# Patient Record
Sex: Female | Born: 1970 | Race: White | Hispanic: No | Marital: Married | Smoking: Never smoker
Health system: Southern US, Community
[De-identification: ages and names within clinical notes are randomized; demographics above are authoritative.]

## PROBLEM LIST (undated history)

## (undated) DIAGNOSIS — F329 Major depressive disorder, single episode, unspecified: Secondary | ICD-10-CM

## (undated) DIAGNOSIS — R0602 Shortness of breath: Secondary | ICD-10-CM

## (undated) DIAGNOSIS — E119 Type 2 diabetes mellitus without complications: Secondary | ICD-10-CM

## (undated) DIAGNOSIS — R5383 Other fatigue: Secondary | ICD-10-CM

## (undated) DIAGNOSIS — F419 Anxiety disorder, unspecified: Secondary | ICD-10-CM

## (undated) DIAGNOSIS — J45909 Unspecified asthma, uncomplicated: Secondary | ICD-10-CM

## (undated) DIAGNOSIS — C50919 Malignant neoplasm of unspecified site of unspecified female breast: Secondary | ICD-10-CM

## (undated) DIAGNOSIS — N649 Disorder of breast, unspecified: Secondary | ICD-10-CM

## (undated) DIAGNOSIS — N898 Other specified noninflammatory disorders of vagina: Secondary | ICD-10-CM

## (undated) DIAGNOSIS — F32A Depression, unspecified: Secondary | ICD-10-CM

## (undated) DIAGNOSIS — R0689 Other abnormalities of breathing: Secondary | ICD-10-CM

## (undated) DIAGNOSIS — D649 Anemia, unspecified: Secondary | ICD-10-CM

## (undated) DIAGNOSIS — I890 Lymphedema, not elsewhere classified: Secondary | ICD-10-CM

## (undated) DIAGNOSIS — Z9289 Personal history of other medical treatment: Secondary | ICD-10-CM

## (undated) DIAGNOSIS — M8580 Other specified disorders of bone density and structure, unspecified site: Secondary | ICD-10-CM

## (undated) HISTORY — DX: Anxiety disorder, unspecified: F41.9

## (undated) HISTORY — PX: BREAST RECONSTRUCTION: SHX9

## (undated) HISTORY — DX: Other abnormalities of breathing: R06.89

## (undated) HISTORY — DX: Depression, unspecified: F32.A

## (undated) HISTORY — DX: Other specified noninflammatory disorders of vagina: N89.8

## (undated) HISTORY — DX: Lymphedema, not elsewhere classified: I89.0

## (undated) HISTORY — DX: Other fatigue: R53.83

## (undated) HISTORY — DX: Shortness of breath: R06.02

## (undated) HISTORY — DX: Personal history of other medical treatment: Z92.89

## (undated) HISTORY — DX: Disorder of breast, unspecified: N64.9

## (undated) HISTORY — PX: CHOLECYSTECTOMY: SHX55

## (undated) HISTORY — PX: BROW LIFT: SHX178

## (undated) HISTORY — DX: Major depressive disorder, single episode, unspecified: F32.9

## (undated) HISTORY — DX: Anemia, unspecified: D64.9

## (undated) HISTORY — DX: Other specified disorders of bone density and structure, unspecified site: M85.80

## (undated) HISTORY — PX: WRIST SURGERY: SHX841

## (undated) HISTORY — PX: ABDOMINAL HYSTERECTOMY: SHX81

---

## 2010-09-14 ENCOUNTER — Emergency Department (HOSPITAL_COMMUNITY)
Admission: EM | Admit: 2010-09-14 | Discharge: 2010-09-14 | Payer: Self-pay | Source: Home / Self Care | Admitting: Emergency Medicine

## 2011-12-22 ENCOUNTER — Ambulatory Visit (INDEPENDENT_AMBULATORY_CARE_PROVIDER_SITE_OTHER): Payer: No Typology Code available for payment source

## 2011-12-22 DIAGNOSIS — M542 Cervicalgia: Secondary | ICD-10-CM

## 2012-01-09 ENCOUNTER — Other Ambulatory Visit: Payer: Self-pay | Admitting: Obstetrics and Gynecology

## 2012-01-09 DIAGNOSIS — Z1231 Encounter for screening mammogram for malignant neoplasm of breast: Secondary | ICD-10-CM

## 2012-01-29 ENCOUNTER — Ambulatory Visit
Admission: RE | Admit: 2012-01-29 | Discharge: 2012-01-29 | Disposition: A | Discharge status: Home / Self Care | Payer: BC Managed Care – PPO | Source: Ambulatory Visit | Attending: Obstetrics and Gynecology | Admitting: Obstetrics and Gynecology

## 2012-01-29 DIAGNOSIS — Z1231 Encounter for screening mammogram for malignant neoplasm of breast: Secondary | ICD-10-CM

## 2012-12-03 ENCOUNTER — Encounter (HOSPITAL_COMMUNITY): Payer: Self-pay | Admitting: Adult Health

## 2012-12-03 ENCOUNTER — Emergency Department (HOSPITAL_COMMUNITY)
Admission: EM | Admit: 2012-12-03 | Discharge: 2012-12-04 | Disposition: A | Discharge status: Home / Self Care | Payer: BC Managed Care – PPO | Attending: Emergency Medicine | Admitting: Emergency Medicine

## 2012-12-03 ENCOUNTER — Emergency Department (HOSPITAL_COMMUNITY): Payer: BC Managed Care – PPO

## 2012-12-03 DIAGNOSIS — M549 Dorsalgia, unspecified: Secondary | ICD-10-CM | POA: Insufficient documentation

## 2012-12-03 DIAGNOSIS — R630 Anorexia: Secondary | ICD-10-CM | POA: Insufficient documentation

## 2012-12-03 DIAGNOSIS — B9789 Other viral agents as the cause of diseases classified elsewhere: Secondary | ICD-10-CM | POA: Insufficient documentation

## 2012-12-03 DIAGNOSIS — IMO0001 Reserved for inherently not codable concepts without codable children: Secondary | ICD-10-CM | POA: Insufficient documentation

## 2012-12-03 DIAGNOSIS — R110 Nausea: Secondary | ICD-10-CM | POA: Insufficient documentation

## 2012-12-03 DIAGNOSIS — R05 Cough: Secondary | ICD-10-CM | POA: Insufficient documentation

## 2012-12-03 DIAGNOSIS — R0602 Shortness of breath: Secondary | ICD-10-CM | POA: Insufficient documentation

## 2012-12-03 DIAGNOSIS — B349 Viral infection, unspecified: Secondary | ICD-10-CM

## 2012-12-03 DIAGNOSIS — R059 Cough, unspecified: Secondary | ICD-10-CM | POA: Insufficient documentation

## 2012-12-03 DIAGNOSIS — R42 Dizziness and giddiness: Secondary | ICD-10-CM | POA: Insufficient documentation

## 2012-12-03 LAB — CBC
HCT: 41.5 % (ref 36.0–46.0)
Platelets: 300 10*3/uL (ref 150–400)
RBC: 4.4 MIL/uL (ref 3.87–5.11)
RDW: 11.9 % (ref 11.5–15.5)
WBC: 8.6 10*3/uL (ref 4.0–10.5)

## 2012-12-03 LAB — BASIC METABOLIC PANEL
BUN: 16 mg/dL (ref 6–23)
CO2: 23 mEq/L (ref 19–32)
Chloride: 99 mEq/L (ref 96–112)
GFR calc Af Amer: >90 mL/min (ref >90)
Potassium: 3.6 mEq/L (ref 3.5–5.1)

## 2012-12-03 LAB — POCT I-STAT TROPONIN I: Troponin i, poc: 0 ng/mL (ref 0.00–0.08)

## 2012-12-03 MED ORDER — ONDANSETRON HCL 4 MG/2ML IJ SOLN
4.0000 mg | Freq: Once | INTRAMUSCULAR | Status: AC
  Administered 2012-12-03: 4 mg via INTRAVENOUS
  Filled 2012-12-03: qty 2

## 2012-12-03 MED ORDER — KETOROLAC TROMETHAMINE 30 MG/ML IJ SOLN
30.0000 mg | Freq: Once | INTRAMUSCULAR | Status: AC
  Administered 2012-12-03: 30 mg via INTRAVENOUS
  Filled 2012-12-03: qty 1

## 2012-12-03 MED ORDER — OXYCODONE-ACETAMINOPHEN 5-325 MG PO TABS: 1.0000 | ORAL_TABLET | Freq: Four times a day (QID) | ORAL | Status: DC | PRN

## 2012-12-03 MED ORDER — MORPHINE SULFATE 4 MG/ML IJ SOLN
4.0000 mg | Freq: Once | INTRAMUSCULAR | Status: AC
  Administered 2012-12-03: 4 mg via INTRAVENOUS
  Filled 2012-12-03: qty 1

## 2012-12-03 MED ORDER — ONDANSETRON 8 MG PO TBDP: 8.0000 mg | ORAL_TABLET | Freq: Three times a day (TID) | ORAL | Status: DC | PRN

## 2012-12-03 MED ORDER — SODIUM CHLORIDE 0.9 % IV BOLUS (SEPSIS)
1000.0000 mL | Freq: Once | INTRAVENOUS | Status: AC
  Administered 2012-12-03: 1000 mL via INTRAVENOUS

## 2012-12-03 NOTE — ED Provider Notes (Signed)
History     CSN: [id number]  Arrival date & time 12/03/12  1911   First MD Initiated Contact with Patient 12/03/12 2135      Chief Complaint  Patient presents with  . Chest Pain    (Consider location/radiation/quality/duration/timing/severity/associated sxs/prior treatment) The history is provided by the patient.   patient's had pain in her upper back began on Thursday. She also has some upper chest pressure that began last night. She's felt lightheaded dizzy and has had coughing and nausea. She has not vomited. She has had decreased appetite. The chest pain does come and go. She was seen at an urgent care was sent here for further evaluation. She states that she feels bad all over. He feels as if she is just walking in space. No clear sick contacts. She's otherwise healthy. She does not smoke.  History reviewed. No pertinent past medical history.  History reviewed. No pertinent past surgical history.  History reviewed. No pertinent family history.  History  Substance Use Topics  . Smoking status: Never Smoker   . Smokeless tobacco: Not on file  . Alcohol Use: Yes    OB History    Grav Para Term Preterm Abortions TAB SAB Ect Mult Living                  Review of Systems  Constitutional: Positive for activity change and appetite change.  HENT: Negative for neck stiffness.   Eyes: Negative for pain.  Respiratory: Positive for shortness of breath. Negative for chest tightness.   Cardiovascular: Positive for chest pain. Negative for leg swelling.  Gastrointestinal: Positive for nausea. Negative for vomiting, abdominal pain and diarrhea.  Genitourinary: Negative for flank pain.  Musculoskeletal: Positive for myalgias and back pain.  Skin: Negative for rash.  Neurological: Negative for weakness, numbness and headaches.  Psychiatric/Behavioral: Negative for behavioral problems.    Allergies  Review of patient's allergies indicates no known allergies.  Home Medications     Current Outpatient Rx  Name  Route  Sig  Dispense  Refill  . ONDANSETRON 8 MG PO TBDP   Oral   Take 1 tablet (8 mg total) by mouth every 8 (eight) hours as needed for nausea.   20 tablet   0   . OXYCODONE-ACETAMINOPHEN 5-325 MG PO TABS   Oral   Take 1-2 tablets by mouth every 6 (six) hours as needed for pain.   10 tablet   0     BP 107/62  Pulse 97  Temp 98.4 F (36.9 C) (Oral)  Resp 16  Ht 5\' 2"  (1.575 m)  Wt 152 lb (68.947 kg)  BMI 27.80 kg/m2  SpO2 100%  Physical Exam  Nursing note and vitals reviewed. Constitutional: She is oriented to person, place, and time. She appears well-developed and well-nourished.       Patient appears somewhat uncomfortable.  HENT:  Head: Normocephalic and atraumatic.  Eyes: EOM are normal. Pupils are equal, round, and reactive to light.  Neck: Normal range of motion. Neck supple.  Cardiovascular: Regular rhythm and normal heart sounds.   No murmur heard.      Tachycardia  Pulmonary/Chest: Effort normal and breath sounds normal. No respiratory distress. She has no wheezes. She has no rales.  Abdominal: Soft. Bowel sounds are normal. She exhibits no distension. There is tenderness. There is no rebound and no guarding.       Mild epigastric tenderness without rebound or guarding. No right upper quadrant tenderness.  Musculoskeletal: Normal range  of motion.  Neurological: She is alert and oriented to person, place, and time. No cranial nerve deficit.  Skin: Skin is warm and dry.  Psychiatric: She has a normal mood and affect. Her speech is normal.    ED Course  Procedures (including critical care time)  Labs Reviewed  BASIC METABOLIC PANEL - Abnormal; Notable for the following:    Sodium 132 (*)     GFR calc non Af Amer 81 (*)     All other components within normal limits  CBC  POCT I-STAT TROPONIN I   Dg Chest 2 View  12/03/2012  *RADIOLOGY REPORT*  Clinical Data: Chest pain  CHEST - 2 VIEW  Comparison: None.  Findings:  Cardiomediastinal silhouette is unremarkable.  No acute infiltrate or pleural effusion.  No pulmonary edema.  Bony thorax is unremarkable.  IMPRESSION: No active disease.   Original Report Authenticated By: Natasha Mead, M.D.      1. Viral syndrome      Date: 12/03/2012  Rate: 99  Rhythm: normal sinus rhythm  QRS Axis: normal  Intervals: normal  ST/T Wave abnormalities: normal  Conduction Disutrbances: none  Narrative Interpretation: unremarkable     MDM  Patient with multiple complaints. Chest pain pressure myalgias lightheadedness dizziness cough nausea anorexia. Chest pain as squeezing and comes and goes. It is nonpleuritic. She is not on exogenous hormones and doesn't smoke. She has a mild tachycardia that has improved with IV fluids. She states she feels somewhat better after the first liter and the centimeters running at this time. Sodium was minimally low. Pending this is likely a start of a viral syndrome. EKG is reassuring I doubt cardiac cause. I doubt pulmonary embolism. She'll likely be discharged home and cares been turned over to Dr. Rulon Abide.        Juliet Rude. Rubin Payor, MD 12/03/12 [phone]

## 2012-12-03 NOTE — ED Notes (Addendum)
Presents with  Onset of back pain Thursday,  Sternal Chest pain and pressure that began last night associated with right arm tingling, diphoretic, lightheadedness, dizziness,cough, nausea and anorexia. Chest pain described as squeezing and intermittent. Pain began while in bed resting.  Today went to [facility] and sent here for further work up. The chest pain is not as severe today, but pt states, "I just don't feel good. Sometimes I feel like I am just walking in space."

## 2012-12-04 NOTE — ED Notes (Signed)
The patient is AOx4 and comfortable with her discharge instructions. 

## 2013-06-18 ENCOUNTER — Ambulatory Visit (INDEPENDENT_AMBULATORY_CARE_PROVIDER_SITE_OTHER): Payer: BC Managed Care – PPO | Admitting: Emergency Medicine

## 2013-06-18 ENCOUNTER — Ambulatory Visit: Payer: BC Managed Care – PPO

## 2013-06-18 VITALS — BP 120/80 | HR 84 | Temp 98.0°F | Resp 16 | Ht 62.0 in | Wt 185.0 lb

## 2013-06-18 DIAGNOSIS — M25561 Pain in right knee: Secondary | ICD-10-CM

## 2013-06-18 MED ORDER — HYDROCODONE-ACETAMINOPHEN 5-325 MG PO TABS: 1.0000 | ORAL_TABLET | ORAL | Status: DC | PRN

## 2013-06-18 NOTE — Progress Notes (Signed)
[facility] [address], [place] [address] [phone]  Date:  06/18/2013   Name:  Carrie Richards   DOB:  [birth year]   MRN:  [id number]  PCP:  Default, Provider, MD    Chief Complaint: Knee Pain   History of Present Illness:  Carrie Richards is a 42 y.o. very pleasant female patient who presents with the following:  Injured stepping into her closet and developed immediate pain in her medial knee and posterior knee.  Started three days ago and has persisted despite home remedies.  Swollen.  Not able to comfortably bear weight.  No improvement with over the counter medications or other home remedies. Denies other complaint or health concern today.   There are no active problems to display for this patient.   History reviewed. No pertinent past medical history.  Past Surgical History  Procedure Laterality Date  . Cesarean section    . Cholecystectomy    . Abdominal hysterectomy    . Wrist surgery Right     History  Substance Use Topics  . Smoking status: Never Smoker   . Smokeless tobacco: Not on file  . Alcohol Use: Yes    Family History  Problem Relation Age of Onset  . Diabetes Mother   . Cancer Mother   . Melanoma Father   . Hypertension Father   . Hyperlipidemia Father     No Known Allergies  Medication list has been reviewed and updated.  Current Outpatient Prescriptions on File Prior to Visit  Medication Sig Dispense Refill  . ondansetron (ZOFRAN-ODT) 8 MG disintegrating tablet Take 1 tablet (8 mg total) by mouth every 8 (eight) hours as needed for nausea.  20 tablet  0  . oxyCODONE-acetaminophen (PERCOCET/ROXICET) 5-325 MG per tablet Take 1-2 tablets by mouth every 6 (six) hours as needed for pain.  10 tablet  0   No current facility-administered medications on file prior to visit.    Review of Systems:  As per HPI, otherwise negative.    Physical Examination: Filed Vitals:   06/18/13 1935  BP: 120/80  Pulse: 84  Temp:  98 F (36.7 C)  Resp: 16   Filed Vitals:   06/18/13 1935  Height: 5\' 2"  (1.575 m)  Weight: 185 lb (83.915 kg)   Body mass index is 33.83 kg/(m^2). Ideal Body Weight: Weight in (lb) to have BMI = 25: 136.4   GEN: WDWN, NAD, Non-toxic, Alert & Oriented x 3 HEENT: Atraumatic, Normocephalic.  Ears and Nose: No external deformity. EXTR: No clubbing/cyanosis/edema NEURO: Normal gait.  PSYCH: Normally interactive. Conversant. Not depressed or anxious appearing.  Calm demeanor.  RIGHT knee:  Tender medial knee joint line and posteriorly in her distal thigh  Not able to extend fully.  Pain with flexion to 90 degrees.    Assessment and Plan: Knee injury Crutches Immobilizer   Signed,  Phillips Odor, MD   [facility] reading (PRIMARY) by  Dr. Dareen Piano.  Negative knee.

## 2013-11-20 ENCOUNTER — Ambulatory Visit (INDEPENDENT_AMBULATORY_CARE_PROVIDER_SITE_OTHER): Payer: BC Managed Care – PPO | Admitting: Internal Medicine

## 2013-11-20 VITALS — BP 120/80 | HR 83 | Temp 98.1°F | Resp 16 | Ht 62.0 in | Wt 182.0 lb

## 2013-11-20 DIAGNOSIS — S39012A Strain of muscle, fascia and tendon of lower back, initial encounter: Secondary | ICD-10-CM

## 2013-11-20 DIAGNOSIS — IMO0002 Reserved for concepts with insufficient information to code with codable children: Secondary | ICD-10-CM

## 2013-11-20 DIAGNOSIS — Z6833 Body mass index (BMI) 33.0-33.9, adult: Secondary | ICD-10-CM

## 2013-11-20 MED ORDER — CYCLOBENZAPRINE HCL 10 MG PO TABS: 10.0000 mg | ORAL_TABLET | Freq: Every day | ORAL | Status: DC

## 2013-11-20 MED ORDER — TRAMADOL HCL 50 MG PO TABS: ORAL_TABLET | ORAL | Status: DC

## 2013-11-20 MED ORDER — MELOXICAM 15 MG PO TABS: 15.0000 mg | ORAL_TABLET | Freq: Every day | ORAL | Status: DC

## 2013-11-20 NOTE — Progress Notes (Signed)
Subjective:    Patient ID: Carrie Richards, female    DOB: 06-19-1971, 42 y.o.   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 cyclobenzaprine (FLEXERIL) 10 MG tablet    Sig: Take 1 tablet (10 mg total) by mouth at bedtime.    Dispense:  20 tablet    Refill:  0  . meloxicam (MOBIC) 15 MG tablet    Sig: Take 1 tablet (15 mg total) by mouth daily.    Dispense:  30 tablet    Refill:  0  . traMADol (ULTRAM) 50 MG tablet    Sig: 2 tabs at bedtime if needed for pain    Dispense:  30 tablet    Refill:  0   Stretching exercises Needs weight loss

## 2014-05-31 ENCOUNTER — Ambulatory Visit (INDEPENDENT_AMBULATORY_CARE_PROVIDER_SITE_OTHER): Payer: BC Managed Care – PPO | Admitting: Physician Assistant

## 2014-05-31 VITALS — BP 126/72 | HR 91 | Temp 98.0°F | Resp 20 | Ht 63.25 in | Wt 187.2 lb

## 2014-05-31 DIAGNOSIS — S139XXA Sprain of joints and ligaments of unspecified parts of neck, initial encounter: Secondary | ICD-10-CM

## 2014-05-31 DIAGNOSIS — M62838 Other muscle spasm: Secondary | ICD-10-CM

## 2014-05-31 DIAGNOSIS — S161XXA Strain of muscle, fascia and tendon at neck level, initial encounter: Secondary | ICD-10-CM

## 2014-05-31 MED ORDER — CYCLOBENZAPRINE HCL 10 MG PO TABS: 10.0000 mg | ORAL_TABLET | Freq: Three times a day (TID) | ORAL | Status: DC | PRN

## 2014-05-31 MED ORDER — HYDROCODONE-ACETAMINOPHEN 5-325 MG PO TABS: 1.0000 | ORAL_TABLET | Freq: Three times a day (TID) | ORAL | Status: DC

## 2014-05-31 MED ORDER — DICLOFENAC SODIUM 75 MG PO TBEC: 75.0000 mg | DELAYED_RELEASE_TABLET | Freq: Two times a day (BID) | ORAL | Status: DC

## 2014-05-31 NOTE — Progress Notes (Signed)
Subjective:    Patient ID: Carrie Richards, female    DOB: 07-01-1971, 43 y.o.   MRN: [id number]  HPI Pt presents to clinic with left sided thoracic back pain for the last 3 days and left sided neck pain that started this morning. She has not had a injury that she is aware of.  She has not been sleeping great because of stress.  She has tried high dose motrin without much help.  She has h/o bulging lumbar discs that were helped with flexeril and she tolerated that well.  She has tried some heat and massage but it really hurts to turn her neck.  She is having no paresthesias in her L arm/hand.  OTC meds - ibuprofen 800mg  3x/day Right handed  Review of Systems     Objective:   Physical Exam  Vitals reviewed. Constitutional: She is oriented to person, place, and time. She appears well-developed and well-nourished.  HENT:  Head: Normocephalic and atraumatic.  Right Ear: External ear normal.  Left Ear: External ear normal.  Pulmonary/Chest: Effort normal.  Musculoskeletal:       Cervical back: She exhibits decreased range of motion (full flexion and extension but decrease rotation of neck 2nd to pain), tenderness, bony tenderness (mild cervical vertebra TTP C5-C7) and spasm (left sided).       Thoracic back: She exhibits spasm (left trapezius). She exhibits normal range of motion, no tenderness and no bony tenderness.  Neurological: She is alert and oriented to person, place, and time. She has normal strength. No cranial nerve deficit or sensory deficit.  Skin: Skin is warm and dry.  Psychiatric: She has a normal mood and affect. Her behavior is normal. Judgment and thought content normal.      Assessment & Plan:  Cervical strain, acute, initial encounter - Plan: diclofenac (VOLTAREN) 75 MG EC tablet, HYDROcodone-acetaminophen (NORCO/VICODIN) 5-325 MG per tablet  Trapezius muscle spasm - Plan: cyclobenzaprine (FLEXERIL) 10 MG tablet  Heat and stretches - she will f/u if there is no  improvement in 1 week or worsening of symptoms before then  Benny Lennert PA-C  [facility] 05/31/2014 9:18 PM

## 2014-07-26 ENCOUNTER — Ambulatory Visit (INDEPENDENT_AMBULATORY_CARE_PROVIDER_SITE_OTHER): Payer: BC Managed Care – PPO | Admitting: Family Medicine

## 2014-07-26 VITALS — BP 106/76 | HR 86 | Temp 97.7°F | Resp 20 | Ht 62.0 in | Wt 186.4 lb

## 2014-07-26 DIAGNOSIS — R300 Dysuria: Secondary | ICD-10-CM

## 2014-07-26 DIAGNOSIS — N3090 Cystitis, unspecified without hematuria: Secondary | ICD-10-CM

## 2014-07-26 LAB — POCT URINALYSIS DIPSTICK
BILIRUBIN UA: NEGATIVE
Glucose, UA: NEGATIVE
KETONES UA: NEGATIVE
NITRITE UA: NEGATIVE
PH UA: 5
Protein, UA: NEGATIVE
Urobilinogen, UA: 0.2

## 2014-07-26 LAB — POCT UA - MICROSCOPIC ONLY
CASTS, UR, LPF, POC: NEGATIVE
CRYSTALS, UR, HPF, POC: NEGATIVE
YEAST UA: NEGATIVE

## 2014-07-26 MED ORDER — PHENAZOPYRIDINE HCL 200 MG PO TABS: 200.0000 mg | ORAL_TABLET | Freq: Three times a day (TID) | ORAL | Status: DC | PRN

## 2014-07-26 MED ORDER — NITROFURANTOIN MONOHYD MACRO 100 MG PO CAPS: 100.0000 mg | ORAL_CAPSULE | Freq: Two times a day (BID) | ORAL | Status: DC

## 2014-07-26 NOTE — Progress Notes (Signed)
Subjective:    Patient ID: Carrie Richards, female    DOB: [birth year], 43 y.o.   MRN: [id number]  HPI Patient presents today with 1 day history of lower abdominal pain/pressure. Has had UTI in past. Last one 3-4 months ago. Years ago had frequent UTIs and had to see urology and was on maintenance antibiotics for several years. Watches her caffeine intake and urinates after intercourse.   Had thick white discharge last week for a couple of days. Resolved spontaneously. No itching. No odor.   Review of Systems +back pain, - fever/chills, no nausea/no vomiting    Objective:   Physical Exam  Vitals reviewed. Constitutional: She is oriented to person, place, and time. She appears well-developed and well-nourished.  HENT:  Head: Normocephalic and atraumatic.  Eyes: Conjunctivae are normal.  Neck: Normal range of motion. Neck supple.  Cardiovascular: Normal rate, regular rhythm and normal heart sounds.   Pulmonary/Chest: Effort normal and breath sounds normal.  Abdominal: Soft. Bowel sounds are normal. She exhibits no distension and no mass. There is tenderness in the suprapubic area. There is CVA tenderness. There is no rebound and no guarding.  Musculoskeletal: Normal range of motion.  Neurological: She is alert and oriented to person, place, and time.  Skin: Skin is warm and dry.  Psychiatric: She has a normal mood and affect. Her behavior is normal. Judgment and thought content normal.   -Patient reports it is not uncommon for her to have back pain and tenderness with cystitis.  Results for orders placed in visit on 07/26/14  POCT URINALYSIS DIPSTICK      Result Value Ref Range   Color, UA yellow     Clarity, UA cloudy     Glucose, UA neg     Bilirubin, UA neg     Ketones, UA neg     Spec Grav, UA <=1.005     Blood, UA mod     pH, UA 5.0     Protein, UA neg     Urobilinogen, UA 0.2     Nitrite, UA neg     Leukocytes, UA moderate (2+)    POCT UA - MICROSCOPIC ONLY      Result  Value Ref Range   WBC, Ur, HPF, POC tntc     RBC, urine, microscopic tntc     Bacteria, U Microscopic 1+     Mucus, UA trace     Epithelial cells, urine per micros 0-2     Crystals, Ur, HPF, POC neg     Casts, Ur, LPF, POC neg     Yeast, UA neg         Assessment & Plan:  1. Dysuria - POCT urinalysis dipstick - POCT UA - Microscopic Only  2. Cystitis - nitrofurantoin, macrocrystal-monohydrate, (MACROBID) 100 MG capsule; Take 1 capsule (100 mg total) by mouth 2 (two) times daily.  Dispense: 20 capsule; Refill: 0 - phenazopyridine (PYRIDIUM) 200 MG tablet; Take 1 tablet (200 mg total) by mouth 3 (three) times daily as needed for pain.  Dispense: 10 tablet; Refill: 0 - Urine culture  -discussed importance of immediate follow up if she is not improving in 48 hours, sooner if worsening or increased pain/fever/nause/vomiting.  Emi Belfast, FNP-BC  [facility]  07/26/2014 9:52 PM

## 2014-07-26 NOTE — Patient Instructions (Signed)

## 2014-07-28 LAB — URINE CULTURE
COLONY COUNT: NO GROWTH
Organism ID, Bacteria: NO GROWTH

## 2014-10-06 ENCOUNTER — Ambulatory Visit (INDEPENDENT_AMBULATORY_CARE_PROVIDER_SITE_OTHER): Payer: BC Managed Care – PPO | Admitting: Emergency Medicine

## 2014-10-06 VITALS — BP 108/70 | HR 92 | Temp 98.8°F | Resp 18 | Ht 62.0 in | Wt 192.8 lb

## 2014-10-06 DIAGNOSIS — K219 Gastro-esophageal reflux disease without esophagitis: Secondary | ICD-10-CM

## 2014-10-06 DIAGNOSIS — R079 Chest pain, unspecified: Secondary | ICD-10-CM

## 2014-10-06 MED ORDER — SUCRALFATE 1 G PO TABS: ORAL_TABLET | ORAL | Status: DC

## 2014-10-06 MED ORDER — GI COCKTAIL ~~LOC~~
30.0000 mL | Freq: Once | ORAL | Status: AC
  Administered 2014-10-06: 30 mL via ORAL

## 2014-10-06 MED ORDER — ESOMEPRAZOLE MAGNESIUM 40 MG PO CPDR: 40.0000 mg | DELAYED_RELEASE_CAPSULE | Freq: Every day | ORAL | Status: DC

## 2014-10-06 NOTE — Patient Instructions (Signed)
Gastroesophageal Reflux Disease, Adult Gastroesophageal reflux disease (GERD) happens when acid from your stomach flows up into the esophagus. When acid comes in contact with the esophagus, the acid causes soreness (inflammation) in the esophagus. Over time, GERD may create small holes (ulcers) in the lining of the esophagus. CAUSES   Increased body weight. This puts pressure on the stomach, making acid rise from the stomach into the esophagus.  Smoking. This increases acid production in the stomach.  Drinking alcohol. This causes decreased pressure in the lower esophageal sphincter (valve or ring of muscle between the esophagus and stomach), allowing acid from the stomach into the esophagus.  Late evening meals and a full stomach. This increases pressure and acid production in the stomach.  A malformed lower esophageal sphincter. Sometimes, no cause is found. SYMPTOMS   Burning pain in the lower part of the mid-chest behind the breastbone and in the mid-stomach area. This may occur twice a week or more often.  Trouble swallowing.  Sore throat.  Dry cough.  Asthma-like symptoms including chest tightness, shortness of breath, or wheezing. DIAGNOSIS  Your caregiver may be able to diagnose GERD based on your symptoms. In some cases, X-rays and other tests may be done to check for complications or to check the condition of your stomach and esophagus. TREATMENT  Your caregiver may recommend over-the-counter or prescription medicines to help decrease acid production. Ask your caregiver before starting or adding any new medicines.  HOME CARE INSTRUCTIONS   Change the factors that you can control. Ask your caregiver for guidance concerning weight loss, quitting smoking, and alcohol consumption.  Avoid foods and drinks that make your symptoms worse, such as:  Caffeine or alcoholic drinks.  Chocolate.  Peppermint or mint flavorings.  Garlic and onions.  Spicy foods.  Citrus fruits,  such as oranges, lemons, or limes.  Tomato-based foods such as sauce, chili, salsa, and pizza.  Fried and fatty foods.  Avoid lying down for the 3 hours prior to your bedtime or prior to taking a nap.  Eat small, frequent meals instead of large meals.  Wear loose-fitting clothing. Do not wear anything tight around your waist that causes pressure on your stomach.  Raise the head of your bed 6 to 8 inches with wood blocks to help you sleep. Extra pillows will not help.  Only take over-the-counter or prescription medicines for pain, discomfort, or fever as directed by your caregiver.  Do not take aspirin, ibuprofen, or other nonsteroidal anti-inflammatory drugs (NSAIDs). SEEK IMMEDIATE MEDICAL CARE IF:   You have pain in your arms, neck, jaw, teeth, or back.  Your pain increases or changes in intensity or duration.  You develop nausea, vomiting, or sweating (diaphoresis).  You develop shortness of breath, or you faint.  Your vomit is green, yellow, black, or looks like coffee grounds or blood.  Your stool is red, bloody, or black. These symptoms could be signs of other problems, such as heart disease, gastric bleeding, or esophageal bleeding. MAKE SURE YOU:   Understand these instructions.  Will watch your condition.  Will get help right away if you are not doing well or get worse. Document Released: 08/22/2005 Document Revised: 02/04/2012 Document Reviewed: 06/01/2011 Exit[employer] Patient Information 2015 [employer]. This information is not intended to replace advice given to you by your health care provider. Make sure you discuss any questions you have with your health care provider.  

## 2014-10-06 NOTE — Progress Notes (Signed)
[facility] [address] [place] [address] [phone]  Date:  10/06/2014   Name:  Carrie Richards   DOB:  12/26/1970   MRN:  [id number]  PCP:  Default, Provider, MD    Chief Complaint: Gastrophageal Reflux; Palpitations; and Shoulder Pain   History of Present Illness:  Carrie Richards is a 43 y.o. very pleasant female patient who presents with the following:  Developed a burning in her chest Sunday evening.  Worse with recumbency and bending over  Not affected by eating. No appetite.  No nausea or vomiting.  Some waterbrash. Non smoker. No caffeine.  No excess NSAID.  Scant drinker. Grandmother MI at 74. Hysterectomy.  Has ovaries No NIDDM, HBP or HLD. Sedentary lifestyle Attempted relief with tums with no improvement. Denies other complaint or health concern today.   Patient Active Problem List   Diagnosis Date Noted  . BMI 33.0-33.9,adult 11/20/2013    History reviewed. No pertinent past medical history.  Past Surgical History  Procedure Laterality Date  . Cesarean section    . Cholecystectomy    . Abdominal hysterectomy    . Wrist surgery Right     History  Substance Use Topics  . Smoking status: Never Smoker   . Smokeless tobacco: Never Used  . Alcohol Use: Yes    Family History  Problem Relation Age of Onset  . Diabetes Mother   . Cancer Mother   . Melanoma Father   . Hypertension Father   . Hyperlipidemia Father     No Known Allergies  Medication list has been reviewed and updated.  No current outpatient prescriptions on file prior to visit.   No current facility-administered medications on file prior to visit.    Review of Systems:  As per HPI, otherwise negative.    Physical Examination: Filed Vitals:   10/06/14 1436  BP: 108/70  Pulse: 92  Temp: 98.8 F (37.1 C)  Resp: 18   Filed Vitals:   10/06/14 1436  Height: 5\' 2"  (1.575 m)  Weight: 192 lb 12.8 oz (87.454 kg)   Body mass index is 35.25  kg/(m^2). Ideal Body Weight: Weight in (lb) to have BMI = 25: 136.4  GEN: obese, NAD, Non-toxic, A & O x 3 HEENT: Atraumatic, Normocephalic. Neck supple. No masses, No LAD. Ears and Nose: No external deformity. CV: RRR, No M/G/R. No JVD. No thrill. No extra heart sounds. PULM: CTA B, no wheezes, crackles, rhonchi. No retractions. No resp. distress. No accessory muscle use. ABD: S, NT, ND, +BS. No rebound. No HSM. EXTR: No c/c/e NEURO Normal gait.  PSYCH: Normally interactive. Conversant. Not depressed or anxious appearing.  Calm demeanor.    Assessment and Plan: Chest pain GERD nexium carafate  Signed,  Ellison Carwin, MD

## 2014-10-10 ENCOUNTER — Ambulatory Visit (INDEPENDENT_AMBULATORY_CARE_PROVIDER_SITE_OTHER): Payer: BC Managed Care – PPO

## 2014-10-10 ENCOUNTER — Ambulatory Visit (INDEPENDENT_AMBULATORY_CARE_PROVIDER_SITE_OTHER): Payer: BC Managed Care – PPO | Admitting: Internal Medicine

## 2014-10-10 VITALS — BP 110/70 | HR 84 | Temp 98.5°F | Resp 16 | Ht 63.25 in | Wt 192.0 lb

## 2014-10-10 DIAGNOSIS — M542 Cervicalgia: Secondary | ICD-10-CM

## 2014-10-10 DIAGNOSIS — M47812 Spondylosis without myelopathy or radiculopathy, cervical region: Secondary | ICD-10-CM

## 2014-10-10 DIAGNOSIS — C44311 Basal cell carcinoma of skin of nose: Secondary | ICD-10-CM

## 2014-10-10 MED ORDER — MELOXICAM 15 MG PO TABS: 15.0000 mg | ORAL_TABLET | Freq: Every day | ORAL | Status: DC

## 2014-10-10 MED ORDER — CYCLOBENZAPRINE HCL 10 MG PO TABS: 10.0000 mg | ORAL_TABLET | Freq: Every day | ORAL | Status: DC

## 2014-10-10 NOTE — Progress Notes (Addendum)
Subjective:    Patient ID: Carrie Richards, female    DOB: [birth year], 43 y.o.   MRN: [id number] This chart was scribed for Ellamae Sia, MD by Jolene Provost, Medical Scribe. This patient was seen in Room 9 and the patient's care was started a 2:47 PM.   HPI  HPI Comments: Carrie Richards is a 43 y.o. female who presents to [facility] complaining of constant neck pain, worse on right, that started yesterdayupon awakening. Pt states that she woke up after a nap yesterday with neck pain. Pt endorses associated right shoulder pain on-and-off for 6 months. Pt denies numbness or tingling or weakness in arms or hands. Pt reported to [facility] 06/20/2014 for the same symptoms and was treated with muscle relaxers with relief of her symptoms. Pt did not receive a neck x-ray on previous exam.  No prior injury reported regarding neck.  Sleeps with 2 pillows. Copy with lots of computer time. Prior to the onset of shoulder pain she did have a suspicious lifting injury right shoulder   Review of Systems  Musculoskeletal: Positive for neck pain.  she is on Nexium for reflux     Objective:   Physical Exam  Constitutional: She is oriented to person, place, and time. She appears well-developed and well-nourished.  HENT:  Head: Normocephalic and atraumatic.  Eyes: EOM are normal. Pupils are equal, round, and reactive to light.  Neck: No thyromegaly present.  The neck is tender to palpation in the right paracervical and right trapezius Range of motion is reduced in all directions secondary to pain   Cardiovascular: Normal rate and regular rhythm.   Pulmonary/Chest: Effort normal.  Musculoskeletal:  Right shoulder is tender to palpation over the anterior deltoid area. Abduction greater than 90 is painful. Abduction against resistance painful. External rotation is painful. ADD duction within normal limits  Lymphadenopathy:    She has no cervical adenopathy.  Neurological: She is alert and  oriented to person, place, and time. No cranial nerve deficit.  There are no motor or sensory losses in the upper extremities. Deep tendon reflexes are symmetrical  Skin: Skin is warm and dry.  Psychiatric: She has a normal mood and affect. Her behavior is normal.  Nursing note and vitals reviewed.  [facility] reading (PRIMARY) by  Dr.Tyeson Tanimoto-=Loss of normal lordosis/narrowing at C4-5.      Assessment & Plan:   I have completed the patient encounter in its entirety as documented by the scribe, with editing by me where necessary. Clever Geraldo P. Merla Riches, M.D.  Problem #1 neck pain with mild radicular component probably stimulated by work and sleep posture  Problem #2 shoulder pain secondary to shoulder strain from an injury with resulting tendinitis  Plan Meloxicam and Flexeril at bedtime for 1 month Handout for neck regarding posture, work place modifications, and range of motion exercises Range of motion exercises for right shoulder  Follow-up 2 weeks if not improving

## 2014-10-31 ENCOUNTER — Other Ambulatory Visit: Payer: Self-pay | Admitting: Emergency Medicine

## 2015-02-01 ENCOUNTER — Ambulatory Visit (INDEPENDENT_AMBULATORY_CARE_PROVIDER_SITE_OTHER): Payer: BLUE CROSS/BLUE SHIELD | Admitting: Physician Assistant

## 2015-02-01 VITALS — BP 124/72 | HR 93 | Temp 97.8°F | Resp 18 | Ht 63.0 in | Wt 180.0 lb

## 2015-02-01 DIAGNOSIS — R058 Other specified cough: Secondary | ICD-10-CM

## 2015-02-01 DIAGNOSIS — R05 Cough: Secondary | ICD-10-CM

## 2015-02-01 MED ORDER — HYDROCOD POLST-CHLORPHEN POLST 10-8 MG/5ML PO LQCR: 5.0000 mL | Freq: Two times a day (BID) | ORAL | Status: DC | PRN

## 2015-02-01 MED ORDER — BENZONATATE 100 MG PO CAPS: 100.0000 mg | ORAL_CAPSULE | Freq: Three times a day (TID) | ORAL | Status: DC | PRN

## 2015-02-01 NOTE — Progress Notes (Signed)
Subjective:    Patient ID: Carrie Richards, female    DOB: 04-May-1971, 44 y.o.   MRN: [id number]  HPI Patient presents for cough that has been present since she was ill over a week ago. Is no longer experiencing sx of fever, HA, sore throat, rhinorrhea, and sinus pressure. Cough is throughout day and worse at night. Has many coughing fits. Is not taking anything for cough currently. Denies h/o asthma, allergies, or smoking. No known sick contacts.    Review of Systems  Constitutional: Negative for fever, chills and fatigue.  HENT: Negative for congestion, ear discharge, ear pain, postnasal drip, rhinorrhea, sinus pressure, sneezing and sore throat.   Eyes: Negative.   Respiratory: Positive for cough. Negative for shortness of breath and wheezing.   Cardiovascular: Negative for chest pain.  Gastrointestinal: Negative for nausea.  Neurological: Negative for dizziness and headaches.       Objective:   Physical Exam  Constitutional: She is oriented to person, place, and time. She appears well-developed and well-nourished. No distress.  Blood pressure 124/72, pulse 93, temperature 97.8 F (36.6 C), temperature source Oral, resp. rate 18, height 5\' 3"  (1.6 m), weight 180 lb (81.647 kg), SpO2 97 %.  HENT:  Head: Normocephalic and atraumatic.  Right Ear: Tympanic membrane, external ear and ear canal normal.  Left Ear: Tympanic membrane, external ear and ear canal normal.  Nose: Nose normal. Right sinus exhibits no maxillary sinus tenderness and no frontal sinus tenderness. Left sinus exhibits no maxillary sinus tenderness and no frontal sinus tenderness.  Mouth/Throat: Oropharynx is clear and moist. No oropharyngeal exudate.  Eyes: Conjunctivae and EOM are normal. Pupils are equal, round, and reactive to light. Right eye exhibits no discharge. Left eye exhibits no discharge. No scleral icterus.  Neck: Normal range of motion. Neck supple. No thyromegaly present.  Cardiovascular: Normal rate,  regular rhythm and normal heart sounds.  Exam reveals no gallop and no friction rub.   No murmur heard. Pulmonary/Chest: Effort normal and breath sounds normal. No respiratory distress. She has no decreased breath sounds. She has no wheezes. She has no rhonchi. She has no rales. She exhibits no tenderness.  Abdominal: Soft. Bowel sounds are normal. There is no tenderness.  Lymphadenopathy:    She has no cervical adenopathy.  Neurological: She is alert and oriented to person, place, and time.  Skin: Skin is warm and dry. No rash noted. She is not diaphoretic. No erythema. No pallor.  Psychiatric: Her behavior is normal. Thought content normal.       Assessment & Plan:  1. Post-viral cough syndrome - benzonatate (TESSALON) 100 MG capsule; Take 1-2 capsules (100-200 mg total) by mouth 3 (three) times daily as needed for cough.  Dispense: 40 capsule; Refill: 0 - chlorpheniramine-HYDROcodone (TUSSIONEX PENNKINETIC ER) 10-8 MG/5ML LQCR; Take 5 mLs by mouth every 12 (twelve) hours as needed for cough (cough).  Dispense: 60 mL; Refill: 0   Stone Spirito PA-C  [facility] 02/01/2015 4:05 PM

## 2015-04-20 ENCOUNTER — Other Ambulatory Visit: Payer: Self-pay

## 2015-04-20 DIAGNOSIS — Z1231 Encounter for screening mammogram for malignant neoplasm of breast: Secondary | ICD-10-CM

## 2015-05-03 ENCOUNTER — Ambulatory Visit
Admission: RE | Admit: 2015-05-03 | Discharge: 2015-05-03 | Disposition: A | Discharge status: Home / Self Care | Payer: BLUE CROSS/BLUE SHIELD | Source: Ambulatory Visit

## 2015-05-03 DIAGNOSIS — Z1231 Encounter for screening mammogram for malignant neoplasm of breast: Secondary | ICD-10-CM

## 2015-06-18 ENCOUNTER — Ambulatory Visit (INDEPENDENT_AMBULATORY_CARE_PROVIDER_SITE_OTHER): Payer: BLUE CROSS/BLUE SHIELD | Admitting: Family Medicine

## 2015-06-18 VITALS — BP 118/76 | HR 102 | Temp 98.2°F | Resp 18 | Ht 63.0 in | Wt 159.6 lb

## 2015-06-18 DIAGNOSIS — R300 Dysuria: Secondary | ICD-10-CM

## 2015-06-18 LAB — POCT URINALYSIS DIPSTICK
Glucose, UA: NEGATIVE
Nitrite, UA: NEGATIVE
Protein, UA: NEGATIVE
Spec Grav, UA: 1.015
Urobilinogen, UA: 1
pH, UA: 5.5

## 2015-06-18 LAB — POCT UA - MICROSCOPIC ONLY
Casts, Ur, LPF, POC: NEGATIVE
Crystals, Ur, HPF, POC: NEGATIVE
Yeast, UA: NEGATIVE

## 2015-06-18 MED ORDER — CIPROFLOXACIN HCL 250 MG PO TABS: 250.0000 mg | ORAL_TABLET | Freq: Two times a day (BID) | ORAL | Status: DC

## 2015-06-18 NOTE — Progress Notes (Signed)
Patient ID: Carrie Richards MRN: [id number], DOB: 1971-09-15, 44 y.o. Date of Encounter: 06/18/2015, 1:25 PM  Primary Physician: Default, Provider, MD  Chief Complaint:  Chief Complaint  Patient presents with  . Urinary Frequency    pt thinks its a bladder infection no pain   . Vaginal Discharge    HPI: 44 y.o. year old female presents with 1 day history of dysuria, urgency, and frequency. Last UTI was 4-6 months ago No hematuria LMP:  10 years ago before hysterectomy No sick contacts, recent antibiotics, or recent travels.   No vaginal discharge, back pain, fever  Patient has h/o UTI, most recently 4-6 months ago  Patient works in Presenter, broadcasting.  Very stressed.  Not sleeping well  Home Meds: Prior to Admission medications   Medication Sig Start Date End Date Taking? Authorizing Provider  benzonatate (TESSALON) 100 MG capsule Take 1-2 capsules (100-200 mg total) by mouth 3 (three) times daily as needed for cough. 02/01/15   Tishira R Brewington, PA-C  chlorpheniramine-HYDROcodone (TUSSIONEX PENNKINETIC ER) 10-8 MG/5ML LQCR Take 5 mLs by mouth every 12 (twelve) hours as needed for cough (cough). 02/01/15   Tishira R Brewington, PA-C  cyclobenzaprine (FLEXERIL) 10 MG tablet Take 1 tablet (10 mg total) by mouth at bedtime. Patient not taking: Reported on 02/01/2015 10/10/14   Tonye Pearson, MD  meloxicam (MOBIC) 15 MG tablet Take 1 tablet (15 mg total) by mouth daily. Patient not taking: Reported on 02/01/2015 10/10/14   Tonye Pearson, MD  NEXIUM 40 MG capsule TAKE ONE CAPSULE BY MOUTH EVERY DAY AT NOON Patient not taking: Reported on 02/01/2015 10/31/14   Carmelina Dane, MD  sucralfate (CARAFATE) 1 G tablet TAKE 1 TABLET BY MOUTH 1 HOUR PRIOR TO MEALS AND AT BEDTIME Patient not taking: Reported on 02/01/2015 10/31/14   Carmelina Dane, MD    Allergies: No Known Allergies  History   Social History  . Marital Status: Married    Spouse Name: N/A  . Number of Children:  N/A  . Years of Education: N/A   Occupational History  . Not on file.   Social History Main Topics  . Smoking status: Never Smoker   . Smokeless tobacco: Never Used  . Alcohol Use: Yes  . Drug Use: No  . Sexual Activity: Not on file   Other Topics Concern  . Not on file   Social History Narrative     Review of Systems: Constitutional: negative for chills, fever, night sweats or weight changes Cardiovascular: negative for chest pain or palpitations Respiratory: negative for hemoptysis, wheezing, or shortness of breath Abdominal: negative for abdominal pain, nausea, vomiting or diarrhea Dermatological: negative for rash Neurologic: negative for headache   Physical Exam: Blood pressure 118/76, pulse 102, temperature 98.2 F (36.8 C), temperature source Oral, resp. rate 18, height 5\' 3"  (1.6 m), weight 159 lb 9.6 oz (72.394 kg), SpO2 99 %., Body mass index is 28.28 kg/(m^2). General: Well developed, well nourished, in no acute distress. Head: Normocephalic, atraumatic, eyes without discharge, sclera non-icteric, nares are congested. Bilateral auditory canals clear, TM's are without perforation, pearly grey with reflective cone of light bilaterally. Serous effusion bilaterally behind TM's. Maxillary sinus TTP. Oral cavity moist, dentition normal. Posterior pharynx with post nasal drip and mild erythema. No peritonsillar abscess or tonsillar exudate. Neck: Supple. No thyromegaly. Full ROM. No lymphadenopathy. Lungs: Coarse breath sounds bilaterally without Clear bilaterally to auscultation without wheezes, rales, or rhonchi. Breathing is unlabored.  Heart:  RRR with S1 S2. No murmurs, rubs, or gallops appreciated. Abdomen: Soft, non-tender, non-distended with normoactive bowel sounds. No hepatosplenomegaly. No rebound/guarding. No obvious abdominal masses. McBurney's, Rovsing's, Iliopsoas, and table jar all negative. Msk:  Strength and tone normal for age. Extremities: No clubbing or  cyanosis. No edema. Neuro: Alert and oriented X 3. Moves all extremities spontaneously. CNII-XII grossly in tact. Psych:  Responds to questions appropriately with a normal affect.   Labs: Results for orders placed or performed in visit on 07/26/14  Urine culture  Result Value Ref Range   Colony Count NO GROWTH    Organism ID, Bacteria NO GROWTH   POCT urinalysis dipstick  Result Value Ref Range   Color, UA yellow    Clarity, UA cloudy    Glucose, UA neg    Bilirubin, UA neg    Ketones, UA neg    Spec Grav, UA <=1.005    Blood, UA mod    pH, UA 5.0    Protein, UA neg    Urobilinogen, UA 0.2    Nitrite, UA neg    Leukocytes, UA moderate (2+)   POCT UA - Microscopic Only  Result Value Ref Range   WBC, Ur, HPF, POC tntc    RBC, urine, microscopic tntc    Bacteria, U Microscopic 1+    Mucus, UA trace    Epithelial cells, urine per micros 0-2    Crystals, Ur, HPF, POC neg    Casts, Ur, LPF, POC neg    Yeast, UA neg    Results for orders placed or performed in visit on 06/18/15  POCT urinalysis dipstick  Result Value Ref Range   Color, UA yellow    Clarity, UA clear    Glucose, UA negative    Bilirubin, UA small    Ketones, UA trace    Spec Grav, UA 1.015    Blood, UA small    pH, UA 5.5    Protein, UA negative    Urobilinogen, UA 1.0    Nitrite, UA negative    Leukocytes, UA Trace (A) Negative  POCT UA - Microscopic Only  Result Value Ref Range   WBC, Ur, HPF, POC 5-10    RBC, urine, microscopic 0-3    Bacteria, U Microscopic 2+    Mucus, UA moderate    Epithelial cells, urine per micros 1-5    Crystals, Ur, HPF, POC negative    Casts, Ur, LPF, POC negative    Yeast, UA negative       ASSESSMENT AND PLAN:  44 y.o. year old female with  - No diagnosis found. -Tylenol/Motrin prn -Rest/fluids -RTC precautions -RTC 3-5 days if no improvement  Signed, Elvina Sidle, MD 06/18/2015 1:25 PM

## 2015-06-18 NOTE — Patient Instructions (Signed)

## 2015-06-19 LAB — URINE CULTURE
Colony Count: NO GROWTH
Organism ID, Bacteria: NO GROWTH

## 2015-06-21 ENCOUNTER — Telehealth: Payer: Self-pay

## 2015-06-21 NOTE — Telephone Encounter (Signed)
Pt called in and needs a refill for Cipro-she is in [place]. [facility] can [facility] [address] and they will transfer it to [place].she can be reached @ [phone]. She was just here

## 2015-06-21 NOTE — Telephone Encounter (Signed)
Patient has finished her cipro medication for her bladder infection and still having symptoms. Patient is requesting another round to clear it up. She feels she needs "Macrobid" instead of the cipro. She has taken Macrobid before for a bladder infection and it cleared up right away. She is currently in [place] and is unable to RTC. She is doesn't know the [facility] she is but request [facility] to be [facility] [address] and she will have them transfer it. I informed her Dr Joseph Art was off the next couple of days and I wasn't sure if another provider would authorize it. Patients call back number is [phone].

## 2015-06-22 MED ORDER — NITROFURANTOIN MONOHYD MACRO 100 MG PO CAPS: 100.0000 mg | ORAL_CAPSULE | Freq: Two times a day (BID) | ORAL | Status: DC

## 2015-06-22 NOTE — Telephone Encounter (Signed)
Urine culture showed no growth, so no sensitivities available. Will try Macrobid since this worked for her in the past. [facility] [address] as requested. If she does not have improvement in the next 3-5 days, she needs to be seen at a clinic for re-evaluation.

## 2015-06-23 NOTE — Telephone Encounter (Signed)
LMOM of Carrie Richards's message

## 2016-03-03 DIAGNOSIS — N390 Urinary tract infection, site not specified: Secondary | ICD-10-CM | POA: Diagnosis not present

## 2016-04-10 ENCOUNTER — Other Ambulatory Visit: Payer: Self-pay

## 2016-04-10 DIAGNOSIS — Z1231 Encounter for screening mammogram for malignant neoplasm of breast: Secondary | ICD-10-CM

## 2016-05-03 ENCOUNTER — Ambulatory Visit: Payer: BLUE CROSS/BLUE SHIELD

## 2016-06-19 ENCOUNTER — Other Ambulatory Visit: Payer: Self-pay | Admitting: Obstetrics and Gynecology

## 2016-06-19 ENCOUNTER — Ambulatory Visit: Payer: BLUE CROSS/BLUE SHIELD

## 2016-06-19 ENCOUNTER — Ambulatory Visit
Admission: RE | Admit: 2016-06-19 | Discharge: 2016-06-19 | Disposition: A | Discharge status: Home / Self Care | Payer: BLUE CROSS/BLUE SHIELD | Source: Ambulatory Visit

## 2016-06-19 DIAGNOSIS — Z1231 Encounter for screening mammogram for malignant neoplasm of breast: Secondary | ICD-10-CM

## 2016-06-20 DIAGNOSIS — M62830 Muscle spasm of back: Secondary | ICD-10-CM | POA: Diagnosis not present

## 2016-06-20 DIAGNOSIS — M545 Low back pain: Secondary | ICD-10-CM | POA: Diagnosis not present

## 2016-06-20 DIAGNOSIS — Z8739 Personal history of other diseases of the musculoskeletal system and connective tissue: Secondary | ICD-10-CM | POA: Diagnosis not present

## 2016-08-06 DIAGNOSIS — R300 Dysuria: Secondary | ICD-10-CM | POA: Diagnosis not present

## 2017-06-11 ENCOUNTER — Ambulatory Visit (INDEPENDENT_AMBULATORY_CARE_PROVIDER_SITE_OTHER): Payer: BLUE CROSS/BLUE SHIELD | Admitting: Family Medicine

## 2017-06-11 ENCOUNTER — Other Ambulatory Visit: Payer: Self-pay | Admitting: Surgical

## 2017-06-11 ENCOUNTER — Encounter: Payer: Self-pay | Admitting: Surgical

## 2017-06-11 ENCOUNTER — Other Ambulatory Visit (HOSPITAL_COMMUNITY)
Admission: RE | Admit: 2017-06-11 | Discharge: 2017-06-11 | Disposition: A | Discharge status: Home / Self Care | Payer: BLUE CROSS/BLUE SHIELD | Source: Ambulatory Visit | Attending: Family Medicine | Admitting: Family Medicine

## 2017-06-11 ENCOUNTER — Telehealth: Payer: Self-pay

## 2017-06-11 VITALS — BP 144/80 | HR 91 | Temp 98.3°F | Ht 63.0 in | Wt 193.2 lb

## 2017-06-11 DIAGNOSIS — F329 Major depressive disorder, single episode, unspecified: Secondary | ICD-10-CM

## 2017-06-11 DIAGNOSIS — N898 Other specified noninflammatory disorders of vagina: Secondary | ICD-10-CM | POA: Insufficient documentation

## 2017-06-11 DIAGNOSIS — K219 Gastro-esophageal reflux disease without esophagitis: Secondary | ICD-10-CM

## 2017-06-11 DIAGNOSIS — E669 Obesity, unspecified: Secondary | ICD-10-CM

## 2017-06-11 MED ORDER — SERTRALINE HCL 25 MG PO TABS: 25.0000 mg | ORAL_TABLET | Freq: Every day | ORAL | 0 refills | Status: DC

## 2017-06-11 MED ORDER — LORAZEPAM 0.5 MG PO TABS: 0.5000 mg | ORAL_TABLET | Freq: Two times a day (BID) | ORAL | 1 refills | Status: DC | PRN

## 2017-06-11 NOTE — Progress Notes (Signed)
Carrie Richards is a 46 y.o. female is here to [place].   Patient Care Team: Briscoe Deutscher, DO as PCP - General (Family Medicine)   History of Present Illness:   Elmon Kirschner CMA acting as scribe for Dr. Juleen China.   CC: Patient present today to establish care and complains of thick white discharge for 5 years. Also expresses anxiety and depression.  HPI:  1. Reactive depression. Patient endorses a history of anxiety and depression that waxes and wanes. 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 She cannot remember which medication she took at that time. 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 She also has 6 children. She takes little time for herself. Her sister and children have recently moved into her home. There have been some family issues that are weighing heavily on her. She feels that she is struggling. She presents today with her husband - obviously very supportive. She is tearful throughout the encounter. She asks for both medication and therapy today. She is willing to take time off of work and focus on self-care at this point.    2. Vaginal discharge. Ongoing. She states that this is been an issue since she gave birth to her last child 14 years ago. It is generally white and thick. Her husband notices that this is an issue as well. she has had a hysterectomy. She is status post 4 C-sections prior to that. No history of STDs. She denies any douches or vaginal creams. Denies any itching or odor. She sometimes has some pelvic discomfort and bloating. No overt bowel changes. No incontinence. No dysuria. She has had this evaluated in the past. Sounds like she may have been diagnosed with BV once or twice.    Health Maintenance Due  Topic Date Due  . HIV Screening  07/31/1986  . TETANUS/TDAP  07/31/1990   Depression screen PHQ 2/9 06/11/2017  Decreased Interest 3  Down, Depressed, Hopeless 3  PHQ - 2 Score 6  Altered sleeping 1  Tired,  decreased energy 3  Change in appetite 2  Feeling bad or failure about yourself  3  Trouble concentrating 1  Moving slowly or fidgety/restless 0  PHQ-9 Score 16  Difficult doing work/chores Somewhat difficult   PMHx, SurgHx, SocialHx, Medications, and Allergies were reviewed in the Visit Navigator and updated as appropriate.   Past Medical History:  Diagnosis Date  . Anxiety   . 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 Vaginal discharge 06/16/2017   Past Surgical History:  Procedure Laterality Date  . ABDOMINAL HYSTERECTOMY    . CESAREAN SECTION    . CHOLECYSTECTOMY    . WRIST SURGERY Right    Family History  Problem Relation Age of Onset  . Diabetes Mother   . Cancer Mother   . Melanoma Father   . Hypertension Father   . Hyperlipidemia Father   . Hypertension Sister   . Depression Sister   . Hypertension Sister   . Heart disease Sister   . Depression Sister    Social History  Substance Use Topics  . Smoking status: Never Smoker  . Smokeless tobacco: Never Used  . Alcohol use Yes     Comment: 2-3 a week   Current Medications and Allergies:   .  None  No Known Allergies   Review of Systems:   Pertinent items are noted in the HPI. Otherwise, ROS is negative.  Vitals:   Vitals:   06/11/17 1512  BP: (!) 144/80  Pulse: 91  Temp: 98.3 F (36.8 C)  TempSrc: Oral  SpO2: 97%  Weight: 193 lb 3.2 oz (87.6 kg)  Height: 5\' 3"  (1.6 m)     Body mass index is 34.22 kg/m.  Physical Exam:   Physical Exam  Constitutional: She appears well-developed and well-nourished. No distress.  HENT:  Head: Normocephalic and atraumatic.  Eyes: Pupils are equal, round, and reactive to light. Conjunctivae and EOM are normal.  Neck: Normal range of motion. Neck supple.  Cardiovascular: Normal rate, regular rhythm, normal heart sounds and intact distal pulses.   Pulmonary/Chest: Effort normal and breath sounds normal.  Abdominal: Soft. Bowel sounds are normal. She exhibits no  distension and no mass. There is no tenderness. There is no guarding. No hernia.  Genitourinary: Pelvic exam was performed with patient prone. Cervix exhibits no motion tenderness, no discharge and no friability. Right adnexum displays no tenderness and no fullness. Left adnexum displays no tenderness and no fullness. Vaginal discharge found.  Skin: Skin is warm.  Psychiatric: She has a normal mood and affect. Her behavior is normal.  Nursing note and vitals reviewed.  Assessment and Plan:   Carrie Richards was seen today for establish care.  Diagnoses and all orders for this visit:  Reactive depression Comments: Medications started today. Patient denied any active thoughts of suicide. I discussed her case with our LCSW. Dr. Melburn Hake will see her on Friday. Orders: -     LORazepam (ATIVAN) 0.5 MG tablet; Take 1 tablet (0.5 mg total) by mouth 2 (two) times daily as needed for anxiety. -     sertraline (ZOLOFT) 25 MG tablet; Take 1 tablet (25 mg total) by mouth daily.  Obesity (BMI 30-39.9) Comments: The patient is asked to make an attempt to improve diet and exercise patterns to aid in medical management of this problem.   Vaginal discharge Comments: Low risk for sexual transmitted diseases. Exam consistent with physiologic discharge. Orders: -     Cytology - PAP  . Reviewed expectations re: course of current medical issues. . Discussed self-management of symptoms. . Outlined signs and symptoms indicating need for more acute intervention. . Patient verbalized understanding and all questions were answered. Marland Kitchen Health Maintenance issues including appropriate healthy diet, exercise, and smoking avoidance were discussed with patient. . See orders for this visit as documented in the electronic medical record. . Patient received an After Visit Summary.  CMA served as Education administrator during this visit. History, Physical, and Plan performed by medical provider. The above documentation has been reviewed and is  accurate and complete. Briscoe Deutscher, D.O.  Briscoe Deutscher, DO [place] [place] 06/16/2017  No future appointments.

## 2017-06-12 ENCOUNTER — Telehealth: Payer: Self-pay | Admitting: Family Medicine

## 2017-06-12 ENCOUNTER — Ambulatory Visit: Payer: BLUE CROSS/BLUE SHIELD | Admitting: Family Medicine

## 2017-06-12 ENCOUNTER — Other Ambulatory Visit: Payer: Self-pay

## 2017-06-12 MED ORDER — BUPROPION HCL 75 MG PO TABS: 75.0000 mg | ORAL_TABLET | Freq: Every day | ORAL | 0 refills | Status: DC

## 2017-06-12 NOTE — Telephone Encounter (Signed)
Patient has diarrhea and thinks it's because of either zoloft or ativan.  Please advise.  Thank you,  -LL

## 2017-06-12 NOTE — Telephone Encounter (Signed)
Error

## 2017-06-12 NOTE — Telephone Encounter (Signed)
Per Dr. Juleen China, stop Zoloft.  Start Wellbutrin 75 mg (150 mg tablet 1/2 tablet) daily.  Willing to start Topamax 25 mg BID, but recommend trial of Wellbutrin first.

## 2017-06-12 NOTE — Telephone Encounter (Signed)
Wellbutrin has been sent to pharmacy.  Patient was notified.

## 2017-06-13 LAB — CYTOLOGY - PAP
Adequacy: ABSENT
Bacterial vaginitis: NEGATIVE
Candida vaginitis: NEGATIVE
Chlamydia: NEGATIVE
Diagnosis: NEGATIVE
Neisseria Gonorrhea: NEGATIVE
Trichomonas: NEGATIVE

## 2017-06-14 ENCOUNTER — Ambulatory Visit (INDEPENDENT_AMBULATORY_CARE_PROVIDER_SITE_OTHER): Payer: BLUE CROSS/BLUE SHIELD | Admitting: Psychology

## 2017-06-14 DIAGNOSIS — F4323 Adjustment disorder with mixed anxiety and depressed mood: Secondary | ICD-10-CM

## 2017-06-16 ENCOUNTER — Encounter: Payer: Self-pay | Admitting: Family Medicine

## 2017-06-16 DIAGNOSIS — K219 Gastro-esophageal reflux disease without esophagitis: Secondary | ICD-10-CM | POA: Insufficient documentation

## 2017-06-16 DIAGNOSIS — N898 Other specified noninflammatory disorders of vagina: Secondary | ICD-10-CM | POA: Insufficient documentation

## 2017-06-16 DIAGNOSIS — F329 Major depressive disorder, single episode, unspecified: Secondary | ICD-10-CM

## 2017-06-16 HISTORY — DX: Major depressive disorder, single episode, unspecified: F32.9

## 2017-06-17 ENCOUNTER — Other Ambulatory Visit: Payer: Self-pay | Admitting: Family Medicine

## 2017-06-17 ENCOUNTER — Telehealth: Payer: Self-pay | Admitting: *Deleted

## 2017-06-17 DIAGNOSIS — N898 Other specified noninflammatory disorders of vagina: Secondary | ICD-10-CM

## 2017-06-17 NOTE — Telephone Encounter (Signed)
-----   Message from Briscoe Deutscher, DO sent at 06/16/2017  2:06 PM EDT ----- This test was ordered incorrectly. The patient was seen for vaginal discharge. We used a Pap swab to get the sample. The intention was for vaginal discharge tests to be done using the swab. Originally, I was going to also get a Pap - until I learned that the patient had a previous hysterectomy. We ended up getting a Pap but no vaginal discharge labs (gonorrhea, Chlamydia, Trichomonas, BV, yeast). We should not charge the patient for this. We should also let her know that this happened as we do not have a true workup for her vaginal discharge because of this.

## 2017-06-17 NOTE — Telephone Encounter (Signed)
Cytology orders -- Gonorrhea, Chlamydia, Trichomonas, BV, and yeast have all been ordered.

## 2017-06-19 ENCOUNTER — Other Ambulatory Visit: Payer: Self-pay

## 2017-06-19 DIAGNOSIS — N898 Other specified noninflammatory disorders of vagina: Secondary | ICD-10-CM

## 2017-06-24 ENCOUNTER — Other Ambulatory Visit: Payer: Self-pay

## 2017-06-24 DIAGNOSIS — N898 Other specified noninflammatory disorders of vagina: Secondary | ICD-10-CM

## 2017-07-08 ENCOUNTER — Other Ambulatory Visit: Payer: Self-pay | Admitting: Family Medicine

## 2017-07-08 DIAGNOSIS — F329 Major depressive disorder, single episode, unspecified: Secondary | ICD-10-CM

## 2017-07-09 ENCOUNTER — Other Ambulatory Visit: Payer: Self-pay | Admitting: Family Medicine

## 2017-07-17 ENCOUNTER — Ambulatory Visit
Admission: RE | Admit: 2017-07-17 | Discharge: 2017-07-17 | Disposition: A | Discharge status: Home / Self Care | Payer: BLUE CROSS/BLUE SHIELD | Source: Ambulatory Visit | Attending: Family Medicine | Admitting: Family Medicine

## 2017-07-17 ENCOUNTER — Telehealth: Payer: Self-pay | Admitting: Family Medicine

## 2017-07-17 DIAGNOSIS — N898 Other specified noninflammatory disorders of vagina: Secondary | ICD-10-CM | POA: Diagnosis not present

## 2017-07-17 DIAGNOSIS — B029 Zoster without complications: Secondary | ICD-10-CM | POA: Diagnosis not present

## 2017-07-17 NOTE — Telephone Encounter (Signed)
Acute visit scheduled 9am tomorrow 08/22 w/ Juleen China for shingles on face.  -LL

## 2017-07-17 NOTE — Telephone Encounter (Signed)
Noted  

## 2017-07-18 ENCOUNTER — Ambulatory Visit: Payer: BLUE CROSS/BLUE SHIELD | Admitting: Family Medicine

## 2017-08-07 ENCOUNTER — Other Ambulatory Visit: Payer: Self-pay | Admitting: Family Medicine

## 2017-08-08 DIAGNOSIS — R300 Dysuria: Secondary | ICD-10-CM | POA: Diagnosis not present

## 2017-09-24 ENCOUNTER — Encounter: Payer: Self-pay | Admitting: Family Medicine

## 2017-09-24 ENCOUNTER — Ambulatory Visit (INDEPENDENT_AMBULATORY_CARE_PROVIDER_SITE_OTHER): Payer: BLUE CROSS/BLUE SHIELD | Admitting: Family Medicine

## 2017-09-24 VITALS — BP 136/78 | HR 91 | Temp 98.5°F | Ht 63.0 in | Wt 198.8 lb

## 2017-09-24 DIAGNOSIS — F329 Major depressive disorder, single episode, unspecified: Secondary | ICD-10-CM | POA: Diagnosis not present

## 2017-09-24 DIAGNOSIS — R1030 Lower abdominal pain, unspecified: Secondary | ICD-10-CM | POA: Diagnosis not present

## 2017-09-24 DIAGNOSIS — F5102 Adjustment insomnia: Secondary | ICD-10-CM | POA: Insufficient documentation

## 2017-09-24 DIAGNOSIS — K219 Gastro-esophageal reflux disease without esophagitis: Secondary | ICD-10-CM

## 2017-09-24 MED ORDER — OMEPRAZOLE 40 MG PO CPDR: 40.0000 mg | DELAYED_RELEASE_CAPSULE | Freq: Every day | ORAL | 3 refills | Status: DC

## 2017-09-24 MED ORDER — LORAZEPAM 1 MG PO TABS: 1.0000 mg | ORAL_TABLET | Freq: Every day | ORAL | 1 refills | Status: DC

## 2017-09-24 MED ORDER — BUPROPION HCL ER (XL) 150 MG PO TB24: 150.0000 mg | ORAL_TABLET | Freq: Every day | ORAL | 3 refills | Status: DC

## 2017-09-24 NOTE — Progress Notes (Signed)
Carrie Richards is a 46 y.o. female is here for follow up.  History of Present Illness:   Water quality scientist, CMA, acting as scribe for Dr. Juleen China.  HPI:    1. Lower abdominal pain. Worsening.  Patient complains of abdominal pain. The pain is described as aching and cramping, and is 5/10 in intensity. The patient is experiencing lower abdomen pain without radiation. Onset was 2 years ago. Symptoms have been gradually worsening. Aggravating factors: unknown.  Alleviating factors: none. Associated symptoms: frequent UTIs. The patient denies anorexia, arthralagias, belching, chills, constipation, diarrhea, dysuria, fever, flatus, frequency, hematochezia, hematuria, melena, myalgias, nausea, sweats and vomiting.   2. Reactive depression.  Worsening.  Depression symptoms: depressed mood, insomnia, difficulty concentrating. Current psychosocial stressors include: home issues, work.   Treatment to date has included Individual therapy and Medication.  Symptoms have been rapidly worsening since onset of treatment.  Side effects from the treatment include: none.   Alcohol use: no.  Drug use: no.  Exercise: minimal.   Patient denies current suicidal and homicidal ideation.   3. Gastroesophageal reflux disease. Not taking medication. Reflux daily despite diet changes.   4. Adjustment insomnia. Unable to sleep without Ativan. Ativan does help her to sleep through the night and feel rested in the morning.     Health Maintenance Due  Topic Date Due  . HIV Screening  07/31/1986  . TETANUS/TDAP  07/31/1990   Depression screen Carrie Richards 2/9 06/11/2017 06/18/2015  Decreased Interest 3 0  Down, Depressed, Hopeless 3 0  PHQ - 2 Score 6 0  Altered sleeping 1 -  Tired, decreased energy 3 -  Change in appetite 2 -  Feeling bad or failure about yourself  3 -  Trouble concentrating 1 -  Moving slowly or fidgety/restless 0 -  PHQ-9 Score 16 -  Difficult doing work/chores Somewhat difficult -   PMHx, SurgHx,  SocialHx, FamHx, Medications, and Allergies were reviewed in the Visit Navigator and updated as appropriate.   Patient Active Problem List   Diagnosis Date Noted  . Vaginal discharge 06/16/2017  . 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 GERD (gastroesophageal reflux disease) 06/16/2017  . BMI 33.0-33.9,adult 11/20/2013   Social History  Substance Use Topics  . Smoking status: Never Smoker  . Smokeless tobacco: Never Used  . Alcohol use Yes     Comment: 2-3 a week   Current Medications and Allergies:   Current Outpatient Prescriptions:  .  buPROPion (WELLBUTRIN XL) 150 MG 24 hr tablet, Take 1 tablet (150 mg total) by mouth daily., Disp: 90 tablet, Rfl: 3 .  LORazepam (ATIVAN) 1 MG tablet, Take 1 tablet (1 mg total) by mouth at bedtime. 1/2 to 1 po q hs prn sleep, Disp: 30 tablet, Rfl: 1 .  omeprazole (PRILOSEC) 40 MG capsule, Take 1 capsule (40 mg total) by mouth daily., Disp: 30 capsule, Rfl: 3  No Known Allergies Review of Systems   Pertinent items are noted in the HPI. Otherwise, ROS is negative.  Vitals:   Vitals:   09/24/17 0724  BP: 136/78  Pulse: 91  Temp: 98.5 F (36.9 C)  TempSrc: Oral  SpO2: 96%  Weight: 198 lb 12.8 oz (90.2 kg)  Height: 5\' 3"  (1.6 m)     Body mass index is 35.22 kg/m.   Physical Exam:   Physical Exam  Constitutional: She is oriented to person, place, and time. She appears well-developed and well-nourished.  HENT:  Head: Normocephalic and atraumatic.  Eyes: Pupils are equal, round,  and reactive to light. EOM are normal.  Neck: Normal range of motion. Neck supple.  Cardiovascular: Normal rate, regular rhythm, normal heart sounds and intact distal pulses.   Pulmonary/Chest: Effort normal and breath sounds normal.  Abdominal: Soft. Bowel sounds are normal. She exhibits no distension and no mass. There is tenderness. There is no rebound and no guarding. No hernia.  Neurological: She is alert and oriented to person, place, and time.  Skin: Skin  is warm.  Psychiatric: She has a normal mood and affect. Her behavior is normal. Judgment and thought content normal.  Nursing note and vitals reviewed.   Results for orders placed or performed in visit on 06/11/17  Cytology - PAP  Result Value Ref Range   Adequacy      Satisfactory for evaluation  endocervical/transformation zone component ABSENT.   Diagnosis      NEGATIVE FOR INTRAEPITHELIAL LESIONS OR MALIGNANCY.   Bacterial vaginitis Negative for Bacterial Vaginitis Microorganisms    Candida vaginitis Negative for Candida species    Chlamydia Negative    Neisseria gonorrhea Negative    Trichomonas Negative    Material Submitted CervicoVaginal Pap [ThinPrep Imaged]    US IMPRESSION: Previous hysterectomy. The vaginal cuff was not visualized on today's study.  Normal appearance of the right ovary. The left ovary was not visualized. No adnexal masses were observed.  Assessment and Plan:   Carrie Richards was seen today for follow-up.  Diagnoses and all orders for this visit:  Lower abdominal pain Comments: With Hx of multiple cesareans. She has never had a CT or colonoscopy. Will start with CT. Will send to GYN after.  Orders: -     CT Abdomen Pelvis W Contrast; Future  Reactive depression Comments: Increase Wellbutrin. Orders: -     buPROPion (WELLBUTRIN XL) 150 MG 24 hr tablet; Take 1 tablet (150 mg total) by mouth daily.  Gastroesophageal reflux disease without esophagitis Comments: Start PPI. Will possibly need EGD in the future. Orders: -     omeprazole (PRILOSEC) 40 MG capsule; Take 1 capsule (40 mg total) by mouth daily.  Adjustment insomnia Comments: Ativan q night while going through adjustment.  Orders: -     LORazepam (ATIVAN) 1 MG tablet; Take 1 tablet (1 mg total) by mouth at bedtime. 1/2 to 1 po q hs prn sleep    . Reviewed expectations re: course of current medical issues. . Discussed self-management of symptoms. . Outlined signs and symptoms  indicating need for more acute intervention. . Patient verbalized understanding and all questions were answered. Marland Kitchen Health Maintenance issues including appropriate healthy diet, exercise, and smoking avoidance were discussed with patient. . See orders for this visit as documented in the electronic medical record. . Patient received an After Visit Summary.  Briscoe Deutscher, DO [place], [place] 09/24/2017

## 2017-09-27 ENCOUNTER — Ambulatory Visit (INDEPENDENT_AMBULATORY_CARE_PROVIDER_SITE_OTHER)
Admission: RE | Admit: 2017-09-27 | Discharge: 2017-09-27 | Disposition: A | Discharge status: Home / Self Care | Payer: BLUE CROSS/BLUE SHIELD | Source: Ambulatory Visit | Attending: Family Medicine | Admitting: Family Medicine

## 2017-09-27 DIAGNOSIS — R1030 Lower abdominal pain, unspecified: Secondary | ICD-10-CM

## 2017-09-27 DIAGNOSIS — R109 Unspecified abdominal pain: Secondary | ICD-10-CM | POA: Diagnosis not present

## 2017-09-27 MED ORDER — IOPAMIDOL (ISOVUE-300) INJECTION 61%
100.0000 mL | Freq: Once | INTRAVENOUS | Status: AC | PRN
  Administered 2017-09-27: 100 mL via INTRAVENOUS

## 2017-09-28 ENCOUNTER — Other Ambulatory Visit: Payer: Self-pay | Admitting: Surgical

## 2017-09-28 DIAGNOSIS — R109 Unspecified abdominal pain: Secondary | ICD-10-CM

## 2017-10-03 ENCOUNTER — Other Ambulatory Visit: Payer: Self-pay

## 2017-10-03 ENCOUNTER — Ambulatory Visit: Payer: BLUE CROSS/BLUE SHIELD | Admitting: Obstetrics and Gynecology

## 2017-10-03 ENCOUNTER — Encounter: Payer: Self-pay | Admitting: Obstetrics and Gynecology

## 2017-10-03 VITALS — BP 130/80 | HR 84 | Resp 16 | Ht 62.25 in | Wt 197.0 lb

## 2017-10-03 DIAGNOSIS — R102 Pelvic and perineal pain: Secondary | ICD-10-CM | POA: Diagnosis not present

## 2017-10-03 DIAGNOSIS — R109 Unspecified abdominal pain: Secondary | ICD-10-CM

## 2017-10-03 NOTE — Progress Notes (Addendum)
46 y.o. [id number] MarriedCaucasianF referred by Dr Juleen China for a consultation for abdominal pain. The patient c/o a 14 year history of chronic BLQ abdominal pain. Started within a year of her last child being born via c/s (4 c/s's). She describes a constant baseline dull ache, 3/10 in severity and intermittent sharp pain. The sharp pains occur about every 3 days, lasts for a couple of hours and can be anywhere in the lower abdomen. The sharp pain can get to a 10/10. She takes ibuprofen, takes the edge off of the severe pain. The pain seem to be slowly getting worse. No fevers, nausea, emesis, diarrhea, or constipation. No urinary c/o. BM every couple of days, no correlation of the pain with BM. Hasn't noticed any change of the pain with eating. The pain is not cyclic. She does c/o bloating. She has a h/o 4 cesarean sections. Last one was 15 years ago. She had a TAH 13 years ago for AUB, she still has her tubes and ovaries. She wasn't told that she had significant adhesions after her surgery. She then had a Laparoscopic Cholecystectomy in 2007.  In 8/18 she had a pelvic ultrasound which showed a normal right ovary, her left ovary was not seen.  11/18 CT Abd/pelvis: normal, no adnexal abnormalities noted.  She is sexually active without pain.  She c/o an increase in white vaginal d/c, no odor, no irritation, no itching    No LMP recorded. Patient has had a hysterectomy.          Sexually active: Yes.    The current method of family planning is status post hysterectomy.    Exercising: Yes.    gym/treadmill/weights Smoker:  no  Health Maintenance: Pap:  04/2017 WNL per patient  History of abnormal Pap:  no MMG:  06-19-16 WNL  Colonoscopy:  Never BMD:   Never TDaP:  unsure Gardasil: N/A   reports that  has never smoked. she has never used smokeless tobacco. She reports that she drinks about 1.2 oz of alcohol per week. She reports that she does not use drugs.  Past Medical History:  Diagnosis Date   . Anemia   . Anxiety   . 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 Vaginal discharge 06/16/2017    Past Surgical History:  Procedure Laterality Date  . ABDOMINAL HYSTERECTOMY    . CESAREAN SECTION    . CHOLECYSTECTOMY    . WRIST SURGERY Right     Current Outpatient Medications  Medication Sig Dispense Refill  . buPROPion (WELLBUTRIN XL) 150 MG 24 hr tablet Take 1 tablet (150 mg total) by mouth daily. 90 tablet 3  . LORazepam (ATIVAN) 1 MG tablet Take 1 tablet (1 mg total) by mouth at bedtime. 1/2 to 1 po q hs prn sleep 30 tablet 1  . omeprazole (PRILOSEC) 40 MG capsule Take 1 capsule (40 mg total) by mouth daily. 30 capsule 3   No current facility-administered medications for this visit.     Family History  Problem Relation Age of Onset  . Diabetes Mother   . Cancer Mother   . Breast cancer Mother   . Ovarian cancer Mother   . Melanoma Father   . Hypertension Father   . Hyperlipidemia Father   . Hypertension Sister   . Depression Sister   . Hypertension Sister   . Heart disease Sister   . Depression Sister   . Heart attack Maternal Grandmother   . Breast cancer Paternal Grandmother   Mom with breast and ovarian  cancer. Ovarian first and then breast (48 and 54). Still living. Never had chemo, still living.  Dads sister with breast cancer, had both of her breast removed, +BRCA  Review of Systems  Constitutional: Negative.   HENT: Negative.   Eyes: Negative.   Respiratory: Negative.   Cardiovascular: Negative.   Gastrointestinal: Positive for abdominal pain.  Endocrine: Negative.   Genitourinary: Positive for pelvic pain.  Musculoskeletal: Negative.   Skin: Negative.   Allergic/Immunologic: Negative.   Neurological: Negative.   Psychiatric/Behavioral: Negative.     Exam:   BP 130/80 (BP Location: Right Arm, Patient Position: Sitting, Cuff Size: Normal)   Pulse 84   Resp 16   Ht 5' 2.25" (1.581 m)   Wt 197 lb (89.4 kg)   BMI 35.74 kg/m   Weight change:  _0 @ Height:   Height: 5' 2.25" (158.1 cm)  Ht Readings from Last 3 Encounters:  10/03/17 5' 2.25" (1.581 m)  09/24/17 5' 3" (1.6 m)  06/11/17 5' 3" (1.6 m)    General appearance: alert, cooperative and appears stated age Head: Normocephalic, without obvious abnormality, atraumatic Neck: no adenopathy, supple, symmetrical, trachea midline and thyroid normal to inspection and palpation Lungs: clear to auscultation bilaterally Cardiovascular: regular rate and rhythm Abdomen: soft, non-tender; non distended,  no masses,  no organomegaly Extremities: extremities normal, atraumatic, no cyanosis or edema Skin: Skin color, texture, turgor normal. No rashes or lesions Lymph nodes: Cervical, supraclavicular, and axillary nodes normal. No abnormal inguinal nodes palpated Neurologic: Grossly normal   Pelvic: External genitalia:  no lesions              Urethra:  normal appearing urethra with no masses, tenderness or lesions              Bartholins and Skenes: normal                 Vagina: normal appearing vagina with normal color. There is an increase in watery, white vaginal d/c              Cervix: absent               Bimanual Exam:  Uterus:  uterus absent              Adnexa: no mass, fullness, tenderness               Rectovaginal: Confirms               Anus:  normal sphincter tone, no lesions  Pelvic Floor: not tender  Bladder: not tender  Chaperone was present for exam.  A:  Chronic abdominal/pelvic pain, not cyclic, no bowel c/o other than distention. BM every 3 days  H/O hysterectomy, normal pelvic ultrasound and abdominal/pelvic CT  Family history of breast cancer, Mom with breast cancer, Paternal Aunt with breast cancer and BRCA +  ? H/o ovarian cancer in her Mother (given her description, I'm guessing it wasn't cancer)  Increased vaginal discharge  P:   I would recommend she f/u with GI, I don't think the pain is GYN  She could have adhesions, but her pain  started prior to her hysterectomy and didn't improve after it  She is also having an increase frequency of her pain, any adhesions she might have would have been there for years  I will try to get a copy of her op note from her hysterectomy  I did recommend a genetics referral, she declined  Affirm sent  If her  GI evaluation is negative, a diagnostic laparoscopy is an option, but there is risk of injury and reformation of any adhesions taken down. I'm not convinced it will fix her pain.    CC: Dr Juleen China Note sent  Addendum: op note reviewed.  On 11/02/03 The patient had a TAH. The only adhesions were between the bladder and the LUS (c/w prior c/s's). The indications for surgery were DUB, menometrorrhagia and severe dysmenorrhea. Pathology was benign.

## 2017-10-04 ENCOUNTER — Encounter: Payer: Self-pay | Admitting: Gastroenterology

## 2017-10-04 ENCOUNTER — Telehealth: Payer: Self-pay | Admitting: Family Medicine

## 2017-10-04 LAB — VAGINITIS/VAGINOSIS, DNA PROBE
Candida Species: NEGATIVE
GARDNERELLA VAGINALIS: NEGATIVE
Trichomonas vaginosis: NEGATIVE

## 2017-10-04 NOTE — Telephone Encounter (Signed)
Patient called in reference to scheduling GI appointment. Patient also wanted to talk to Dr. Juleen China about weight loss treatment. Please call patient and advise. OK to leave message.

## 2017-10-04 NOTE — Progress Notes (Addendum)
Thank you so much for your help with her.  I actually forgot that I told her about you at our visit because we discussed so many reasonable causes for her pain. I agree with the GI consult and will get that moving.  You're wonderful!  Danae Chen

## 2017-10-04 NOTE — Progress Notes (Deleted)
Sharee Pimple,  Sorry! Just reread your note and see that you DON'T think that this is GYN related.   I still think that you are wonderful. Danae Chen

## 2017-10-04 NOTE — Addendum Note (Signed)
Addended by: Briscoe Deutscher R on: 10/04/2017 11:00 AM   Modules accepted: Orders

## 2017-10-07 NOTE — Telephone Encounter (Signed)
Scheduled patient to come in to discuss medication.

## 2017-10-08 ENCOUNTER — Encounter: Payer: Self-pay | Admitting: Family Medicine

## 2017-10-08 ENCOUNTER — Ambulatory Visit: Payer: BLUE CROSS/BLUE SHIELD | Admitting: Family Medicine

## 2017-10-08 VITALS — BP 128/82 | HR 94 | Temp 98.2°F | Ht 62.25 in | Wt 197.8 lb

## 2017-10-08 DIAGNOSIS — F329 Major depressive disorder, single episode, unspecified: Secondary | ICD-10-CM

## 2017-10-08 DIAGNOSIS — F5102 Adjustment insomnia: Secondary | ICD-10-CM

## 2017-10-08 DIAGNOSIS — E669 Obesity, unspecified: Secondary | ICD-10-CM

## 2017-10-08 MED ORDER — TOPIRAMATE ER 50 MG PO CAP24: ORAL_CAPSULE | ORAL | 0 refills | Status: DC

## 2017-10-08 MED ORDER — PHENTERMINE HCL 37.5 MG PO TABS: 37.5000 mg | ORAL_TABLET | Freq: Every day | ORAL | 0 refills | Status: DC

## 2017-10-09 ENCOUNTER — Encounter: Payer: Self-pay | Admitting: Family Medicine

## 2017-10-09 NOTE — Progress Notes (Signed)
Carrie Richards is a 46 y.o. female is here for follow up.  History of Present Illness:   HPI:   1. Obesity (BMI 30-39.9). Not exercising or eating well. Depression and insomnia improving - see below. Previously lost > 60 pounds using Qsymia. Interested in starting weight loss journey again. Review of Systems  Constitutional: Positive for malaise/fatigue.  Cardiovascular: Negative for chest pain, palpitations and leg swelling.  Gastrointestinal: Negative for constipation and diarrhea.  Neurological: Negative for dizziness.  Psychiatric/Behavioral: Positive for depression. Negative for substance abuse and suicidal ideas.    Wt Readings from Last 3 Encounters:  10/08/17 197 lb 12.8 oz (89.7 kg)  10/03/17 197 lb (89.4 kg)  09/24/17 198 lb 12.8 oz (90.2 kg)    2. Adjustment insomnia. Taking Ativan prn. Sleeping 6-9 hours per night. Feels more well rested during the day.   3. Reactive depression. Improving. Tolerating Wellbutrin. Seeing therapist.     Health Maintenance Due  Topic Date Due  . HIV Screening  07/31/1986  . TETANUS/TDAP  07/31/1990   Depression screen [facility] 2/9 06/11/2017 06/18/2015  Decreased Interest 3 0  Down, Depressed, Hopeless 3 0  PHQ - 2 Score 6 0  Altered sleeping 1 -  Tired, decreased energy 3 -  Change in appetite 2 -  Feeling bad or failure about yourself  3 -  Trouble concentrating 1 -  Moving slowly or fidgety/restless 0 -  PHQ-9 Score 16 -  Difficult doing work/chores Somewhat difficult -   PMHx, SurgHx, SocialHx, FamHx, Medications, and Allergies were reviewed in the Visit Navigator and updated as appropriate.   Patient Active Problem List   Diagnosis Date Noted  . 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 Vaginal discharge 06/16/2017  . 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 GERD (gastroesophageal reflux disease) 06/16/2017  . BMI 33.0-33.9,adult 11/20/2013   Social History   Tobacco Use  . Smoking status: Never Smoker  . Smokeless tobacco: Never Used    Substance Use Topics  . Alcohol use: Yes    Alcohol/week: 1.2 oz    Types: 2 Standard drinks or equivalent per week  . Drug use: No   Current Medications and Allergies:   .  buPROPion (WELLBUTRIN XL) 150 MG 24 hr tablet, Take 1 tablet (150 mg total) by mouth daily., Disp: 90 tablet, Rfl: 3 .  LORazepam (ATIVAN) 1 MG tablet, Take 1 tablet (1 mg total) by mouth at bedtime. 1/2 to 1 po q hs prn sleep, Disp: 30 tablet, Rfl: 1 .  omeprazole (PRILOSEC) 40 MG capsule, Take 1 capsule (40 mg total) by mouth daily., Disp: 30 capsule, Rfl: 3  No Known Allergies   Review of Systems   Pertinent items are noted in the HPI. Otherwise, ROS is negative.  Vitals:   Vitals:   10/08/17 0950  BP: 128/82  Pulse: 94  Temp: 98.2 F (36.8 C)  TempSrc: Oral  SpO2: 96%  Weight: 197 lb 12.8 oz (89.7 kg)  Height: 5' 2.25" (1.581 m)     Body mass index is 35.89 kg/m.   Physical Exam:   Physical Exam  Constitutional: She appears well-nourished.  HENT:  Head: Normocephalic and atraumatic.  Eyes: EOM are normal. Pupils are equal, round, and reactive to light.  Neck: Normal range of motion. Neck supple.  Cardiovascular: Normal rate, regular rhythm, normal heart sounds and intact distal pulses.  Pulmonary/Chest: Effort normal.  Abdominal: Soft.  Skin: Skin is warm.  Psychiatric: She has a normal mood and affect. Her behavior  is normal.  Nursing note and vitals reviewed.   Assessment and Plan:   Leta was seen today for weight gain.  Diagnoses and all orders for this visit:  Obesity (BMI 30-39.9) Comments: Contraindications to weight loss: none. Patient readiness to commit to diet and activity changes: good. Barriers to weight loss: stress (family and work stress).  Plan: 1. Diagnostic studies to rule out secondary causes of obesity: completed. 2. General patient education  Importance of long-term maintenance treatment in weight loss.  Use non-food self-rewards to reinforce behavior  changes.  Elicit support from others; identify saboteurs.  Practical target weight is usually around 2 BMI units below current weight. 3. Diet interventions:   Risks of dieting were reviewed, including fatigue, temporary hair loss, gallstone formation, gout, and with very low calorie diets, electrolyte abnormalities, nutrient inadequacies, and loss of lean body mass.  Handouts provided.  4. Exercise intervention:   Informal measures, e.g. taking stairs instead of elevator.  Formal exercise regimen: no. 5. Other behavioral treatment: stress management. 6. Other treatment: Medication: see below. 7. Patient to keep a weight log that we will review at follow up. 8. Follow up: 1 month and as needed. Orders: -     phentermine (ADIPEX-P) 37.5 MG tablet; Take 1 tablet (37.5 mg total) daily before breakfast by mouth. -     Topiramate ER (TROKENDI XR) 50 MG CP24; One po q hs x 2 weeks, then increase to twp po q hs  Adjustment insomnia Comments: Improving. Using Ativan prn.  Reactive depression Comments: Tolerating Wellbutrin. Improving.    . Reviewed expectations re: course of current medical issues. . Discussed self-management of symptoms. . Outlined signs and symptoms indicating need for more acute intervention. . Patient verbalized understanding and all questions were answered. Marland Kitchen Health Maintenance issues including appropriate healthy diet, exercise, and smoking avoidance were discussed with patient. . See orders for this visit as documented in the electronic medical record. . Patient received an After Visit Summary.  Briscoe Deutscher, DO [place], [place] 10/09/2017  Future Appointments  Date Time Provider [facility]  11/28/2017  8:45 AM Danis, Kirke Corin, MD [facility]

## 2017-10-10 ENCOUNTER — Telehealth: Payer: Self-pay | Admitting: Family Medicine

## 2017-10-10 NOTE — Telephone Encounter (Signed)
PA for Trokendi is in process.  Awaiting insurance decision.  Only received request from [employer] today.

## 2017-10-10 NOTE — Telephone Encounter (Signed)
Patient checking on status of PA for Copamex. Please advise.

## 2017-10-11 NOTE — Telephone Encounter (Signed)
Colletta Maryland, [place] representstive, would like a call back RE PA for Trokendie.

## 2017-10-11 NOTE — Telephone Encounter (Signed)
Patient checking on status of PA.

## 2017-10-14 NOTE — Telephone Encounter (Signed)
LM for Colletta Maryland at [facility] to return my call.

## 2017-10-15 ENCOUNTER — Telehealth: Payer: Self-pay | Admitting: Family Medicine

## 2017-10-15 NOTE — Telephone Encounter (Signed)
[employer] calling to gather more information for prior auth on medication Trokendi, as per prior note.

## 2017-10-16 NOTE — Telephone Encounter (Signed)
LM for patient to return call.

## 2017-10-16 NOTE — Telephone Encounter (Signed)
Patient will be notified of PA decision when we receive it.

## 2017-10-16 NOTE — Telephone Encounter (Signed)
Please see other message.

## 2017-10-16 NOTE — Telephone Encounter (Signed)
Spoke with [employer].  Answered additional questions.  Awaiting PA decision via fax.

## 2017-10-23 ENCOUNTER — Telehealth: Payer: Self-pay

## 2017-10-23 NOTE — Telephone Encounter (Signed)
Received denial from insurance for Trokendi XR. States it is only approved to treat seizure control or Migraine prevention

## 2017-10-24 NOTE — Telephone Encounter (Signed)
An appeal has been started 

## 2017-10-25 ENCOUNTER — Telehealth: Payer: Self-pay | Admitting: Family Medicine

## 2017-10-25 ENCOUNTER — Other Ambulatory Visit: Payer: Self-pay

## 2017-10-25 DIAGNOSIS — F5102 Adjustment insomnia: Secondary | ICD-10-CM

## 2017-10-25 MED ORDER — LORAZEPAM 1 MG PO TABS: 1.0000 mg | ORAL_TABLET | Freq: Every day | ORAL | 1 refills | Status: DC

## 2017-10-25 NOTE — Telephone Encounter (Signed)
Okay Ativan refill.

## 2017-10-25 NOTE — Telephone Encounter (Signed)
CRM for notification. See Telephone encounter for:  10/25/17.  Pt states pharmacy called 2 weeks ago to get a pre authorization for topirmate and they have not heard back. Also pt states she needs a refill on her ativan.

## 2017-10-25 NOTE — Telephone Encounter (Signed)
Refill faxed to patient's pharmacy.

## 2017-10-25 NOTE — Telephone Encounter (Signed)
Trokendi PA is under appeal.  Please advise on Ativan refill.

## 2017-10-25 NOTE — Telephone Encounter (Signed)
Copied from [place] [id number]. Topic: Quick Communication - See Telephone Encounter >> Oct 25, 2017 10:28 AM Conception Chancy, NT wrote: CRM for notification. See Telephone encounter for:  10/25/17.  Pt states pharmacy called 2 weeks ago to get a pre authorization for topirmate and they have not heard back. Also pt states she needs a refill on her ativan.

## 2017-11-01 NOTE — Telephone Encounter (Signed)
Appeal has been denied.  Trokendi will only be approved for migraines or seizures.

## 2017-11-04 MED ORDER — TOPIRAMATE 50 MG PO TABS: 50.0000 mg | ORAL_TABLET | Freq: Two times a day (BID) | ORAL | 3 refills | Status: DC

## 2017-11-04 NOTE — Addendum Note (Signed)
Addended by: Briscoe Deutscher R on: 11/04/2017 02:34 PM   Modules accepted: Orders

## 2017-11-04 NOTE — Telephone Encounter (Signed)
Topamax 50 BID sent to pharmacy. Please tell the patient that the Trokendi is not covered.

## 2017-11-06 NOTE — Telephone Encounter (Signed)
Patient informed will call if any problems.

## 2017-11-08 ENCOUNTER — Ambulatory Visit: Payer: BLUE CROSS/BLUE SHIELD | Admitting: Family Medicine

## 2017-11-08 ENCOUNTER — Other Ambulatory Visit: Payer: Self-pay | Admitting: Family Medicine

## 2017-11-08 ENCOUNTER — Encounter: Payer: Self-pay | Admitting: Family Medicine

## 2017-11-08 VITALS — BP 126/82 | HR 105 | Temp 98.1°F | Wt 182.6 lb

## 2017-11-08 DIAGNOSIS — F5102 Adjustment insomnia: Secondary | ICD-10-CM

## 2017-11-08 DIAGNOSIS — E669 Obesity, unspecified: Secondary | ICD-10-CM | POA: Diagnosis not present

## 2017-11-08 DIAGNOSIS — Z1231 Encounter for screening mammogram for malignant neoplasm of breast: Secondary | ICD-10-CM | POA: Diagnosis not present

## 2017-11-08 DIAGNOSIS — Z1239 Encounter for other screening for malignant neoplasm of breast: Secondary | ICD-10-CM

## 2017-11-08 MED ORDER — LORAZEPAM 1 MG PO TABS: 1.0000 mg | ORAL_TABLET | Freq: Every day | ORAL | 1 refills | Status: DC

## 2017-11-08 MED ORDER — PHENTERMINE HCL 37.5 MG PO TABS: 37.5000 mg | ORAL_TABLET | Freq: Every day | ORAL | 2 refills | Status: DC

## 2017-11-09 NOTE — Progress Notes (Signed)
Carrie Richards is a 46 y.o. female is here for follow up.  History of Present Illness:   HPI: See Assessment and Plan section for Problem Based Charting of issues discussed today.  Health Maintenance Due  Topic Date Due  . TETANUS/TDAP  07/31/1990   Depression screen [facility] 2/9 06/11/2017 06/18/2015  Decreased Interest 3 0  Down, Depressed, Hopeless 3 0  PHQ - 2 Score 6 0  Altered sleeping 1 -  Tired, decreased energy 3 -  Change in appetite 2 -  Feeling bad or failure about yourself  3 -  Trouble concentrating 1 -  Moving slowly or fidgety/restless 0 -  PHQ-9 Score 16 -  Difficult doing work/chores Somewhat difficult -   PMHx, SurgHx, SocialHx, FamHx, Medications, and Allergies were reviewed in the Visit Navigator and updated as appropriate.   Patient Active Problem List   Diagnosis Date Noted  . 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 Vaginal discharge 06/16/2017  . 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 GERD (gastroesophageal reflux disease) 06/16/2017  . BMI 33.0-33.9,adult 11/20/2013   Social History   Tobacco Use  . Smoking status: Never Smoker  . Smokeless tobacco: Never Used  Substance Use Topics  . Alcohol use: Yes    Alcohol/week: 1.2 oz    Types: 2 Standard drinks or equivalent per week  . Drug use: No   Current Medications and Allergies:   .  buPROPion (WELLBUTRIN XL) 150 MG 24 hr tablet, Take 1 tablet (150 mg total) by mouth daily., Disp: 90 tablet, Rfl: 3 .  LORazepam (ATIVAN) 1 MG tablet, Take 1 tablet (1 mg total) by mouth at bedtime. 1/2 to 1 po q hs prn sleep, Disp: 30 tablet, Rfl: 1 .  omeprazole (PRILOSEC) 40 MG capsule, Take 1 capsule (40 mg total) by mouth daily., Disp: 30 capsule, Rfl: 3 .  phentermine (ADIPEX-P) 37.5 MG tablet, Take 1 tablet (37.5 mg total) by mouth daily before breakfast., Disp: 30 tablet, Rfl: 2 .  topiramate (TOPAMAX) 50 MG tablet, Take 1 tablet (50 mg total) by mouth 2 (two) times daily., Disp: 60 tablet, Rfl: 3  No Known Allergies     Review of Systems   Pertinent items are noted in the HPI. Otherwise, ROS is negative.  Vitals:   Vitals:   11/08/17 1430  BP: 126/82  Pulse: (!) 105  Temp: 98.1 F (36.7 C)  TempSrc: Oral  SpO2: 98%  Weight: 182 lb 9.6 oz (82.8 kg)     Body mass index is 33.13 kg/m.   Physical Exam:   Physical Exam  Constitutional: She is oriented to person, place, and time. She appears well-developed and well-nourished. No distress.  HENT:  Head: Normocephalic and atraumatic.  Right Ear: External ear normal.  Left Ear: External ear normal.  Nose: Nose normal.  Mouth/Throat: Oropharynx is clear and moist.  Eyes: Conjunctivae and EOM are normal. Pupils are equal, round, and reactive to light.  Neck: Normal range of motion. Neck supple. No thyromegaly present.  Cardiovascular: Normal rate, regular rhythm, normal heart sounds and intact distal pulses.  Pulmonary/Chest: Effort normal and breath sounds normal.  Abdominal: Soft. Bowel sounds are normal.  Musculoskeletal: Normal range of motion.  Lymphadenopathy:    She has no cervical adenopathy.  Neurological: She is alert and oriented to person, place, and time.  Skin: Skin is warm and dry. Capillary refill takes less than 2 seconds.  Psychiatric: She has a normal mood and affect. Her behavior is normal.  Nursing note  and vitals reviewed.   Assessment and Plan:   [name] was seen today for follow-up.  Diagnoses and all orders for this visit:  Adjustment insomnia Comments: The patient has had major life stressors over the past several months.  She states that with the Ativan 1 mg each night, she is able to sleep.  Unfortunately, over the past 3 weeks, she has had a death in her family and also experienced losing a coworker due to an accident.  This has caused a setback that the patient acknowledges she can get through.  No suicidal or homicidal thoughts.  No alcohol or drug use.  Ativan q night while going through adjustment.   Orders: -     LORazepam (ATIVAN) 1 MG tablet; Take 1 tablet (1 mg total) by mouth at bedtime. 1/2 to 1 po q hs prn sleep -     LORazepam (ATIVAN) 1 MG tablet; Take 1 tablet (1 mg total) by mouth at bedtime. 1/2 to 1 po q hs prn sleep -     LORazepam (ATIVAN) 1 MG tablet; Take 1 tablet (1 mg total) by mouth at bedtime. 1/2 to 1 po q hs prn sleep  Obesity (BMI 30-39.9) Comments: Patient has been doing a fairly good job of making healthy food choices.  She was exercising regularly until the weather changed.  She is also recently had a setback with family stress.  She is tolerating the phentermine very well so we will continue it for the next 3 months.  We did review the importance of regular exercise for weight loss and, more importantly, stress reduction. Orders: -     phentermine (ADIPEX-P) 37.5 MG tablet; Take 1 tablet (37.5 mg total) by mouth daily before breakfast.  Screening for breast cancer -     MM Digital Screening; Future  Records requested if needed. Time spent with the patient: 30 minutes, of which >50% was spent in obtaining information about her symptoms, reviewing her previous labs, evaluations, and treatments, counseling her about her condition (please see the discussed topics above), and developing a plan to further investigate it; she had a number of questions which I addressed.   . Reviewed expectations re: course of current medical issues. . Discussed self-management of symptoms. . Outlined signs and symptoms indicating need for more acute intervention. . Patient verbalized understanding and all questions were answered. Carrie Richards Health Maintenance issues including appropriate healthy diet, exercise, and smoking avoidance were discussed with patient. . See orders for this visit as documented in the electronic medical record. . Patient received an After Visit Summary.  Briscoe Deutscher, DO [place], [facility] 11/09/2017  Future Appointments  Date Time Provider [place]  11/28/2017  8:45 AM Doran Stabler, MD [facility]  12/09/2017  5:00 PM [facility] MM 3 [facility]  02/06/2018  7:20 AM Briscoe Deutscher, DO [facility]

## 2017-11-28 ENCOUNTER — Ambulatory Visit: Payer: Self-pay | Admitting: Gastroenterology

## 2017-12-09 ENCOUNTER — Ambulatory Visit: Payer: Self-pay

## 2017-12-20 DIAGNOSIS — R05 Cough: Secondary | ICD-10-CM | POA: Diagnosis not present

## 2017-12-20 DIAGNOSIS — R0602 Shortness of breath: Secondary | ICD-10-CM | POA: Diagnosis not present

## 2017-12-31 ENCOUNTER — Encounter: Payer: Self-pay | Admitting: Family Medicine

## 2018-02-03 ENCOUNTER — Ambulatory Visit: Payer: BLUE CROSS/BLUE SHIELD | Admitting: Family Medicine

## 2018-02-03 ENCOUNTER — Encounter: Payer: Self-pay | Admitting: Family Medicine

## 2018-02-03 VITALS — BP 130/70 | HR 82 | Temp 97.6°F | Ht 62.0 in | Wt 167.0 lb

## 2018-02-03 DIAGNOSIS — R300 Dysuria: Secondary | ICD-10-CM | POA: Diagnosis not present

## 2018-02-03 DIAGNOSIS — N390 Urinary tract infection, site not specified: Secondary | ICD-10-CM

## 2018-02-03 LAB — URINALYSIS, MICROSCOPIC ONLY

## 2018-02-03 LAB — POCT URINALYSIS DIPSTICK
Blood, UA: NEGATIVE
Glucose, UA: NEGATIVE
Nitrite, UA: POSITIVE
Spec Grav, UA: >=1.03 — AB (ref 1.010–1.025)
Urobilinogen, UA: 4 E.U./dL — AB
pH, UA: 5.5 (ref 5.0–8.0)

## 2018-02-03 MED ORDER — NITROFURANTOIN MONOHYD MACRO 100 MG PO CAPS: 100.0000 mg | ORAL_CAPSULE | Freq: Every day | ORAL | 3 refills | Status: DC

## 2018-02-03 MED ORDER — CIPROFLOXACIN HCL 250 MG PO TABS: 250.0000 mg | ORAL_TABLET | Freq: Two times a day (BID) | ORAL | 0 refills | Status: DC

## 2018-02-03 NOTE — Progress Notes (Signed)
   Carrie Richards is a 47 y.o. female here for an acute visit.  History of Present Illness:   Urinary Tract Infection   This is a recurrent problem. The current episode started in the past 7 days. The problem occurs every urination. The pain is moderate. There has been no fever. She is sexually active. There is no history of pyelonephritis. Pertinent negatives include no chills, discharge, flank pain, nausea, possible pregnancy or vomiting. She has tried antibiotics for the symptoms. The treatment provided no relief. Her past medical history is significant for recurrent UTIs.   PMHx, SurgHx, SocialHx, Medications, and Allergies were reviewed in the Visit Navigator and updated as appropriate.  Current Medications:   .  buPROPion (WELLBUTRIN XL) 150 MG 24 hr tablet, Take 1 tablet (150 mg total) by mouth daily., Disp: 90 tablet, Rfl: 3 .  LORazepam (ATIVAN) 1 MG tablet, Take 1 tablet (1 mg total) by mouth at bedtime. 1/2 to 1 po q hs prn sleep, Disp: 30 tablet, Rfl: 1 .  omeprazole (PRILOSEC) 40 MG capsule, Take 1 capsule (40 mg total) by mouth daily., Disp: 30 capsule, Rfl: 3 .  phentermine (ADIPEX-P) 37.5 MG tablet, Take 1 tablet (37.5 mg total) by mouth daily before breakfast., Disp: 30 tablet, Rfl: 2 .  topiramate (TOPAMAX) 50 MG tablet, Take 1 tablet (50 mg total) by mouth 2 (two) times daily., Disp: 60 tablet, Rfl: 3   No Known Allergies   Review of Systems:   Pertinent items are noted in the HPI. Otherwise, ROS is negative.  Vitals:   Vitals:   02/03/18 1427  BP: 130/70  Pulse: 82  Temp: 97.6 F (36.4 C)  SpO2: 96%  Weight: 167 lb (75.8 kg)  Height: 5\' 2"  (1.575 m)     Body mass index is 30.54 kg/m.  Physical Exam:   Physical Exam  Constitutional: She appears well-developed and well-nourished. No distress.  HENT:  Head: Normocephalic and atraumatic.  Eyes: EOM are normal. Pupils are equal, round, and reactive to light.  Neck: Normal range of motion. Neck supple.    Cardiovascular: Normal rate, regular rhythm, normal heart sounds and intact distal pulses.  Pulmonary/Chest: Effort normal.  Abdominal: Soft.  Skin: Skin is warm.  Psychiatric: She has a normal mood and affect. Her behavior is normal.  Nursing note and vitals reviewed.   Assessment and Plan:   Carrie Richards was seen today for urinary tract infection.  Diagnoses and all orders for this visit:  Recurrent UTI -     POCT urinalysis dipstick -     Urine Microscopic -     Urine Culture -     ciprofloxacin (CIPRO) 250 MG tablet; Take 1 tablet (250 mg total) by mouth 2 (two) times daily.  Frequent UTI -     nitrofurantoin, macrocrystal-monohydrate, (MACROBID) 100 MG capsule; Take 1 capsule (100 mg total) by mouth daily.   . Reviewed expectations re: course of current medical issues. . Discussed self-management of symptoms. . Outlined signs and symptoms indicating need for more acute intervention. . Patient verbalized understanding and all questions were answered. Marland Kitchen Health Maintenance issues including appropriate healthy diet, exercise, and smoking avoidance were discussed with patient. . See orders for this visit as documented in the electronic medical record. . Patient received an After Visit Summary.  Briscoe Deutscher, DO [place], [facility] 02/05/2018

## 2018-02-04 LAB — URINE CULTURE
MICRO NUMBER:: 90307117
Result:: NO GROWTH
SPECIMEN QUALITY:: ADEQUATE

## 2018-02-05 ENCOUNTER — Encounter: Payer: Self-pay | Admitting: Family Medicine

## 2018-02-06 ENCOUNTER — Ambulatory Visit: Payer: BLUE CROSS/BLUE SHIELD | Admitting: Family Medicine

## 2018-03-01 ENCOUNTER — Other Ambulatory Visit: Payer: Self-pay | Admitting: Family Medicine

## 2018-03-07 DIAGNOSIS — G4700 Insomnia, unspecified: Secondary | ICD-10-CM | POA: Diagnosis not present

## 2018-03-07 DIAGNOSIS — L739 Follicular disorder, unspecified: Secondary | ICD-10-CM | POA: Diagnosis not present

## 2018-03-07 DIAGNOSIS — N926 Irregular menstruation, unspecified: Secondary | ICD-10-CM | POA: Diagnosis not present

## 2018-03-18 ENCOUNTER — Other Ambulatory Visit: Payer: Self-pay | Admitting: Family Medicine

## 2018-03-18 DIAGNOSIS — E669 Obesity, unspecified: Secondary | ICD-10-CM

## 2018-03-26 ENCOUNTER — Other Ambulatory Visit: Payer: Self-pay | Admitting: Family Medicine

## 2018-03-26 DIAGNOSIS — E669 Obesity, unspecified: Secondary | ICD-10-CM

## 2018-03-26 NOTE — Telephone Encounter (Signed)
Copied from [place] [id number]. Topic: Quick Communication - Rx Refill/Question >> Mar 26, 2018  4:24 PM Oliver Pila B wrote: Medication: phentermine (ADIPEX-P) 37.5 MG tablet [[id number]]  Has the patient contacted their pharmacy? Yes.   (Agent: If no, request that the patient contact the pharmacy for the refill.) Preferred Pharmacy (with phone number or street name): [facility] Agent: Please be advised that RX refills may take up to 3 business days. We ask that you follow-up with your pharmacy.

## 2018-03-27 NOTE — Telephone Encounter (Signed)
Called patient to make follow up for refills. She refused appointment states she will just talk to you in July when she needs to come for refill of other medications. I offered appointment but she refused to make anything. States she was just seen not long ago for refill on other medications and she will not keep coming back and forth from work out of town  for this.

## 2018-04-22 ENCOUNTER — Ambulatory Visit: Payer: Self-pay

## 2018-04-22 NOTE — Telephone Encounter (Signed)
Patient called in with c/o "open incision." She says "the area where I had my c-section in 2003 is open and bleeding off and on, in the middle about 2 inches. I noticed it Sunday when I was hurting and burning. I asked my husband to look and he said it was open." I asked is it red, she says "yes and bleeds some, but no pus draining, no fever." I asked about the pain, she says "from 5-8." I asked about other symptoms, she says "I've noticed I've been drowsy and taking naps, which is unusual for me and I have been shaking some." According to protocol, see PCP within 3 days, appointment scheduled for Friday, 04/25/18 at 1320 with Dr. Juleen China, care advice given, patient verbalized understanding.   Reason for Disposition . Other signs of wound infection  Answer Assessment - Initial Assessment Questions 1. LOCATION: "Where is the wound located?"      Where the c-section incision is located in the middle 2. WOUND APPEARANCE: "What does the wound look like?"      Red, open 3. SIZE: If redness is present, ask: "What is the size of the red area?" (Inches, centimeters, or compare to size of a coin)      About 2 inches open 4. SPREAD: "What's changed in the last day?"  "Do you see any red streaks coming from the wound?"     No 5. ONSET: "When did it start to look infected?"      Sunday 6. MECHANISM: "How did the wound start, what was the cause?"     I don't know 7. PAIN: "Is there any pain?" If so, ask: "How bad is the pain?"   (Scale 1-10; or mild, moderate, severe)     Right now 5, can get up to 8 8. FEVER: "Do you have a fever?" If so, ask: "What is your temperature, how was it measured, and when did it start?"     No 9. OTHER SYMPTOMS: "Do you have any other symptoms?" (e.g., shaking chills, weakness, rash elsewhere on body)     Drowsy 10. PREGNANCY: "Is there any chance you are pregnant?" "When was your last menstrual period?"       No  Protocols used: WOUND INFECTION-A-AH

## 2018-04-25 ENCOUNTER — Ambulatory Visit: Payer: BLUE CROSS/BLUE SHIELD | Admitting: Family Medicine

## 2018-04-25 ENCOUNTER — Encounter: Payer: Self-pay | Admitting: Family Medicine

## 2018-04-25 VITALS — BP 134/84 | HR 89 | Temp 98.1°F | Ht 62.0 in | Wt 164.2 lb

## 2018-04-25 DIAGNOSIS — E669 Obesity, unspecified: Secondary | ICD-10-CM | POA: Diagnosis not present

## 2018-04-25 DIAGNOSIS — L989 Disorder of the skin and subcutaneous tissue, unspecified: Secondary | ICD-10-CM | POA: Diagnosis not present

## 2018-04-25 MED ORDER — PHENTERMINE HCL 37.5 MG PO TABS: 37.5000 mg | ORAL_TABLET | Freq: Every day | ORAL | 2 refills | Status: DC

## 2018-04-25 MED ORDER — MUPIROCIN 2 % EX OINT: 1.0000 "application " | TOPICAL_OINTMENT | Freq: Two times a day (BID) | CUTANEOUS | 0 refills | Status: DC

## 2018-04-25 NOTE — Progress Notes (Signed)
Carrie Richards is a 47 y.o. female is here for AN ACUTE VISIT.  History of Present Illness:   Shaune Pascal CMA acting as scribe for Dr. Juleen China.  HPI: Patient comes in today due to her C section opening up. This has happened twice. She noticed it was open on the 28th of this month. She has a nurse friend that put a bandage on steri strip on it. It has closed up now. She had her C Section 15 years ago.   Patient also wants to continue weight loss treatment with phentermine.  She has done very well with this medication.  She is off of it now has been off for about a month due to running out of the medication.  No side effects when on it.  Health Maintenance Due  Topic Date Due  . TETANUS/TDAP  07/31/1990   Depression screen [facility] 2/9 06/11/2017 06/18/2015  Decreased Interest 3 0  Down, Depressed, Hopeless 3 0  PHQ - 2 Score 6 0  Altered sleeping 1 -  Tired, decreased energy 3 -  Change in appetite 2 -  Feeling bad or failure about yourself  3 -  Trouble concentrating 1 -  Moving slowly or fidgety/restless 0 -  PHQ-9 Score 16 -  Difficult doing work/chores Somewhat difficult -   PMHx, SurgHx, SocialHx, FamHx, Medications, and Allergies were reviewed in the Visit Navigator and updated as appropriate.   Patient Active Problem List   Diagnosis Date Noted  . 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 Vaginal discharge 06/16/2017  . 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 GERD (gastroesophageal reflux disease) 06/16/2017  . BMI 33.0-33.9,adult 11/20/2013   Social History   Tobacco Use  . Smoking status: Never Smoker  . Smokeless tobacco: Never Used  Substance Use Topics  . Alcohol use: Yes    Alcohol/week: 1.2 oz    Types: 2 Standard drinks or equivalent per week  . Drug use: No   Current Medications and Allergies:   Current Outpatient Medications:  .  buPROPion (WELLBUTRIN XL) 150 MG 24 hr tablet, Take 1 tablet (150 mg total) by mouth daily., Disp: 90 tablet, Rfl: 3 .  ciprofloxacin  (CIPRO) 250 MG tablet, Take 1 tablet (250 mg total) by mouth 2 (two) times daily., Disp: 6 tablet, Rfl: 0 .  LORazepam (ATIVAN) 1 MG tablet, Take 1 tablet (1 mg total) by mouth at bedtime. 1/2 to 1 po q hs prn sleep, Disp: 30 tablet, Rfl: 1 .  nitrofurantoin, macrocrystal-monohydrate, (MACROBID) 100 MG capsule, Take 1 capsule (100 mg total) by mouth daily., Disp: 90 capsule, Rfl: 3 .  omeprazole (PRILOSEC) 40 MG capsule, Take 1 capsule (40 mg total) by mouth daily., Disp: 30 capsule, Rfl: 3 .  phentermine (ADIPEX-P) 37.5 MG tablet, Take 1 tablet (37.5 mg total) by mouth daily before breakfast., Disp: 30 tablet, Rfl: 2 .  topiramate (TOPAMAX) 50 MG tablet, TAKE 1 TABLET BY MOUTH TWICE A DAY, Disp: 60 tablet, Rfl: 3  No Known Allergies Review of Systems   Pertinent items are noted in the HPI. Otherwise, ROS is negative.  Vitals:   Vitals:   04/25/18 1320  BP: 134/84  Pulse: 89  Temp: 98.1 F (36.7 C)  TempSrc: Oral  SpO2: 97%  Weight: 164 lb 3.2 oz (74.5 kg)  Height: 5\' 2"  (1.575 m)     Body mass index is 30.03 kg/m.  Physical Exam:   Physical Exam  Constitutional: She is oriented to person, place, and time. She appears  well-developed and well-nourished. No distress.  HENT:  Head: Normocephalic and atraumatic.  Right Ear: External ear normal.  Left Ear: External ear normal.  Nose: Nose normal.  Mouth/Throat: Oropharynx is clear and moist.  Eyes: Pupils are equal, round, and reactive to light. Conjunctivae and EOM are normal.  Neck: Normal range of motion. Neck supple. No thyromegaly present.  Cardiovascular: Normal rate, regular rhythm, normal heart sounds and intact distal pulses.  Pulmonary/Chest: Effort normal and breath sounds normal.  Abdominal: Soft. Bowel sounds are normal.  Musculoskeletal: Normal range of motion.  Lymphadenopathy:    She has no cervical adenopathy.  Neurological: She is alert and oriented to person, place, and time.  Skin: Skin is warm and dry.  Capillary refill takes less than 2 seconds.  Healing wound at old cesarean scar left pelvis under pannus.  No signs of infection.  No draining.  Psychiatric: She has a normal mood and affect. Her behavior is normal.  Nursing note and vitals reviewed.   Assessment and Plan:   Diagnoses and all orders for this visit:  Skin lesion, superficial Comments: Benign. Likely due to mechanical issues. Discussed monitoring. Orders below.  Orders: -     mupirocin ointment (BACTROBAN) 2 %; Place 1 application into the nose 2 (two) times daily.  Obesity (BMI 30-39.9) Comments: Reviewed healthy eating patterns and stress reduction.  Orders: -     phentermine (ADIPEX-P) 37.5 MG tablet; Take 1 tablet (37.5 mg total) by mouth daily before breakfast.    . Reviewed expectations re: course of current medical issues. . Discussed self-management of symptoms. . Outlined signs and symptoms indicating need for more acute intervention. . Patient verbalized understanding and all questions were answered. Marland Kitchen Health Maintenance issues including appropriate healthy diet, exercise, and smoking avoidance were discussed with patient. . See orders for this visit as documented in the electronic medical record. . Patient received an After Visit Summary.  Briscoe Deutscher, DO [place], [facility] 04/27/2018  CMA served as Education administrator during this visit. History, Physical, and Plan performed by medical provider. The above documentation has been reviewed and is accurate and complete. Briscoe Deutscher, D.O.

## 2018-04-27 ENCOUNTER — Encounter: Payer: Self-pay | Admitting: Family Medicine

## 2018-05-09 ENCOUNTER — Telehealth: Payer: Self-pay | Admitting: Family Medicine

## 2018-05-09 NOTE — Telephone Encounter (Signed)
See note

## 2018-05-09 NOTE — Telephone Encounter (Unsigned)
Copied from [place] [id number]. Topic: Quick Communication - See Telephone Encounter >> May 09, 2018  3:58 PM Percell Belt A wrote: CRM for notification. See Telephone encounter for: 05/09/18.  Pt called in stated that Dr Juleen China was going to refer her to a Surgeon at [facility] or [facility].  I did not see a referral in the system.  Pt was calling to check on the status   Best number [phone]

## 2018-05-12 NOTE — Telephone Encounter (Signed)
Please advise do not see anything in last note

## 2018-05-12 NOTE — Telephone Encounter (Signed)
Call patient to ask.

## 2018-05-13 NOTE — Telephone Encounter (Signed)
Patient called and said that she did not know what department she needed to go. That the person from the office that called her was the one that send her. All she knew that she had to go to [facility] general Psychologist, sport and exercise.

## 2018-05-13 NOTE — Telephone Encounter (Signed)
See note

## 2018-05-13 NOTE — Telephone Encounter (Signed)
Called patient she asked for contact number she would like to call to make app with surgeon herself. I have given her the contact number to [facility].  [phone].She will call to make app.

## 2018-05-14 NOTE — Telephone Encounter (Signed)
Not sure who I need to send her to please advise

## 2018-05-16 ENCOUNTER — Other Ambulatory Visit: Payer: Self-pay

## 2018-05-16 DIAGNOSIS — R109 Unspecified abdominal pain: Secondary | ICD-10-CM

## 2018-05-16 NOTE — Telephone Encounter (Signed)
General or plastic surgery.

## 2018-05-18 ENCOUNTER — Other Ambulatory Visit: Payer: Self-pay | Admitting: Family Medicine

## 2018-05-18 DIAGNOSIS — F5102 Adjustment insomnia: Secondary | ICD-10-CM

## 2018-05-19 ENCOUNTER — Other Ambulatory Visit: Payer: Self-pay

## 2018-05-19 DIAGNOSIS — L989 Disorder of the skin and subcutaneous tissue, unspecified: Secondary | ICD-10-CM

## 2018-05-19 NOTE — Telephone Encounter (Signed)
New referral entered 

## 2018-05-19 NOTE — Telephone Encounter (Signed)
Please advise on refill.

## 2018-05-20 ENCOUNTER — Telehealth: Payer: Self-pay | Admitting: Family Medicine

## 2018-05-20 NOTE — Telephone Encounter (Signed)
Copied from [place] [id number]. Topic: Quick Communication - See Telephone Encounter >> May 09, 2018  3:58 PM Percell Belt A wrote: CRM for notification. See Telephone encounter for: 05/09/18.  Pt called in stated that Dr Juleen China was going to refer her to a Surgeon at [facility] or [facility].  I did not see a referral in the system.  Pt was calling to check on the status   Best number [phone]  >> May 20, 2018  2:25 PM Valla Leaver wrote: Jacqlyn Larsen with [facility] calling because they have not received the referral. She would like it to be faxed to either of these fax number. FAX: [phone] or FAX: [phone]. Please send office notes with it.   Referral was placed on 05/16/18 and today. Once referral is worked and sent will update patient and provider/care team.

## 2018-06-09 DIAGNOSIS — Z9049 Acquired absence of other specified parts of digestive tract: Secondary | ICD-10-CM | POA: Insufficient documentation

## 2018-06-09 DIAGNOSIS — E65 Localized adiposity: Secondary | ICD-10-CM | POA: Diagnosis not present

## 2018-06-09 DIAGNOSIS — R198 Other specified symptoms and signs involving the digestive system and abdomen: Secondary | ICD-10-CM | POA: Diagnosis not present

## 2018-06-09 DIAGNOSIS — Z90710 Acquired absence of both cervix and uterus: Secondary | ICD-10-CM | POA: Diagnosis not present

## 2018-06-21 ENCOUNTER — Other Ambulatory Visit: Payer: Self-pay | Admitting: Family Medicine

## 2018-06-21 DIAGNOSIS — F5102 Adjustment insomnia: Secondary | ICD-10-CM

## 2018-06-23 NOTE — Telephone Encounter (Signed)
Last app: 04/25/18  No f/u  Last script 05/19/18 #30 no rf

## 2018-06-26 ENCOUNTER — Encounter: Payer: Self-pay | Admitting: Family Medicine

## 2018-06-26 DIAGNOSIS — Z9049 Acquired absence of other specified parts of digestive tract: Secondary | ICD-10-CM | POA: Diagnosis not present

## 2018-06-26 DIAGNOSIS — E65 Localized adiposity: Secondary | ICD-10-CM | POA: Diagnosis not present

## 2018-06-26 DIAGNOSIS — Z90710 Acquired absence of both cervix and uterus: Secondary | ICD-10-CM | POA: Diagnosis not present

## 2018-06-26 DIAGNOSIS — R198 Other specified symptoms and signs involving the digestive system and abdomen: Secondary | ICD-10-CM | POA: Diagnosis not present

## 2018-07-03 ENCOUNTER — Telehealth: Payer: Self-pay

## 2018-07-03 ENCOUNTER — Other Ambulatory Visit: Payer: Self-pay | Admitting: Family Medicine

## 2018-07-03 NOTE — Telephone Encounter (Signed)
Called patient about son's labs. She states that surgeon we sent to was not able to do surgery that she is needing to have done and has sent her to Dr. Shanon Brow at [facility]. Their office is requesting more records faxed. I am sending notes from 09/24/17 and 11/08/17 to number provided by patient below.  Fax: [phone] att [name]

## 2018-07-18 ENCOUNTER — Encounter: Payer: Self-pay | Admitting: Family Medicine

## 2018-07-18 ENCOUNTER — Ambulatory Visit: Payer: BLUE CROSS/BLUE SHIELD | Admitting: Family Medicine

## 2018-07-18 VITALS — BP 124/82 | HR 100 | Temp 98.6°F | Ht 62.0 in | Wt 144.8 lb

## 2018-07-18 DIAGNOSIS — Z13220 Encounter for screening for lipoid disorders: Secondary | ICD-10-CM | POA: Diagnosis not present

## 2018-07-18 DIAGNOSIS — Z23 Encounter for immunization: Secondary | ICD-10-CM | POA: Diagnosis not present

## 2018-07-18 DIAGNOSIS — F5102 Adjustment insomnia: Secondary | ICD-10-CM | POA: Diagnosis not present

## 2018-07-18 DIAGNOSIS — L989 Disorder of the skin and subcutaneous tissue, unspecified: Secondary | ICD-10-CM | POA: Diagnosis not present

## 2018-07-18 DIAGNOSIS — E669 Obesity, unspecified: Secondary | ICD-10-CM | POA: Diagnosis not present

## 2018-07-18 LAB — LIPID PANEL
Cholesterol: 173 mg/dL (ref 0–200)
HDL: 31.4 mg/dL — ABNORMAL LOW (ref >39.00)
LDL Cholesterol: 111 mg/dL — ABNORMAL HIGH (ref 0–99)
NonHDL: 141.46
Total CHOL/HDL Ratio: 6
Triglycerides: 151 mg/dL — ABNORMAL HIGH (ref 0.0–149.0)
VLDL: 30.2 mg/dL (ref 0.0–40.0)

## 2018-07-18 LAB — CBC WITH DIFFERENTIAL/PLATELET
Basophils Absolute: 0 10*3/uL (ref 0.0–0.1)
Basophils Relative: 0.6 % (ref 0.0–3.0)
Eosinophils Absolute: 0.1 10*3/uL (ref 0.0–0.7)
Eosinophils Relative: 1.1 % (ref 0.0–5.0)
HCT: 40.6 % (ref 36.0–46.0)
Hemoglobin: 13.5 g/dL (ref 12.0–15.0)
Lymphocytes Relative: 33.5 % (ref 12.0–46.0)
Lymphs Abs: 2.2 10*3/uL (ref 0.7–4.0)
MCHC: 33.2 g/dL (ref 30.0–36.0)
MCV: 95.3 fl (ref 78.0–100.0)
Monocytes Absolute: 0.4 10*3/uL (ref 0.1–1.0)
Monocytes Relative: 5.4 % (ref 3.0–12.0)
Neutro Abs: 4 10*3/uL (ref 1.4–7.7)
Neutrophils Relative %: 59.4 % (ref 43.0–77.0)
Platelets: 404 10*3/uL — ABNORMAL HIGH (ref 150.0–400.0)
RBC: 4.26 Mil/uL (ref 3.87–5.11)
RDW: 12.7 % (ref 11.5–15.5)
WBC: 6.7 10*3/uL (ref 4.0–10.5)

## 2018-07-18 LAB — COMPREHENSIVE METABOLIC PANEL
ALT: 7 U/L (ref 0–35)
AST: 7 U/L (ref 0–37)
Albumin: 4.4 g/dL (ref 3.5–5.2)
Alkaline Phosphatase: 71 U/L (ref 39–117)
BUN: 14 mg/dL (ref 6–23)
CO2: 27 mEq/L (ref 19–32)
Calcium: 9.7 mg/dL (ref 8.4–10.5)
Chloride: 107 mEq/L (ref 96–112)
Creatinine, Ser: 0.93 mg/dL (ref 0.40–1.20)
GFR: 68.69 mL/min (ref >60.00)
Glucose, Bld: 86 mg/dL (ref 70–99)
Potassium: 4.2 mEq/L (ref 3.5–5.1)
Sodium: 140 mEq/L (ref 135–145)
Total Bilirubin: 0.8 mg/dL (ref 0.2–1.2)
Total Protein: 6.2 g/dL (ref 6.0–8.3)

## 2018-07-18 MED ORDER — TOPIRAMATE 50 MG PO TABS: 50.0000 mg | ORAL_TABLET | Freq: Two times a day (BID) | ORAL | 3 refills | Status: DC

## 2018-07-18 MED ORDER — LORAZEPAM 1 MG PO TABS: 0.5000 mg | ORAL_TABLET | Freq: Every evening | ORAL | 0 refills | Status: DC | PRN

## 2018-07-18 MED ORDER — MUPIROCIN 2 % EX OINT: 1.0000 "application " | TOPICAL_OINTMENT | Freq: Two times a day (BID) | CUTANEOUS | 0 refills | Status: DC

## 2018-07-18 MED ORDER — PHENTERMINE HCL 37.5 MG PO TABS: 37.5000 mg | ORAL_TABLET | Freq: Every day | ORAL | 2 refills | Status: DC

## 2018-07-18 NOTE — Patient Instructions (Addendum)
YOU HAVE LOST 49 POUNDS!!!!   ....Marland KitchenVaccine Information Statement   Tdap (Tetanus, Diphtheria, Pertussis) Vaccine: What You Need to Know  Many Vaccine Information Statements are available in Spanish and other languages. See AbsolutelyGenuine.com.br. Hojas de Informacin Sobre Vacunas estn disponibles en espaol y en muchos otros idiomas. Visite https://www.martin.org/  1. Why get vaccinated?  Tetanus, diphtheria, and pertussis are very serious diseases. Tdap vaccine can protect [place] from these diseases.  And, Tdap vaccine given to pregnant women can protect newborn babies against pertussis.  TETANUS (Lockjaw) is rare in the [place] today. It causes painful muscle tightening and stiffness, usually all over the body. . It can lead to tightening of muscles in the head and neck so you can't open your mouth, swallow, or sometimes even breathe. Tetanus kills about 1 out of 10 people who are infected even after receiving the best medical care.    DIPHTHERIA is also rare in the [place] today.  It can cause a thick coating to form in the back of the throat. . It can lead to breathing problems, heart failure, paralysis, and death.  PERTUSSIS (Whooping Cough) causes severe coughing spells, which can cause difficulty breathing, vomiting, and disturbed sleep. . It can also lead to weight loss, incontinence, and rib fractures. Up to 2 in 100 adolescents and 5 in 100 adults with pertussis are hospitalized or have complications, which could include pneumonia or death.   These diseases are caused by bacteria. Diphtheria and pertussis are spread from person to person through secretions from coughing or sneezing. Tetanus enters the body through cuts, scratches, or wounds.  Before vaccines, as many as 200,000 cases of diphtheria, 200,000 cases of pertussis, and hundreds of cases of tetanus, were reported in the [place] each year. Since vaccination began, reports of cases for tetanus and  diphtheria have dropped by about 99% and for pertussis by about 80%.  2. Tdap vaccine  Tdap vaccine can protect adolescents and adults from tetanus, diphtheria, and pertussis. One dose of Tdap is routinely given at age 79 or 24.  People who did not get Tdap at that age should get it as soon as possible.  Tdap is especially important for health care professionals and anyone having close contact with a baby younger than 12 months.    Pregnant women should get a dose of Tdap during every pregnancy, to protect the newborn from pertussis.  Infants are most at risk for severe, life-threatening complications from pertussis.  Another vaccine, called Td, protects against tetanus and diphtheria, but not pertussis. A Td booster should be given every 10 years. Tdap may be given as one of these boosters if you have never gotten Tdap before.  Tdap may also be given after a severe cut or burn to prevent tetanus infection.  Your doctor or the person giving you the vaccine can give you more information.  Tdap may safely be given at the same time as other vaccines.  3. Some people should not get this vaccine  ; A person who has ever had a life-threatening allergic reaction after a previous dose of any diphtheria, tetanus or pertussis containing vaccine, OR has a severe allergy to any part of this vaccine, should not get Tdap vaccine.  Tell the person giving the vaccine about any severe allergies.  ; Anyone who had coma or long repeated seizures within 7 days after a childhood dose of DTP or DTaP, or a previous dose of Tdap, should not get Tdap, unless a  cause other than the vaccine was found.  They can still get Td.  ; Talk to your doctor if you: - have seizures or another nervous system problem, - had severe pain or swelling after any vaccine containing diphtheria, tetanus or pertussis,  - ever had a condition called Guillain Barr Syndrome (GBS), - aren't feeling well on the day the shot is  scheduled.  4. Risks  With any medicine, including vaccines, there is a chance of side effects. These are usually mild and go away on their own. Serious reactions are also possible but are rare.   Most people who get Tdap vaccine do not have any problems with it.  Mild Problems following Tdap (Did not interfere with activities) ; Pain where the shot was given (about 3 in 4 adolescents or 2 in 3 adults) ; Redness or swelling where the shot was given (about 1 person in 5) ; Mild fever of at least 100.28F (up to about 1 in 25 adolescents or 1 in 100 adults) ; Headache (about 3 or 4 people in 10) ; Tiredness (about 1 person in 3 or 4) ; Nausea, vomiting, diarrhea, stomach ache (up to 1 in 4 adolescents or 1 in 10 adults) ; Chills,  sore joints (about 1 person in 10) ; Body aches (about 1 person in 3 or 4)  ; Rash, swollen glands (uncommon)  Moderate Problems following Tdap (Interfered with activities, but did not require medical attention) ; Pain where the shot was given (up to 1 in 5 or 6)  ; Redness or swelling where the shot was given (up to about 1 in 16 adolescents or 1 in 12 adults) ; Fever over 102F (about 1 in 100 adolescents or 1 in 250 adults) ; Headache (about 1 in 7 adolescents or 1 in 10 adults) ; Nausea, vomiting, diarrhea, stomach ache (up to 1 or 3 people in 100) ; Swelling of the entire arm where the shot was given (up to about 1 in 500).   Severe Problems following Tdap (Unable to perform usual activities; required medical attention) ; Swelling, severe pain, bleeding, and redness in the arm where the shot was given (rare).  Problems that could happen after any vaccine:  ; People sometimes faint after a medical procedure, including vaccination. Sitting or lying down for about 15 minutes can help prevent fainting, and injuries caused by a fall. Tell your doctor if you feel dizzy, or have vision changes or ringing in the ears.  ; Some people get severe pain in the  shoulder and have difficulty moving the arm where a shot was given. This happens very rarely.  ; Any medication can cause a severe allergic reaction. Such reactions from a vaccine are very rare, estimated at fewer than 1 in a million doses, and would happen within a few minutes to a few hours after the vaccination.   As with any medicine, there is a very remote chance of a vaccine causing a serious injury or death.  The safety of vaccines is always being monitored. For more information, visit: http://www.aguilar.org/  5. What if there is a serious problem?  What should I look for? ; Look for anything that concerns you, such as signs of a severe allergic reaction, very high fever, or unusual behavior.  ; Signs of a severe allergic reaction can include hives, swelling of the face and throat, difficulty breathing, a fast heartbeat, dizziness, and weakness. These would usually start a few minutes to a few hours  after the vaccination.  What should I do? ; If you think it is a severe allergic reaction or other emergency that can't wait, call 9-1-1 or get the person to the nearest hospital. Otherwise, call your doctor.  ; Afterward, the reaction should be reported to the Vaccine Adverse Event Reporting System (VAERS). Your doctor might file this report, or you can do it yourself through the VAERS web site at www.vaers.SamedayNews.es, or by calling (226)659-7164.  VAERS does not give medical advice.  6. The National Vaccine Injury Compensation Program  The [employer] Vaccine Injury Compensation Program (VICP) is a federal program that was created to compensate people who may have been injured by certain vaccines.  Persons who believe they may have been injured by a vaccine can learn about the program and about filing a claim by calling 579-073-6724 or visiting the [place] website at GoldCloset.com.ee. There is a time limit to file a claim for compensation.   7. How can I learn  more?  ; Ask your doctor. He or she can give you the vaccine package insert or suggest other sources of information. ; Call your local or state health department. ; Contact the Centers for Disease Control and Prevention (CDC): - Call 813-733-7309 (1-800-CDC-INFO) or - Visit CDC's website at http://hunter.com/   Vaccine Information Statement  Tdap Vaccine (01/19/2014) 42 U.S.C.  [phone]  Department of Health and Geneticist, molecular for Disease Control and Prevention  Office Use Only

## 2018-07-18 NOTE — Progress Notes (Signed)
Carrie Richards is a 47 y.o. female is here for follow up.  History of Present Illness:   Lonell Grandchild, CMA acting as scribe for Dr. Briscoe Deutscher.   HPI: Patient in office for follow up on weight loss. She is on phentermine. Patient takes daily with no side effects or issues. She has stopped sodas, and decreased starches and increased vegetable intake. She also has moved up dinner times. She will work on increasing exercise. She is starting a 20 minute exercise program at work with a Art therapist.   There are no preventive care reminders to display for this patient. Depression screen [facility] 2/9 06/11/2017 06/18/2015  Decreased Interest 3 0  Down, Depressed, Hopeless 3 0  PHQ - 2 Score 6 0  Altered sleeping 1 -  Tired, decreased energy 3 -  Change in appetite 2 -  Feeling bad or failure about yourself  3 -  Trouble concentrating 1 -  Moving slowly or fidgety/restless 0 -  PHQ-9 Score 16 -  Difficult doing work/chores Somewhat difficult -   PMHx, SurgHx, SocialHx, FamHx, Medications, and Allergies were reviewed in the Visit Navigator and updated as appropriate.   Patient Active Problem List   Diagnosis Date Noted  . 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 Vaginal discharge 06/16/2017  . 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 GERD (gastroesophageal reflux disease) 06/16/2017  . BMI 33.0-33.9,adult 11/20/2013   Social History   Tobacco Use  . Smoking status: Never Smoker  . Smokeless tobacco: Never Used  Substance Use Topics  . Alcohol use: Yes    Alcohol/week: 2.0 standard drinks    Types: 2 Standard drinks or equivalent per week  . Drug use: No   Current Medications and Allergies:   .  buPROPion (WELLBUTRIN XL) 150 MG 24 hr tablet, Take 1 tablet (150 mg total) by mouth daily., Disp: 90 tablet, Rfl: 3 .  LORazepam (ATIVAN) 1 MG tablet, TAKE 1/2 TO 1 TABLET BY MOUTH AT BEDTIME AS NEEDED FOR SLEEP, Disp: 30 tablet, Rfl: 0 .  mupirocin ointment (BACTROBAN) 2 %, Place 1 application into  the nose 2 (two) times daily., Disp: 22 g, Rfl: 0 .  nitrofurantoin, macrocrystal-monohydrate, (MACROBID) 100 MG capsule, Take 1 capsule (100 mg total) by mouth daily., Disp: 90 capsule, Rfl: 3 .  omeprazole (PRILOSEC) 40 MG capsule, Take 1 capsule (40 mg total) by mouth daily., Disp: 30 capsule, Rfl: 3 .  phentermine (ADIPEX-P) 37.5 MG tablet, Take 1 tablet (37.5 mg total) by mouth daily before breakfast., Disp: 30 tablet, Rfl: 2 .  topiramate (TOPAMAX) 50 MG tablet, TAKE 1 TABLET BY MOUTH TWICE A DAY, Disp: 60 tablet, Rfl: 0  No Known Allergies   Review of Systems   Pertinent items are noted in the HPI. Otherwise, ROS is negative.  Vitals:   Vitals:   07/18/18 0846  BP: 124/82  Pulse: 100  Temp: 98.6 F (37 C)  TempSrc: Oral  SpO2: 96%  Weight: 144 lb 12.8 oz (65.7 kg)  Height: 5\' 2"  (1.575 m)     Body mass index is 26.48 kg/m.  Physical Exam:   Physical Exam  Constitutional: She appears well-nourished.  HENT:  Head: Normocephalic and atraumatic.  Eyes: Pupils are equal, round, and reactive to light. EOM are normal.  Neck: Normal range of motion. Neck supple.  Cardiovascular: Normal rate, regular rhythm, normal heart sounds and intact distal pulses.  Pulmonary/Chest: Effort normal.  Abdominal: Soft.  Skin: Skin is warm.  Psychiatric: She has a  normal mood and affect. Her behavior is normal.  Nursing note and vitals reviewed.  Assessment and Plan:   [name] was seen today for follow-up.  Diagnoses and all orders for this visit:  Adjustment insomnia Comments: Ativan q night while going through adjustment.  Orders: -     LORazepam (ATIVAN) 1 MG tablet; Take 0.5-1 tablets (0.5-1 mg total) by mouth at bedtime as needed. for sleep -     CBC with Differential/Platelet  Obesity (BMI 30-39.9) Comments: Reviewed healthy eating patterns and stress reduction.  Orders: -     phentermine (ADIPEX-P) 37.5 MG tablet; Take 1 tablet (37.5 mg total) by mouth daily before  breakfast. -     topiramate (TOPAMAX) 50 MG tablet; Take 1 tablet (50 mg total) by mouth 2 (two) times daily.  Skin lesion, superficial Comments: Skin tear due to pannus. Awaiting insurance approval for surgery.  Orders: -     mupirocin ointment (BACTROBAN) 2 %; Apply 1 application topically 2 (two) times daily. -     CBC with Differential/Platelet -     Comprehensive metabolic panel  Need for Tdap vaccination -     Tdap vaccine greater than or equal to 7yo IM  Screening for lipid disorders -     Comprehensive metabolic panel -     Lipid panel    . Reviewed expectations re: course of current medical issues. . Discussed self-management of symptoms. . Outlined signs and symptoms indicating need for more acute intervention. . Patient verbalized understanding and all questions were answered. Marland Kitchen Health Maintenance issues including appropriate healthy diet, exercise, and smoking avoidance were discussed with patient. . See orders for this visit as documented in the electronic medical record. . Patient received an After Visit Summary.  CMA served as Education administrator during this visit. History, Physical, and Plan performed by medical provider. The above documentation has been reviewed and is accurate and complete. Briscoe Deutscher, D.O.  Briscoe Deutscher, DO [place], [facility] 07/19/2018

## 2018-08-22 ENCOUNTER — Other Ambulatory Visit: Payer: Self-pay | Admitting: Family Medicine

## 2018-08-22 DIAGNOSIS — F5102 Adjustment insomnia: Secondary | ICD-10-CM

## 2018-08-22 NOTE — Telephone Encounter (Signed)
Last office visit: 07/18/18 Last refill: 07/18/18 30 tabs.

## 2018-08-23 ENCOUNTER — Encounter: Payer: Self-pay | Admitting: Family Medicine

## 2018-09-11 DIAGNOSIS — H02834 Dermatochalasis of left upper eyelid: Secondary | ICD-10-CM | POA: Diagnosis not present

## 2018-09-11 DIAGNOSIS — H0279 Other degenerative disorders of eyelid and periocular area: Secondary | ICD-10-CM | POA: Diagnosis not present

## 2018-09-11 DIAGNOSIS — H57813 Brow ptosis, bilateral: Secondary | ICD-10-CM | POA: Diagnosis not present

## 2018-09-11 DIAGNOSIS — H02413 Mechanical ptosis of bilateral eyelids: Secondary | ICD-10-CM | POA: Diagnosis not present

## 2018-09-11 DIAGNOSIS — H02831 Dermatochalasis of right upper eyelid: Secondary | ICD-10-CM | POA: Diagnosis not present

## 2018-09-22 ENCOUNTER — Other Ambulatory Visit: Payer: Self-pay | Admitting: Family Medicine

## 2018-09-22 DIAGNOSIS — F5102 Adjustment insomnia: Secondary | ICD-10-CM

## 2018-09-22 DIAGNOSIS — F329 Major depressive disorder, single episode, unspecified: Secondary | ICD-10-CM

## 2018-09-23 NOTE — Telephone Encounter (Signed)
Last o/v 07/18/18 Follow up 10/20/18 Lorazepam #30 qhs prn on 08/22/18 Ok to refill?

## 2018-09-25 ENCOUNTER — Ambulatory Visit: Payer: BLUE CROSS/BLUE SHIELD | Admitting: Sports Medicine

## 2018-09-25 ENCOUNTER — Encounter: Payer: Self-pay | Admitting: Sports Medicine

## 2018-09-25 VITALS — BP 102/68 | HR 86 | Ht 62.0 in | Wt 142.0 lb

## 2018-09-25 DIAGNOSIS — M9905 Segmental and somatic dysfunction of pelvic region: Secondary | ICD-10-CM

## 2018-09-25 DIAGNOSIS — M545 Low back pain, unspecified: Secondary | ICD-10-CM

## 2018-09-25 DIAGNOSIS — E669 Obesity, unspecified: Secondary | ICD-10-CM | POA: Diagnosis not present

## 2018-09-25 MED ORDER — METHYLPREDNISOLONE ACETATE 80 MG/ML IJ SUSP
80.0000 mg | Freq: Once | INTRAMUSCULAR | Status: AC
  Administered 2018-09-25: 80 mg via INTRAMUSCULAR

## 2018-09-25 MED ORDER — KETOROLAC TROMETHAMINE 60 MG/2ML IM SOLN
60.0000 mg | Freq: Once | INTRAMUSCULAR | Status: AC
  Administered 2018-09-25: 60 mg via INTRAMUSCULAR

## 2018-09-25 MED ORDER — CYCLOBENZAPRINE HCL 10 MG PO TABS: 10.0000 mg | ORAL_TABLET | Freq: Three times a day (TID) | ORAL | 1 refills | Status: DC | PRN

## 2018-09-25 MED ORDER — METHYLPREDNISOLONE 4 MG PO TBPK: ORAL_TABLET | ORAL | 0 refills | Status: DC

## 2018-09-25 NOTE — Progress Notes (Signed)
Carrie Richards. [facility] at [facility] [phone]  Carrie Richards - 47 y.o. female MRN [id number]  Date of birth: 02/09/1971  Visit Date: 09/25/2018  PCP: [name], DO   Referred by: [name], DO   Scribe(s) for today's visit: Christoper Fabian, LAT, ATC  SUBJECTIVE:  Carrie Richards is here for New Patient (Initial Visit) (Low back pain)    HPI: Her low back pain symptoms INITIALLY: Began 3 days ago w/ no known MOI.  Pt states that she did clean a house that day and noticed the pain after this. Described as severe sharp pain w/ some burning w/ prolonged sitting, radiating to B LEs (R>L) from hip to knee Worsened with static sitting or standing Improved with heat Additional associated symptoms include: radiating pain into B LEs, no N/T noted in B LEs; increased pain noted w/ increased intrabdominal pressure    At this time symptoms show no change compared to onset. She has been taking some leftover Flexeril and IBU 800 mg.  She states that the Flexeril doesn't help w/ the pain, just makes her feel sleepy.  REVIEW OF SYSTEMS: Reports night time disturbances. Denies fevers, chills, or night sweats. Denies unexplained weight loss. Denies personal history of cancer. Denies changes in bowel or bladder habits. Denies recent unreported falls. Denies new or worsening dyspnea or wheezing. Denies headaches or dizziness.  Denies numbness, tingling or weakness  In the extremities.  Denies dizziness or presyncopal episodes Denies lower extremity edema    HISTORY:  Prior history reviewed and updated per electronic medical record.  Social History   Occupational History  . Occupation: Music therapist: [employer]  Tobacco Use  . Smoking status: Never Smoker  . Smokeless tobacco: Never Used  Substance and Sexual Activity  . Alcohol use: Yes    Alcohol/week: 2.0 standard drinks    Types: 2 Standard drinks  or equivalent per week  . Drug use: No  . Sexual activity: Yes    Partners: Male    Birth control/protection: Surgical   Social History   Social History Narrative  . Not on file    Past Medical History:  Diagnosis Date  . Anemia   . Anxiety   . 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 Vaginal discharge 06/16/2017   Past Surgical History:  Procedure Laterality Date  . ABDOMINAL HYSTERECTOMY    . CESAREAN SECTION    . CHOLECYSTECTOMY    . WRIST SURGERY Right    family history includes Breast cancer in her mother and paternal grandmother; Cancer in her mother; Depression in her sister and sister; Diabetes in her mother; Heart attack in her maternal grandmother; Heart disease in her sister; Hyperlipidemia in her father; Hypertension in her father, sister, and sister; Melanoma in her father; Ovarian cancer in her mother.  DATA OBTAINED & REVIEWED:  No results for input(s): HGBA1C, LABURIC, CREATINE in the last 8760 hours. No problems updated. . 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  Otherwise mild degenerative changes of the lumbar spine. .   OBJECTIVE:  VS:  HT:5\' 2"  (157.5 cm)   WT:142 lb (64.4 kg)  BMI:25.97    BP:102/68  HR:86bpm  TEMP: ( )  RESP:96 %   PHYSICAL EXAM: CONSTITUTIONAL: Well-developed, Well-nourished and In no acute distress PSYCHIATRIC: Alert & appropriately interactive. and Not depressed or  anxious appearing. RESPIRATORY: No increased work of breathing and Trachea Midline EYES: Pupils are equal., EOM intact without nystagmus. and No scleral icterus.  VASCULAR EXAM: Warm and well perfused NEURO: unremarkable Normal associated myotomal distribution strength to manual muscle testing Normal sensation to light touch  MSK Exam: BACK Exam: Normal alignment & Contours Skin: No overlying erythema/ecchymosis  MOTOR TESTING: Intact in all LE myotomes and Able to heel and  toe walk without difficutly        RIGHT    LEFT Straight leg raise-------------------------: normal, no pain, Tight but no radiating pain                         normal, no pain Popliteal compression test------------: normal, no pain                         normal, no pain Greater sciatic notch tenderness----: normal, no pain                         normal, no pain   ASSESSMENT   1. Acute right-sided low back pain without sciatica   2. Obesity (BMI 30-39.9)   3. Somatic dysfunction of pelvis region     PLAN:  Pertinent additional documentation may be included in corresponding procedure notes, imaging studies, problem based documentation and patient instructions.  Procedures:  . Osteopathic manipulation was performed today based on physical exam findings.  Please see procedure note for further information including Osteopathic Exam findings  Medications:  Meds ordered this encounter  Medications  . methylPREDNISolone (MEDROL DOSEPAK) 4 MG TBPK tablet    Sig: Take by mouth as directed. Take 6 tablets on the first day prescribed then as directed.    Dispense:  21 tablet    Refill:  0  . cyclobenzaprine (FLEXERIL) 10 MG tablet    Sig: Take 1 tablet (10 mg total) by mouth 3 (three) times daily as needed for muscle spasms.    Dispense:  30 tablet    Refill:  1  . methylPREDNISolone acetate (DEPO-MEDROL) injection 80 mg  . ketorolac (TORADOL) injection 60 mg   Discussion/Instructions: No problem-specific Assessment & Plan notes found for this encounter.  . Functional low back pain secondary to overuse and tight hip flexors.  IM injection provided today, muscle relaxers and steroid taper.  Follow-up in 2 weeks for consideration of repeat OMT and initiation of home exercise program. . Discussed red flag symptoms that warrant earlier emergent evaluation and patient voices understanding. . Activity modifications and the importance of avoiding exacerbating activities (limiting pain  to no more than a 4 / 10 during or following activity) recommended and discussed.  Follow-up:  . Return in about 2 weeks (around 10/09/2018).  . At follow up will plan to consider: repeat osteopathic manipulation     CMA/ATC served as scribe during this visit. History, Physical, and Plan performed by medical provider. Documentation and orders reviewed and attested to.      Andrena Mews, DO    [facility] Physician

## 2018-09-25 NOTE — Progress Notes (Signed)
PROCEDURE NOTE : OSTEOPATHIC MANIPULATION The decision today to treat with Osteopathic Manipulative Therapy (OMT) was based on physical exam findings. Verbal consent was obtained following a discussion with the patient regarding the of risks, benefits and potential side effects, including an acute pain flare,post manipulation soreness and need for repeat treatments.     Contraindications to OMT: NONE  Manipulation was performed as below: Regions Treated OMT Techniques Used  Pelvis Muscle energy and long lever release   The patient tolerated the treatment well and reported Improved symptoms following treatment today. Patient was given medications, exercises, stretches and lifestyle modifications per AVS and verbally.   OSTEOPATHIC/STRUCTURAL EXAM:   Right anterior innonimate

## 2018-09-29 ENCOUNTER — Other Ambulatory Visit: Payer: Self-pay | Admitting: Family Medicine

## 2018-09-29 DIAGNOSIS — E669 Obesity, unspecified: Secondary | ICD-10-CM

## 2018-09-29 NOTE — Telephone Encounter (Signed)
Patient still has refills left at pharmacy. I have spoke with pharmacy.

## 2018-10-03 DIAGNOSIS — H53483 Generalized contraction of visual field, bilateral: Secondary | ICD-10-CM | POA: Diagnosis not present

## 2018-10-07 ENCOUNTER — Other Ambulatory Visit: Payer: Self-pay

## 2018-10-07 DIAGNOSIS — E669 Obesity, unspecified: Secondary | ICD-10-CM

## 2018-10-07 MED ORDER — TOPIRAMATE 50 MG PO TABS: 50.0000 mg | ORAL_TABLET | Freq: Two times a day (BID) | ORAL | 3 refills | Status: DC

## 2018-10-09 ENCOUNTER — Ambulatory Visit: Payer: BLUE CROSS/BLUE SHIELD | Admitting: Sports Medicine

## 2018-10-09 ENCOUNTER — Encounter: Payer: Self-pay | Admitting: Sports Medicine

## 2018-10-09 VITALS — BP 102/66 | HR 96 | Ht 62.0 in | Wt 143.6 lb

## 2018-10-09 DIAGNOSIS — M9905 Segmental and somatic dysfunction of pelvic region: Secondary | ICD-10-CM

## 2018-10-09 DIAGNOSIS — M9903 Segmental and somatic dysfunction of lumbar region: Secondary | ICD-10-CM

## 2018-10-09 DIAGNOSIS — M9902 Segmental and somatic dysfunction of thoracic region: Secondary | ICD-10-CM | POA: Diagnosis not present

## 2018-10-09 DIAGNOSIS — M545 Low back pain, unspecified: Secondary | ICD-10-CM

## 2018-10-09 DIAGNOSIS — M9908 Segmental and somatic dysfunction of rib cage: Secondary | ICD-10-CM

## 2018-10-09 NOTE — Patient Instructions (Addendum)
  Also check out [employer]" which is a program developed by Dr. Minerva Ends.   There are links to a couple of his YouTube Videos below and I would like to see performing one of his videos 5-6 days per week.    A good intro video is: "Independence from Pain 7-minute Video" - [email or web address]   Exercises that focus more on the neck are as below: Dr. Archie Balboa with [facility] teaching neck and shoulder details Part 1 - [email or web address] Part 2 Dr. Archie Balboa with [facility] quick routine to practice daily - [email or web address]  Do not try to attempt the entire video when first beginning.    Try breaking of each exercise that he goes into shorter segments.  Otherwise if they perform an exercise for 45 seconds, start with 15 seconds and rest and then resume when they begin the new activity.  If you work your way up to being able to do these videos without having to stop, I expect you will see significant improvements in your pain.  If you enjoy his videos and would like to find out more you can look on his website: [email or web address]  He has a workout streaming option as well as a DVD set available for purchase.  Amazon has the best price for his DVDs.      Please perform the exercise program that we have prepared for you and gone over in detail on a daily basis.  In addition to the handout you were provided you can access your program through: [email or web address]   Your unique program code is:  [email or web address]

## 2018-10-09 NOTE — Progress Notes (Signed)
Carrie Richards. [facility] at [facility] [phone]  BELIEVE [name] - 47 y.o. female MRN [id number]  Date of birth: Mar 07, 1971  Visit Date: 10/09/2018  PCP: [name], DO   Referred by: [name], DO   Scribe(s) for today's visit: Stevenson Clinch, CMA  SUBJECTIVE:  Carrie Richards is here for Follow-up (LBP)    HPI: 09/25/2018: Her low back pain symptoms INITIALLY: Began 3 days ago w/ no known MOI.  Pt states that she did clean a house that day and noticed the pain after this. Described as severe sharp pain w/ some burning w/ prolonged sitting, radiating to B LEs (R>L) from hip to knee Worsened with static sitting or standing Improved with heat Additional associated symptoms include: radiating pain into B LEs, no N/T noted in B LEs; increased pain noted w/ increased intrabdominal pressure   At this time symptoms show no change compared to onset. She has been taking some leftover Flexeril and IBU 800 mg.  She states that the Flexeril doesn't help w/ the pain, just makes her feel sleepy.  10/09/2018: Compared to the last office visit, her previously described symptoms are improving.  Current symptoms are mild-moderate sharp pain & are radiating to the R leg.  She has been prescribed Flexeril and Medrol Dosepak. She completed Medrol Dosepak and feels that it was beneficial. She hasn't taken Flexeril in 3-4 days but it was beneficial when she was taking it. She has been taking IBU prn with some relief.  She received IM Depo-Medrol and Toradol and tolerated well, reports good relief.  She received OMT at her last visit and responded well.    REVIEW OF SYSTEMS: Reports night time disturbances, less often and less severe. Denies fevers, chills, or night sweats. Denies unexplained weight loss. Denies personal history of cancer. Denies changes in bowel or bladder habits. Denies recent unreported falls. Denies new or worsening  dyspnea or wheezing. Denies headaches or dizziness.  Denies numbness, tingling or weakness in the extremities.  Denies dizziness or presyncopal episodes Denies lower extremity edema    HISTORY:  Prior history reviewed and updated per electronic medical record.  Social History   Occupational History  . Occupation: Music therapist: [employer]  Tobacco Use  . Smoking status: Never Smoker  . Smokeless tobacco: Never Used  Substance and Sexual Activity  . Alcohol use: Yes    Alcohol/week: 2.0 standard drinks    Types: 2 Standard drinks or equivalent per week  . Drug use: No  . Sexual activity: Yes    Partners: Male    Birth control/protection: Surgical   Social History   Social History Narrative  . Not on file    Past Medical History:  Diagnosis Date  . Anemia   . Anxiety   . 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 Vaginal discharge 06/16/2017   Past Surgical History:  Procedure Laterality Date  . ABDOMINAL HYSTERECTOMY    . CESAREAN SECTION    . CHOLECYSTECTOMY    . WRIST SURGERY Right    family history includes Breast cancer in her mother and paternal grandmother; Cancer in her mother; Depression in her sister and sister; Diabetes in her mother; Heart attack in her maternal grandmother; Heart disease in her sister; Hyperlipidemia in her father; Hypertension in her father, sister, and sister; Melanoma in her father; Ovarian cancer in her mother.  DATA OBTAINED & REVIEWED:  No results for input(s): HGBA1C,  LABURIC, CREATINE in the last 8760 hours. No problems updated. . 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  Otherwise mild degenerative changes of the lumbar spine. .   OBJECTIVE:  VS:  HT:5\' 2"  (157.5 cm)   WT:143 lb 9.6 oz (65.1 kg)  BMI:26.26    BP:102/66  HR:96bpm  TEMP: ( )  RESP:98 %   PHYSICAL EXAM: CONSTITUTIONAL: Well-developed, Well-nourished  and In no acute distress PSYCHIATRIC: Alert & appropriately interactive. and Not depressed or anxious appearing. RESPIRATORY: No increased work of breathing and Trachea Midline EYES: Pupils are equal., EOM intact without nystagmus. and No scleral icterus.  VASCULAR EXAM: Warm and well perfused NEURO: unremarkable Normal associated myotomal distribution strength to manual muscle testing Normal sensation to light touch  MSK Exam: BACK Exam: Normal alignment & Contours Skin: No overlying erythema/ecchymosis  MOTOR TESTING: Intact in all LE myotomes and Able to heel and toe walk without difficutly        RIGHT    LEFT Straight leg raise-------------------------: normal, no pain, Tight but no radiating pain                         normal, no pain Popliteal compression test------------: normal, no pain                         normal, no pain Greater sciatic notch tenderness----: normal, no pain                         normal, no pain   ASSESSMENT   1. Acute right-sided low back pain without sciatica   2. Somatic dysfunction of thoracic region   3. Somatic dysfunction of lumbar region   4. Somatic dysfunction of pelvis region   5. Somatic dysfunction of rib cage region     PLAN:  Pertinent additional documentation may be included in corresponding procedure notes, imaging studies, problem based documentation and patient instructions.  Procedures:  . Osteopathic manipulation was performed today based on physical exam findings.  Please see procedure note for further information including Osteopathic Exam findings . Discussed the foundation of treatment for this condition is physical therapy and/or daily (5-6 days/week) therapeutic exercises, focusing on core strengthening, coordination, neuromuscular control/reeducation.  Therapeutic exercises prescribed per procedure note.  Medications:  No orders of the defined types were placed in this encounter.  Discussion/Instructions: No  problem-specific Assessment & Plan notes found for this encounter.  . Functional low back pain secondary to overuse and tight hip flexors.  Previously responded well to osteopathic manipulation and medications. . Initiation of home exercise program based on exam findings. . Links to [employer] provided today per Patient Instructions.  These exercises were developed by Myles Lipps, DC with a strong emphasis on core neuromuscular reducation and postural realignment through body-weight exercises. . Discussed red flag symptoms that warrant earlier emergent evaluation and patient voices understanding. . Activity modifications and the importance of avoiding exacerbating activities (limiting pain to no more than a 4 / 10 during or following activity) recommended and discussed.  Follow-up:  . Return in about 4 weeks (around 11/06/2018) for consideration of repeat Osteopathic Manipulation.  . At follow up will plan to consider: to consider repeat osteopathic manipulation     CMA/ATC served as scribe during this visit. History, Physical, and Plan  performed by medical provider. Documentation and orders reviewed and attested to.      Andrena Mews, DO    [facility] Phys[facility]

## 2018-10-09 NOTE — Progress Notes (Signed)
PROCEDURE NOTE: THERAPEUTIC EXERCISES (97110) 15 minutes spent for Therapeutic exercises as below and as referenced in the AVS.  This included exercises focusing on stretching, strengthening, with significant focus on eccentric aspects.   Proper technique shown and discussed handout in great detail with ATC.  All questions were discussed and answered.   Long term goals include an improvement in range of motion, strength, endurance as well as avoiding reinjury. Frequency of visits is one time as determined during today's  office visit. Frequency of exercises to be performed is as per handout.  EXERCISES REVIEWED:  Archie Balboa Exercises  Thoracic Mobility  Pelvic recruitment/tilting

## 2018-10-09 NOTE — Progress Notes (Signed)
PROCEDURE NOTE : OSTEOPATHIC MANIPULATION The decision today to treat with Osteopathic Manipulative Therapy (OMT) was based on physical exam findings. Verbal consent was obtained following a discussion with the patient regarding the of risks, benefits and potential side effects, including an acute pain flare,post manipulation soreness and need for repeat treatments.     Contraindications to OMT: NONE  Manipulation was performed as below: Regions Treated OMT Techniques Used  Thoracic spine Ribs Lumbar spine Pelvis Sacrum HVLA muscle energy myofascial release soft tissue   The patient tolerated the treatment well and reported Improved symptoms following treatment today. Patient was given medications, exercises, stretches and lifestyle modifications per AVS and verbally.   OSTEOPATHIC/STRUCTURAL EXAM:   T6 -10 Neutral, Rotated LEFT, Sidebent RIGHT Rib 8 Right  Posterior L4 FRS right (Flexed, Rotated & Sidebent) Right psoas spasm Right anterior innonimate L on L sacral torsion

## 2018-10-11 ENCOUNTER — Encounter: Payer: Self-pay | Admitting: Sports Medicine

## 2018-10-17 ENCOUNTER — Encounter: Payer: Self-pay | Admitting: Family Medicine

## 2018-10-17 ENCOUNTER — Ambulatory Visit: Payer: BLUE CROSS/BLUE SHIELD | Admitting: Family Medicine

## 2018-10-17 VITALS — BP 104/62 | HR 85 | Temp 98.6°F | Ht 62.0 in | Wt 144.0 lb

## 2018-10-17 DIAGNOSIS — F5102 Adjustment insomnia: Secondary | ICD-10-CM

## 2018-10-17 DIAGNOSIS — E669 Obesity, unspecified: Secondary | ICD-10-CM

## 2018-10-17 MED ORDER — PHENTERMINE HCL 37.5 MG PO TABS: 37.5000 mg | ORAL_TABLET | Freq: Every day | ORAL | 2 refills | Status: DC

## 2018-10-17 MED ORDER — DOXYCYCLINE HYCLATE 100 MG PO TABS: 100.0000 mg | ORAL_TABLET | Freq: Two times a day (BID) | ORAL | 0 refills | Status: DC

## 2018-10-17 MED ORDER — LORAZEPAM 1 MG PO TABS: 0.5000 mg | ORAL_TABLET | Freq: Every evening | ORAL | 0 refills | Status: DC | PRN

## 2018-10-17 NOTE — Progress Notes (Signed)
Carrie Richards is a 47 y.o. female is here for follow up.  History of Present Illness:   Lonell Grandchild, CMA acting as scribe for Dr. Briscoe Deutscher.   HPI:   Adjustment insomnia Ativan q night while going through adjustment. She has been taking nightly she is for the most part having 5-6 hours of uninterrupted sleep during that time. She is taking one tablet.   Obesity (BMI 30-39.9) Patient is currently on  phentermine 37.5 MG tablet and TOPAMAX 50 MG tablet; Take 1 tablet (50 mg total) by mouth 2 (two) times daily. She takes medications daily as directed with no side effects.  She has not been exercising due to back pain. She has started doing daily PT exercises. She is working on calorie and portion control she has gained one pound from last visit.    There are no preventive care reminders to display for this patient.   Depression screen [facility] 2/9 10/17/2018 06/11/2017 06/18/2015  Decreased Interest 0 3 0  Down, Depressed, Hopeless 1 3 0  PHQ - 2 Score 1 6 0  Altered sleeping 1 1 -  Tired, decreased energy 1 3 -  Change in appetite 0 2 -  Feeling bad or failure about yourself  0 3 -  Trouble concentrating 1 1 -  Moving slowly or fidgety/restless 1 0 -  Suicidal thoughts 0 - -  PHQ-9 Score 5 16 -  Difficult doing work/chores Not difficult at all Somewhat difficult -   PMHx, SurgHx, SocialHx, FamHx, Medications, and Allergies were reviewed in the Visit Navigator and updated as appropriate.   Patient Active Problem List   Diagnosis Date Noted  . 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 Vaginal discharge 06/16/2017  . 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 GERD (gastroesophageal reflux disease) 06/16/2017  . BMI 33.0-33.9,adult 11/20/2013   Social History   Tobacco Use  . Smoking status: Never Smoker  . Smokeless tobacco: Never Used  Substance Use Topics  . Alcohol use: Yes    Alcohol/week: 2.0 standard drinks    Types: 2 Standard drinks or equivalent per week  . Drug use: No    Current Medications and Allergies:   Current Outpatient Medications:  .  buPROPion (WELLBUTRIN XL) 150 MG 24 hr tablet, TAKE 1 TABLET BY MOUTH EVERY DAY, Disp: 90 tablet, Rfl: 3 .  cyclobenzaprine (FLEXERIL) 10 MG tablet, Take 1 tablet (10 mg total) by mouth 3 (three) times daily as needed for muscle spasms., Disp: 30 tablet, Rfl: 1 .  LORazepam (ATIVAN) 1 MG tablet, TAKE 1/2 TO 1 TABLET BY MOUTH AT BEDTIME AS NEEDED FOR SLEEP, Disp: 30 tablet, Rfl: 0 .  mupirocin ointment (BACTROBAN) 2 %, Apply 1 application topically 2 (two) times daily., Disp: 22 g, Rfl: 0 .  nitrofurantoin, macrocrystal-monohydrate, (MACROBID) 100 MG capsule, Take 1 capsule (100 mg total) by mouth daily., Disp: 90 capsule, Rfl: 3 .  phentermine (ADIPEX-P) 37.5 MG tablet, Take 1 tablet (37.5 mg total) by mouth daily before breakfast., Disp: 30 tablet, Rfl: 2 .  topiramate (TOPAMAX) 50 MG tablet, Take 1 tablet (50 mg total) by mouth 2 (two) times daily., Disp: 60 tablet, Rfl: 3  No Known Allergies   Review of Systems   Pertinent items are noted in the HPI. Otherwise, ROS is negative.  Vitals:   Vitals:   10/17/18 1005  BP: 104/62  Pulse: 85  Temp: 98.6 F (37 C)  TempSrc: Oral  SpO2: 98%  Weight: 144 lb (65.3 kg)  Height:  5\' 2"  (1.575 m)     Body mass index is 26.34 kg/m.  Physical Exam:   Physical Exam  Constitutional: She appears well-nourished.  HENT:  Head: Normocephalic and atraumatic.  Eyes: Pupils are equal, round, and reactive to light. EOM are normal.  Neck: Normal range of motion. Neck supple.  Cardiovascular: Normal rate, regular rhythm, normal heart sounds and intact distal pulses.  Pulmonary/Chest: Effort normal.  Abdominal: Soft.  Skin: Skin is warm.  Psychiatric: She has a normal mood and affect. Her behavior is normal.  Nursing note and vitals reviewed.  Assessment and Plan:   [name] was seen today for follow-up.  Diagnoses and all orders for this visit:  Obesity (BMI  30-39.9) Comments: Reviewed healthy eating patterns and stress reduction.  Orders: -     phentermine (ADIPEX-P) 37.5 MG tablet; Take 1 tablet (37.5 mg total) by mouth daily before breakfast.  Adjustment insomnia Comments: Ativan q night while going through adjustment.  Orders: -     LORazepam (ATIVAN) 1 MG tablet; Take 0.5-1 tablets (0.5-1 mg total) by mouth at bedtime as needed. for sleep   . Reviewed expectations re: course of current medical issues. . Discussed self-management of symptoms. . Outlined signs and symptoms indicating need for more acute intervention. . Patient verbalized understanding and all questions were answered. Marland Kitchen Health Maintenance issues including appropriate healthy diet, exercise, and smoking avoidance were discussed with patient. . See orders for this visit as documented in the electronic medical record. . Patient received an After Visit Summary.  CMA served as Education administrator during this visit. History, Physical, and Plan performed by medical provider. The above documentation has been reviewed and is accurate and complete. Briscoe Deutscher, D.O.  Briscoe Deutscher, DO [place], [facility] 10/17/2018

## 2018-10-20 ENCOUNTER — Ambulatory Visit: Payer: BLUE CROSS/BLUE SHIELD | Admitting: Family Medicine

## 2018-11-06 ENCOUNTER — Ambulatory Visit: Payer: BLUE CROSS/BLUE SHIELD | Admitting: Sports Medicine

## 2018-11-10 ENCOUNTER — Ambulatory Visit: Payer: BLUE CROSS/BLUE SHIELD | Admitting: Family Medicine

## 2018-11-10 ENCOUNTER — Encounter: Payer: Self-pay | Admitting: Family Medicine

## 2018-11-10 VITALS — BP 100/78 | HR 92 | Temp 97.9°F | Ht 62.0 in | Wt 139.6 lb

## 2018-11-10 DIAGNOSIS — J069 Acute upper respiratory infection, unspecified: Secondary | ICD-10-CM | POA: Diagnosis not present

## 2018-11-10 DIAGNOSIS — B9789 Other viral agents as the cause of diseases classified elsewhere: Secondary | ICD-10-CM | POA: Diagnosis not present

## 2018-11-10 MED ORDER — GUAIFENESIN-CODEINE 100-10 MG/5ML PO SOLN: 10.0000 mL | ORAL | 0 refills | Status: DC | PRN

## 2018-11-10 MED ORDER — BENZONATATE 200 MG PO CAPS: 200.0000 mg | ORAL_CAPSULE | Freq: Two times a day (BID) | ORAL | 1 refills | Status: DC | PRN

## 2018-11-10 NOTE — Patient Instructions (Signed)
Cool mist humdifier at night flonase at night Robitussin DM during the day Codeine cough syrup at night or during day as well. May make you drowsy Honey daily  **if continued fever, worsening cough, worsening symptoms please let us know.   Viral Respiratory Infection A viral respiratory infection is an illness that affects parts of the body used for breathing, like the lungs, nose, and throat. It is caused by a germ called a virus. Some examples of this kind of infection are:  A cold.  The flu (influenza).  A respiratory syncytial virus (RSV) infection.  How do I know if I have this infection? Most of the time this infection causes:  A stuffy or runny nose.  Yellow or green fluid in the nose.  A cough.  Sneezing.  Tiredness (fatigue).  Achy muscles.  A sore throat.  Sweating or chills.  A fever.  A headache.  How is this infection treated? If the flu is diagnosed early, it may be treated with an antiviral medicine. This medicine shortens the length of time a person has symptoms. Symptoms may be treated with over-the-counter and prescription medicines, such as:  Expectorants. These make it easier to cough up mucus.  Decongestant nasal sprays.  Doctors do not prescribe antibiotic medicines for viral infections. They do not work with this kind of infection. How do I know if I should stay home? To keep others from getting sick, stay home if you have:  A fever.  A lasting cough.  A sore throat.  A runny nose.  Sneezing.  Muscles aches.  Headaches.  Tiredness.  Weakness.  Chills.  Sweating.  An upset stomach (nausea).  Follow these instructions at home:  Rest as much as possible.  Take over-the-counter and prescription medicines only as told by your doctor.  Drink enough fluid to keep your pee (urine) clear or pale yellow.  Gargle with salt water. Do this 3-4 times per day or as needed. To make a salt-water mixture, dissolve -1 tsp of  salt in 1 cup of warm water. Make sure the salt dissolves all the way.  Use nose drops made from salt water. This helps with stuffiness (congestion). It also helps soften the skin around your nose.  Do not drink alcohol.  Do not use tobacco products, including cigarettes, chewing tobacco, and e-cigarettes. If you need help quitting, ask your doctor. Get help if:  Your symptoms last for 10 days or longer.  Your symptoms get worse over time.  You have a fever.  You have very bad pain in your face or forehead.  Parts of your jaw or neck become very swollen. Get help right away if:  You feel pain or pressure in your chest.  You have shortness of breath.  You faint or feel like you will faint.  You keep throwing up (vomiting).  You feel confused. This information is not intended to replace advice given to you by your health care provider. Make sure you discuss any questions you have with your health care provider. Document Released: 10/25/2008 Document Revised: 04/19/2016 Document Reviewed: 04/20/2015 Elsevier Interactive Patient Education  2018 [employer].

## 2018-11-10 NOTE — Progress Notes (Signed)
Patient: Carrie Richards MRN: [id number] DOB: 12/18/1970 PCP: [name], DO     Subjective:  Chief Complaint  Patient presents with  . Cough  . Generalized Body Aches    pt is refusing flu test  . sinus congestion    HPI: The patient is a 47 y.o. female who presents today for 4 day history of cough, fever to 101, body aches and sore throat that is more scratchy. Cough usually dry. No shortness of breath or wheezing. She does not smoke and no hx of asthma. +sick contacts with kids who have bronchitis and viral issues. She is doing a decongestant. She is still having headaches, but cough is better during the day. Cough worse at night. No fever today, but has been taking ibuprofen every 4-6 hours. Denies any n/v/d.   Review of Systems  Constitutional: Positive for fever. Negative for chills and fatigue.  HENT: Positive for congestion, postnasal drip, rhinorrhea and sore throat. Negative for ear pain, facial swelling, sinus pressure and sinus pain.   Respiratory: Positive for cough. Negative for shortness of breath.   Cardiovascular: Positive for chest pain.       Chest hurts with coughing  Gastrointestinal: Negative for abdominal pain, constipation, diarrhea, nausea and vomiting.  Musculoskeletal: Positive for myalgias. Negative for back pain and neck pain.       C/o generalized body aches all over  Skin: Negative.   Neurological: Positive for headaches. Negative for dizziness.  Psychiatric/Behavioral: Positive for sleep disturbance.    Allergies Patient has No Known Allergies.  Past Medical History Patient  has a past medical history of Anemia, Anxiety, Reactive depression (06/16/2017), and Vaginal discharge (06/16/2017).  Surgical History Patient  has a past surgical history that includes Cesarean section; Cholecystectomy; Abdominal hysterectomy; and Wrist surgery (Right).  Family History Pateint's family history includes Breast cancer in her mother and paternal  grandmother; Cancer in her mother; Depression in her sister and sister; Diabetes in her mother; Heart attack in her maternal grandmother; Heart disease in her sister; Hyperlipidemia in her father; Hypertension in her father, sister, and sister; Melanoma in her father; Ovarian cancer in her mother.  Social History Patient  reports that she has never smoked. She has never used smokeless tobacco. She reports current alcohol use of about 2.0 standard drinks of alcohol per week. She reports that she does not use drugs.    Objective: Vitals:   11/10/18 1146  BP: 100/78  Pulse: 92  Temp: 97.9 F (36.6 C)  TempSrc: Oral  SpO2: 98%  Weight: 139 lb 9.6 oz (63.3 kg)  Height: 5\' 2"  (1.575 m)    Body mass index is 25.53 kg/m.  Physical Exam Vitals signs reviewed.  Constitutional:      General: She is not in acute distress.    Appearance: Normal appearance. She is not ill-appearing.  HENT:     Head:     Comments: No ttp over sinuses     Right Ear: Tympanic membrane, ear canal and external ear normal.     Left Ear: Tympanic membrane, ear canal and external ear normal.     Nose: Nose normal.     Mouth/Throat:     Mouth: Mucous membranes are moist.     Pharynx: No oropharyngeal exudate.  Eyes:     Extraocular Movements: Extraocular movements intact.     Pupils: Pupils are equal, round, and reactive to light.  Cardiovascular:     Rate and Rhythm: Normal rate and regular rhythm.  Heart sounds: Normal heart sounds.  Pulmonary:     Effort: Pulmonary effort is normal. No respiratory distress.     Breath sounds: Normal breath sounds. No wheezing or rales.  Abdominal:     General: Abdomen is flat. Bowel sounds are normal.     Palpations: Abdomen is soft.  Skin:    Capillary Refill: Capillary refill takes less than 2 seconds.  Neurological:     General: No focal deficit present.     Mental Status: She is alert and oriented to person, place, and time.  Psychiatric:        Mood and  Affect: Mood normal.        Behavior: Behavior normal.        Assessment/plan: 1. Viral URI with cough Discussed exam normal. No indication for abx at this time. Viral in nature and will let it run it's course. Conservative therapy with rest, fluids, robitussin DM prn, flonase at night, honey daily. Sending in tessalon pearls and codeine cough syrup for her as well. If any worsening symptoms, continued fever she is to return or let us know.    Return if symptoms worsen or fail to improve.   Orland Mustard, MD [place] [facility]   11/10/2018

## 2018-11-13 ENCOUNTER — Encounter: Payer: Self-pay | Admitting: Sports Medicine

## 2018-11-13 ENCOUNTER — Ambulatory Visit: Payer: BLUE CROSS/BLUE SHIELD | Admitting: Sports Medicine

## 2018-11-13 ENCOUNTER — Other Ambulatory Visit (INDEPENDENT_AMBULATORY_CARE_PROVIDER_SITE_OTHER): Payer: BLUE CROSS/BLUE SHIELD

## 2018-11-13 ENCOUNTER — Other Ambulatory Visit: Payer: Self-pay

## 2018-11-13 VITALS — BP 110/72 | HR 80 | Ht 62.0 in | Wt 140.6 lb

## 2018-11-13 DIAGNOSIS — M545 Low back pain, unspecified: Secondary | ICD-10-CM

## 2018-11-13 DIAGNOSIS — M9905 Segmental and somatic dysfunction of pelvic region: Secondary | ICD-10-CM | POA: Diagnosis not present

## 2018-11-13 DIAGNOSIS — Z01818 Encounter for other preprocedural examination: Secondary | ICD-10-CM

## 2018-11-13 DIAGNOSIS — M9903 Segmental and somatic dysfunction of lumbar region: Secondary | ICD-10-CM

## 2018-11-13 DIAGNOSIS — M9908 Segmental and somatic dysfunction of rib cage: Secondary | ICD-10-CM

## 2018-11-13 DIAGNOSIS — M9902 Segmental and somatic dysfunction of thoracic region: Secondary | ICD-10-CM | POA: Diagnosis not present

## 2018-11-13 DIAGNOSIS — M9904 Segmental and somatic dysfunction of sacral region: Secondary | ICD-10-CM

## 2018-11-13 LAB — CBC
HCT: 41 % (ref 36.0–46.0)
Hemoglobin: 13.5 g/dL (ref 12.0–15.0)
MCHC: 32.9 g/dL (ref 30.0–36.0)
MCV: 97.7 fl (ref 78.0–100.0)
Platelets: 374 10*3/uL (ref 150.0–400.0)
RBC: 4.2 Mil/uL (ref 3.87–5.11)
RDW: 12.5 % (ref 11.5–15.5)
WBC: 6.1 10*3/uL (ref 4.0–10.5)

## 2018-11-13 LAB — BASIC METABOLIC PANEL
BUN: 18 mg/dL (ref 6–23)
CO2: 25 mEq/L (ref 19–32)
Calcium: 9.4 mg/dL (ref 8.4–10.5)
Chloride: 107 mEq/L (ref 96–112)
Creatinine, Ser: 0.76 mg/dL (ref 0.40–1.20)
GFR: 86.59 mL/min (ref >60.00)
Glucose, Bld: 84 mg/dL (ref 70–99)
Potassium: 4.1 mEq/L (ref 3.5–5.1)
Sodium: 140 mEq/L (ref 135–145)

## 2018-11-13 NOTE — Progress Notes (Signed)
PROCEDURE NOTE : OSTEOPATHIC MANIPULATION The decision today to treat with Osteopathic Manipulative Therapy (OMT) was based on physical exam findings. Verbal consent was obtained following a discussion with the patient regarding the of risks, benefits and potential side effects, including an acute pain flare,post manipulation soreness and need for repeat treatments.     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 Patient was given medications, exercises, stretches and lifestyle modifications per AVS and verbally.

## 2018-11-13 NOTE — Progress Notes (Signed)
Carrie Richards. Carrie Richards Sports Medicine [facility] [phone]  Carrie Richards - 47 y.o. female MRN [id number]  Date of birth: [birth year]  Visit Date:   PCP: [name], DO   Referred by: [name], DO   SUBJECTIVE:  Chief Complaint  Patient presents with  . f/u LBP    Takes IBU and Flexeril prn. Has responded well to OMT in the past. Provided with [employer] vidoes link and HEP, doing daily. Worse with long periods of walking or sitting, contrete floors. Improves with rest, heat.     HPI: Patient is here for follow-up of her right low back pain.  She is having good improvement in her pain with home exercises, osteopathic ambulation and intermittent ibuprofen and Flexeril.  The pain is still considered sharp but is rated as mild to moderate left she has an episode of prolonged sitting, prolonged or overexertion where the pain is in and rated as severe.  She denies any swelling numbness or weakness.  Pain does radiate into down past the knee.  Worsened with walking for long periods of time.  Pain does radiates into the right upper leg but nothing past the knee.  She is content doing to use ibuprofen and Flexeril intermittently.  She is doing home exercises as well as Astronomer" with Dr. Myles Lipps with moderate improvement in her symptoms.  Osteopathic manipulation has been helpful.  REVIEW OF SYSTEMS: She continues to have some intermittent nighttime disturbances due to this pain but denies any other changes in bowel or bladder.  No recent falls headaches dizziness numbness or tingling.  No significant lower extremity swelling.  She is scheduled for an upcoming surgery, hysterectomy, in February.  HISTORY:  Prior history reviewed and updated per electronic medical record.  Social History   Occupational History  . Occupation: Music therapist: [employer]  Tobacco Use  . Smoking status: Never Smoker    . Smokeless tobacco: Never Used  Substance and Sexual Activity  . Alcohol use: Yes    Alcohol/week: 2.0 standard drinks    Types: 2 Standard drinks or equivalent per week  . Drug use: No  . Sexual activity: Yes    Partners: Male    Birth control/protection: Surgical   Social History   Social History Narrative  . Not on file      DATA OBTAINED & REVIEWED:  Recent Labs    07/18/18 0911  CALCIUM 9.7  AST 7  ALT 7   No problems updated. No specialty comments available.  OBJECTIVE:  VS:  HT:5\' 2"  (157.5 cm)   WT:140 lb 9.6 oz (63.8 kg)  BMI:25.71    BP:110/72  HR:80bpm  TEMP: ( )  RESP:96 %   PHYSICAL EXAM: Adult female.  No acute distress.  Alert and appropriate.  She has good internal and external rotation about it tight right hip flexors spasm she is able to heel and toe walk without difficulty.  Normal sensation in dermatomes.   ASSESSMENT   1. Acute right-sided low back pain without sciatica   2. Somatic dysfunction of thoracic region   3. Somatic dysfunction of lumbar region   4. Somatic dysfunction of pelvis region   5. Somatic dysfunction of rib cage region   6. Somatic dysfunction of sacral region      PLAN:  Pertinent additional documentation may be included in corresponding procedure notes, imaging studies, problem based documentation and patient instructions.  Procedures:  Osteopathic manipulation was performed today based on physical exam findings.  Please see procedure note for further information including Osteopathic Exam findings  Medications:  No orders of the defined types were placed in this encounter.  Discussion/Instructions: No problem-specific Assessment & Plan notes found for this encounter. THERAPEUTIC EXERCISE: Discussed the foundation of treatment for this condition is physical therapy and/or daily (5-6 days/week) therapeutic exercises, focusing on core strengthening, coordination, neuromuscular control/reeducation.  Continue  previously prescribed home exercise program.  Discussed red flag symptoms that warrant earlier emergent evaluation and patient voices understanding. Activity modifications and the importance of avoiding exacerbating activities (limiting pain to no more than a 4 / 10 during or following activity) recommended and discussed. Overall doing quite well.  Continue your pubic exercises as well as with mitten osteopathic evaluation.  We did discuss that she would likely have an exacerbation following her hysterectomy but the better she is going into the better she will come out.     At follow up will plan : to consider repeat osteopathic manipulation Return in about 4 weeks (around 12/11/2018) for consideration of repeat Osteopathic Manipulation.          Andrena Mews, DO     Sports Medicine Physician

## 2018-11-30 NOTE — Progress Notes (Signed)
ERROR

## 2018-12-01 ENCOUNTER — Encounter: Payer: Self-pay | Admitting: Family Medicine

## 2018-12-01 ENCOUNTER — Encounter: Payer: BLUE CROSS/BLUE SHIELD | Admitting: Family Medicine

## 2018-12-04 ENCOUNTER — Other Ambulatory Visit: Payer: Self-pay | Admitting: Family Medicine

## 2018-12-04 DIAGNOSIS — F5102 Adjustment insomnia: Secondary | ICD-10-CM

## 2018-12-09 NOTE — Telephone Encounter (Signed)
Last script 10/17/18 #30 no rf  Last app 10/17/18 No f/u

## 2018-12-12 ENCOUNTER — Ambulatory Visit: Payer: BLUE CROSS/BLUE SHIELD | Admitting: Sports Medicine

## 2019-01-31 IMAGING — US US PELVIS COMPLETE
1 series · 14 of 25 positions shown · non-contrast
Comparison: None in PACs

CLINICAL DATA: Vaginal discharge.  History of hysterectomy.



[Series 1: us pelvis complete · 0.25mm/px · 14 of 28 slices shown]
[im 1/28]
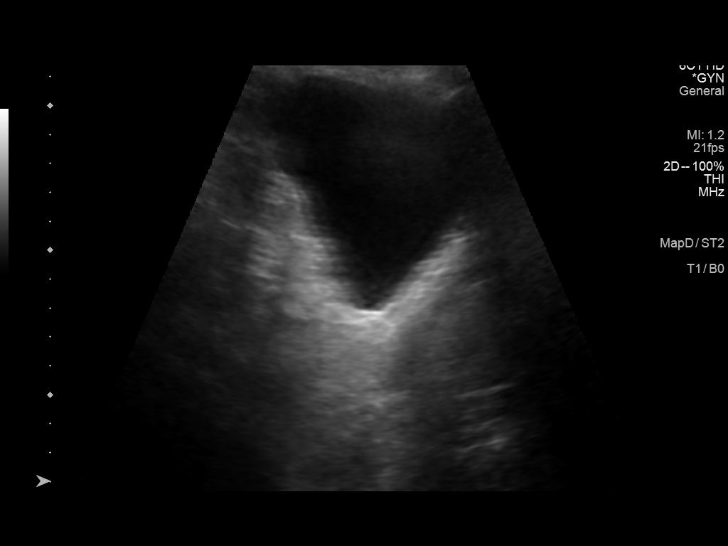
[im 3/28]
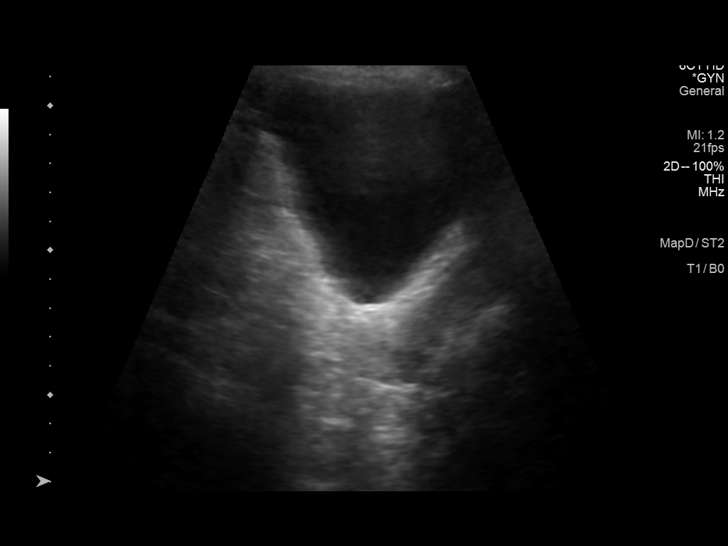
[im 5/28]
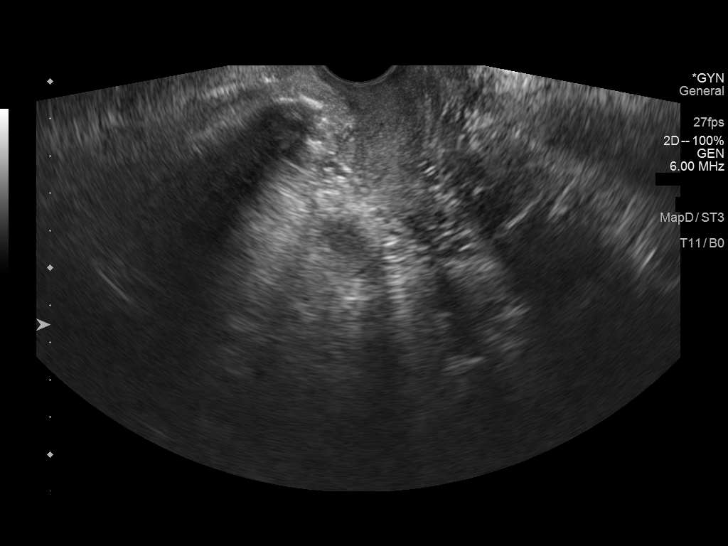
[im 7/28]
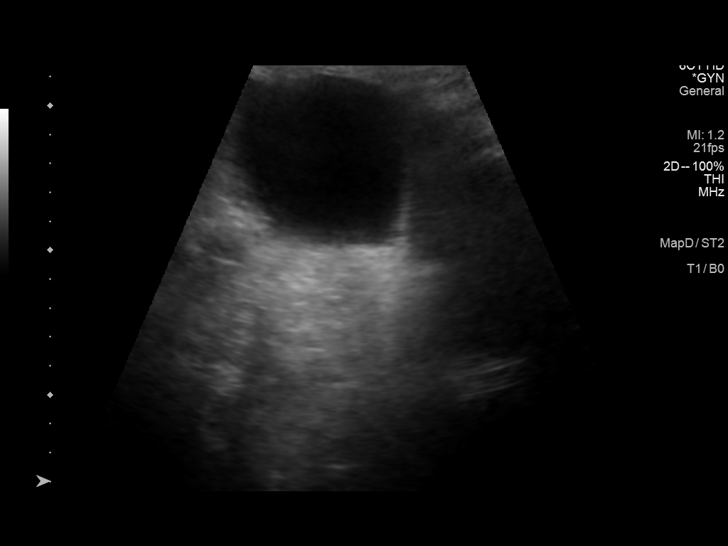
[im 10/28]
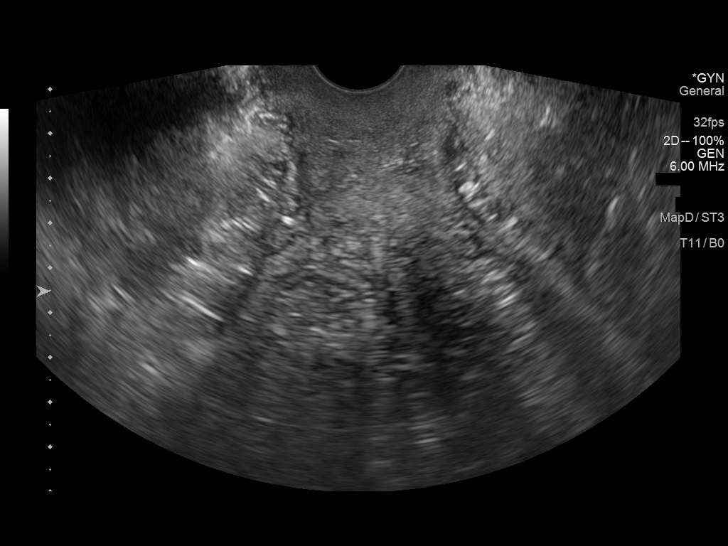
[im 11/28]
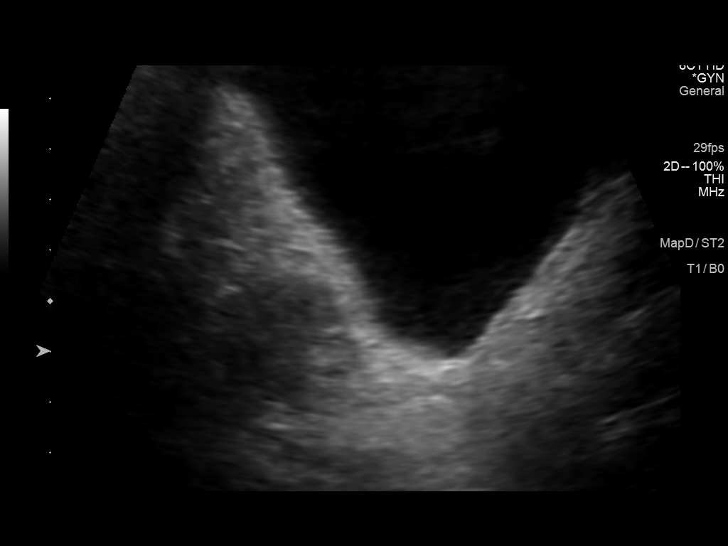
[im 13/28]
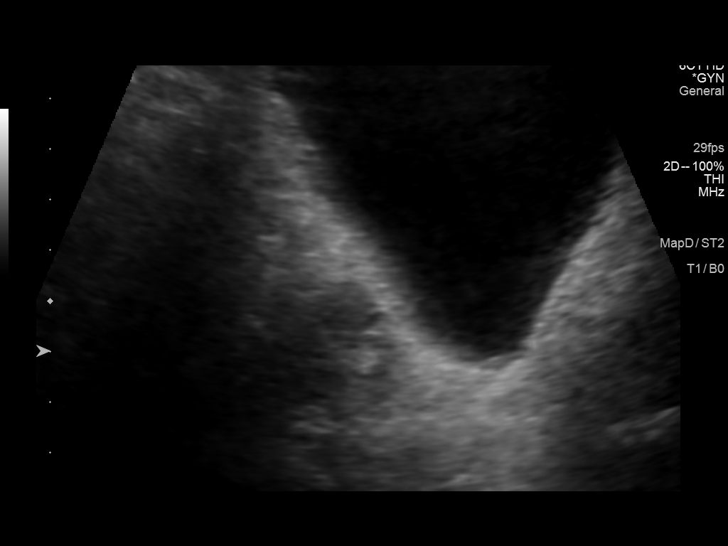
[im 15/28]
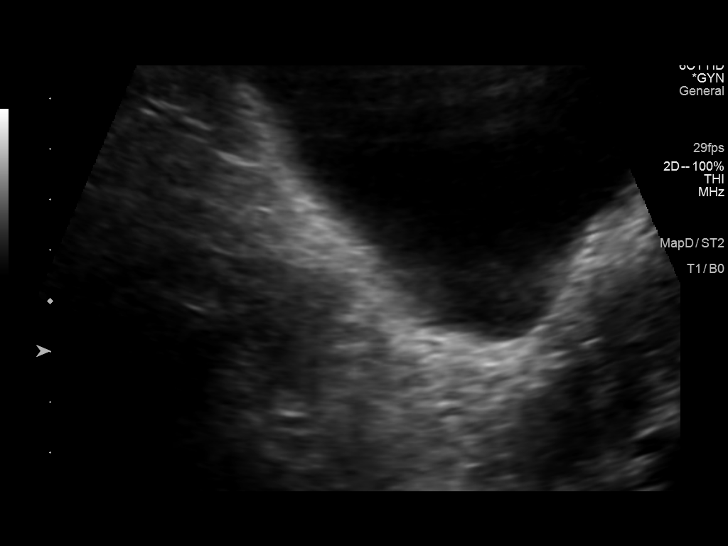
[im 17/28]
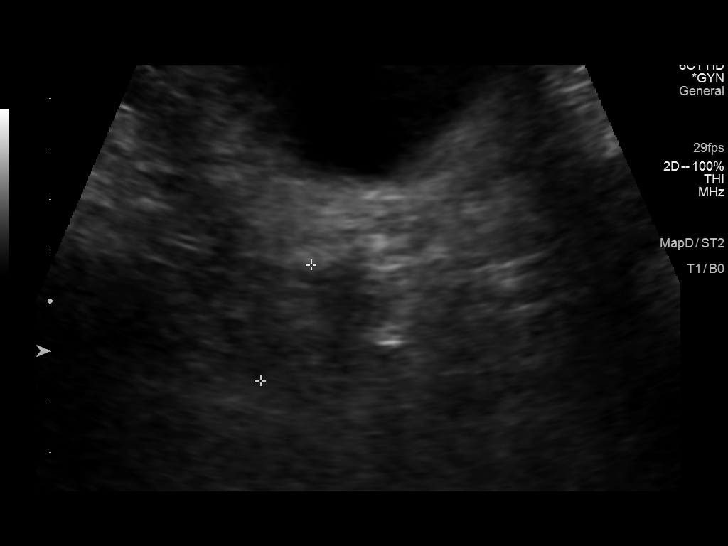
[im 19/28]
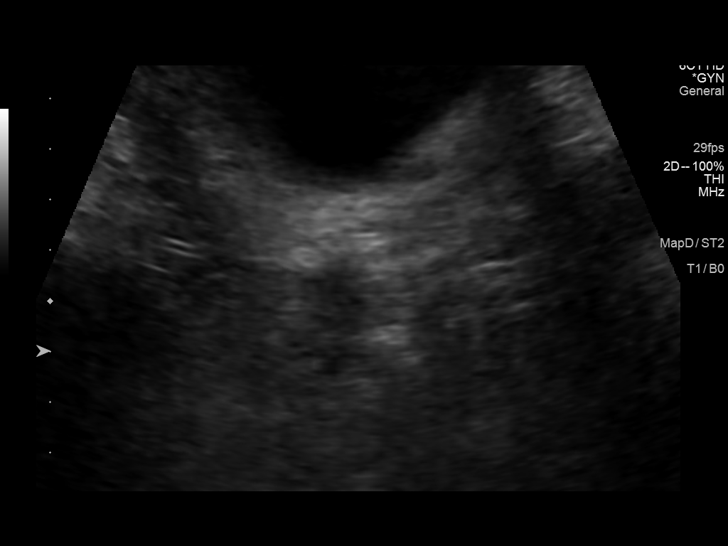
[im 21/28]
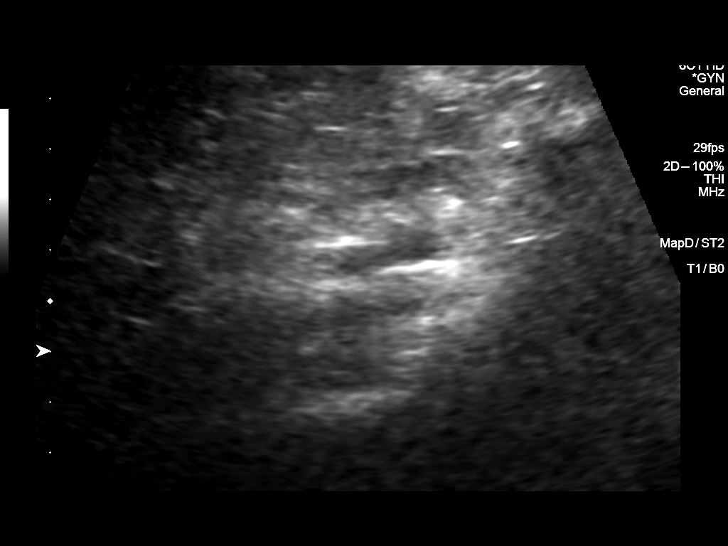
[im 23/28]
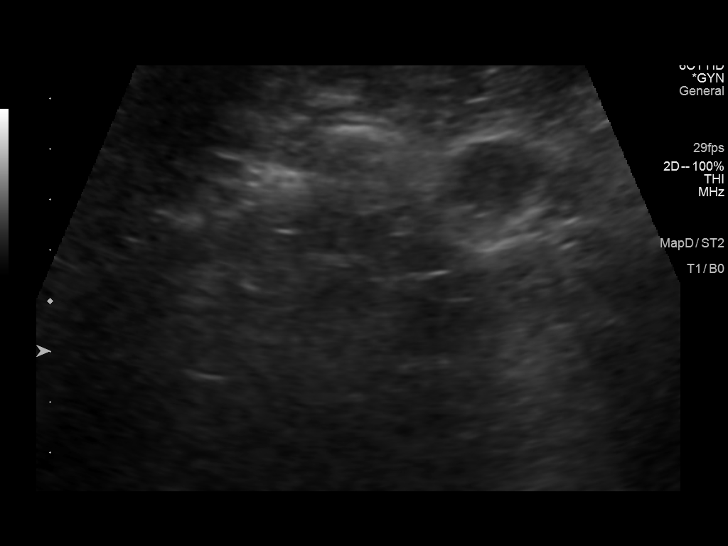
[im 25/28]
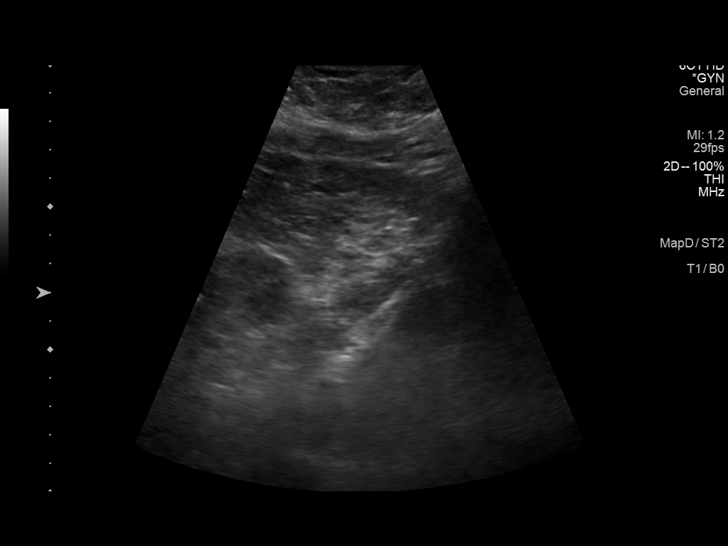
[im 28/28]
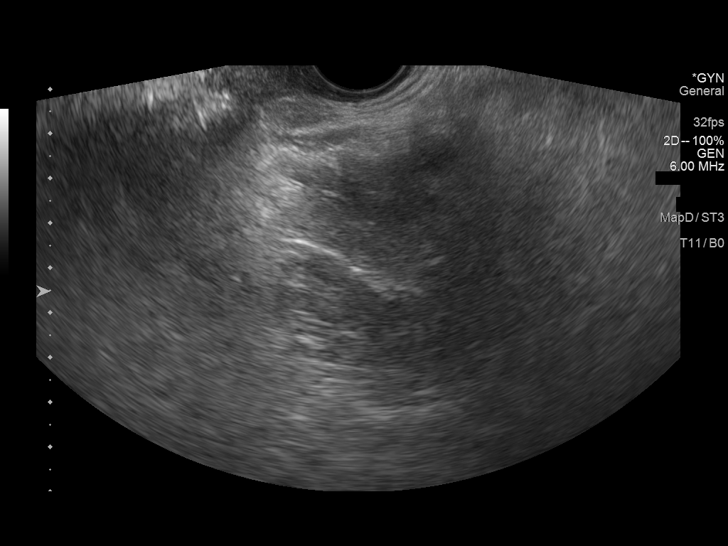

[14 of 25 positions shown; findings below may reference images not displayed]

FINDINGS: Uterus

The uterus is surgically absent. The vaginal cuff is not visualized.

Right ovary

Measurements: 3.2 x 2.5 x 2.5 cm.. Normal appearance/no adnexal
mass.

Left ovary

The left ovary could not be visualized.

Other findings

No abnormal free fluid.
IMPRESSION: Previous hysterectomy. The vaginal cuff was not visualized on
today's study.

Normal appearance of the right ovary. The left ovary was not
visualized. No adnexal masses were observed.

## 2019-02-27 ENCOUNTER — Other Ambulatory Visit: Payer: Self-pay

## 2019-02-27 NOTE — Telephone Encounter (Signed)
OV 11/13/2018 Last refill 09/25/2018 #30/1 Next OV not scheduled

## 2019-03-02 MED ORDER — CYCLOBENZAPRINE HCL 10 MG PO TABS: 10.0000 mg | ORAL_TABLET | Freq: Three times a day (TID) | ORAL | 1 refills | Status: DC | PRN

## 2019-03-13 ENCOUNTER — Encounter: Payer: Self-pay | Admitting: Family Medicine

## 2019-03-15 NOTE — Progress Notes (Signed)
Virtual Visit via Video   I connected with Carrie Richards on 03/16/19 at  8:40 AM EDT by a video enabled telemedicine application and verified that I am speaking with the correct person using two identifiers. Location patient: Home Location provider: [employer], Office Persons participating in the virtual visit: Evanna, [place], DO Lonell Grandchild, CMA acting as scribe for Dr. Briscoe Deutscher.   I discussed the limitations of evaluation and management by telemedicine and the availability of in person appointments. The patient expressed understanding and agreed to proceed.  Subjective:   Carrie Richards is moving out of state for job with [employer]. She will be moving back with company in the next 3 months to one year. She would like to change some medications to make the move better for her. She has put off surgery until she is back in the area. We have reviewed all medications and discussed changes. She is concerned about running out of medications and where anything is at. The only things she is ready to pick up.   Wt Readings from Last 3 Encounters:  12/01/18 142 lb 9.6 oz (64.7 kg)  11/13/18 140 lb 9.6 oz (63.8 kg)  11/10/18 139 lb 9.6 oz (63.3 kg)   BP Readings from Last 3 Encounters:  12/01/18 110/76  11/13/18 110/72  11/10/18 100/78   ROS: See pertinent positives and negatives per HPI.  Patient Active Problem List   Diagnosis Date Noted  . 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 Vaginal discharge 06/16/2017  . 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 GERD (gastroesophageal reflux disease) 06/16/2017  . BMI 33.0-33.9,adult 11/20/2013    Social History   Tobacco Use  . Smoking status: Never Smoker  . Smokeless tobacco: Never Used  Substance Use Topics  . Alcohol use: Yes    Alcohol/week: 2.0 standard drinks    Types: 2 Standard drinks or equivalent per week   Current Outpatient Medications:  .  buPROPion (WELLBUTRIN XL) 150 MG 24 hr tablet, Take 1 tablet (150 mg total)  by mouth daily., Disp: 90 tablet, Rfl: 3 .  cyclobenzaprine (FLEXERIL) 10 MG tablet, Take 1 tablet (10 mg total) by mouth 3 (three) times daily as needed for muscle spasms., Disp: 30 tablet, Rfl: 1 .  LORazepam (ATIVAN) 1 MG tablet, TAKE 1/2 TO 1 TABLET BY MOUTH AT BEDTIME AS NEEDED FOR SLEEP, Disp: 30 tablet, Rfl: 4 .  nitrofurantoin, macrocrystal-monohydrate, (MACROBID) 100 MG capsule, Take 1 capsule (100 mg total) by mouth daily., Disp: 90 capsule, Rfl: 3 .  phentermine (ADIPEX-P) 37.5 MG tablet, Take 1 tablet (37.5 mg total) by mouth daily before breakfast., Disp: 90 tablet, Rfl: 0 .  topiramate (TOPAMAX) 50 MG tablet, Take 1 tablet (50 mg total) by mouth 2 (two) times daily., Disp: 60 tablet, Rfl: 3  No Known Allergies  Objective:   VITALS: Per patient if applicable, see vitals. GENERAL: Alert, appears well and in no acute distress. HEENT: Atraumatic, conjunctiva clear, no obvious abnormalities on inspection of external nose and ears. NECK: Normal movements of the head and neck. CARDIOPULMONARY: No increased WOB. Speaking in clear sentences. I:E ratio WNL.  MS: Moves all visible extremities without noticeable abnormality. PSYCH: Pleasant and cooperative, well-groomed. Speech normal rate and rhythm. Affect is appropriate. Insight and judgement are appropriate. Attention is focused, linear, and appropriate.  NEURO: CN grossly intact. Oriented as arrived to appointment on time with no prompting. Moves both UE equally.  SKIN: No obvious lesions, wounds, erythema, or cyanosis noted on face  or hands.  Assessment and Plan:   Chessa was seen today for follow-up.  Diagnoses and all orders for this visit:  Reactive depression Comments: Controlled. Continue current treatment.  Orders: -     buPROPion (WELLBUTRIN XL) 150 MG 24 hr tablet; Take 1 tablet (150 mg total) by mouth daily.  Obesity (BMI 30-39.9) Comments: Reviewed healthy eating patterns and stress reduction.  Orders: -      Discontinue: phentermine (ADIPEX-P) 37.5 MG tablet; Take 1 tablet (37.5 mg total) by mouth daily before breakfast. -     phentermine (ADIPEX-P) 37.5 MG tablet; Take 1 tablet (37.5 mg total) by mouth daily before breakfast.  Adjustment insomnia Comments: Continue current treatment.    . Reviewed expectations re: course of current medical issues. . Discussed self-management of symptoms. . Outlined signs and symptoms indicating need for more acute intervention. . Patient verbalized understanding and all questions were answered. Marland Kitchen Health Maintenance issues including appropriate healthy diet, exercise, and smoking avoidance were discussed with patient. . See orders for this visit as documented in the electronic medical record.  Briscoe Deutscher, DO 03/16/2019   Records requested if needed. Time spent: 25 minutes, of which >50% was spent in obtaining information about her symptoms, reviewing her previous labs, evaluations, and treatments, counseling her about her condition (please see the discussed topics above), and developing a plan to further investigate it; she had a number of questions which I addressed.

## 2019-03-16 ENCOUNTER — Ambulatory Visit (INDEPENDENT_AMBULATORY_CARE_PROVIDER_SITE_OTHER): Payer: BLUE CROSS/BLUE SHIELD | Admitting: Family Medicine

## 2019-03-16 ENCOUNTER — Other Ambulatory Visit: Payer: Self-pay

## 2019-03-16 ENCOUNTER — Encounter: Payer: Self-pay | Admitting: Family Medicine

## 2019-03-16 DIAGNOSIS — F329 Major depressive disorder, single episode, unspecified: Secondary | ICD-10-CM | POA: Diagnosis not present

## 2019-03-16 DIAGNOSIS — E669 Obesity, unspecified: Secondary | ICD-10-CM | POA: Diagnosis not present

## 2019-03-16 DIAGNOSIS — F5102 Adjustment insomnia: Secondary | ICD-10-CM | POA: Diagnosis not present

## 2019-03-16 MED ORDER — PHENTERMINE HCL 37.5 MG PO TABS: 37.5000 mg | ORAL_TABLET | Freq: Every day | ORAL | 0 refills | Status: DC

## 2019-03-16 MED ORDER — BUPROPION HCL ER (XL) 150 MG PO TB24: 150.0000 mg | ORAL_TABLET | Freq: Every day | ORAL | 3 refills | Status: DC

## 2019-03-17 ENCOUNTER — Telehealth: Payer: Self-pay

## 2019-03-17 NOTE — Telephone Encounter (Signed)
Carrie Richards [facility] - PA Case ID: [id number] - Rx #: [id number] Need help? Call us at [phone]  Status  Sent to [employer]  DrugPhentermine HCl 37.5MG  tablets  [facility] Electronic PA Form (NCPDP)  Original [place] REQ-MD CALL [phone].DRUG REQUIRES PRIOR AUTHORIZATION

## 2019-03-17 NOTE — Telephone Encounter (Signed)
Carrie Richards (Key: [id number])   This request has received a N/A outcome. Please note any additional information provided by [employer] at the bottom of this request.

## 2019-03-30 NOTE — Telephone Encounter (Signed)
She can do self pay.

## 2019-03-31 NOTE — Telephone Encounter (Signed)
Called pt and left VM to call the office. Sent MyChart message with info for goodrx. Price is $15.30.    Member ID [id number] [facility] [place] [id number] [place]

## 2019-04-03 ENCOUNTER — Telehealth: Payer: Self-pay | Admitting: Family Medicine

## 2019-04-03 NOTE — Telephone Encounter (Signed)
Copied from [place] [phone]. Topic: General - Other >> Apr 03, 2019  1:40 PM Mcneil, Ja-Kwan wrote: Reason for CRM: Pt husband requests a letter for a life insurance policy stating that pt is healthy. Pt husband asked that the letter be put on her [facility] account so she can just print it.

## 2019-04-05 ENCOUNTER — Other Ambulatory Visit: Payer: Self-pay | Admitting: Family Medicine

## 2019-04-05 DIAGNOSIS — E669 Obesity, unspecified: Secondary | ICD-10-CM

## 2019-04-06 NOTE — Telephone Encounter (Signed)
Last OV 03/16/19 Last refill 10/07/18 #60/3 Next OV not scheduled

## 2019-04-07 NOTE — Telephone Encounter (Signed)
Called pt and left VM to call the office.  

## 2019-04-07 NOTE — Telephone Encounter (Signed)
Sent MyChart message requesting more information.

## 2019-04-07 NOTE — Telephone Encounter (Signed)
General statement of health letter pending, please review.

## 2019-06-23 ENCOUNTER — Other Ambulatory Visit: Payer: Self-pay | Admitting: Family Medicine

## 2019-06-23 ENCOUNTER — Other Ambulatory Visit: Payer: Self-pay | Admitting: Sports Medicine

## 2019-06-23 DIAGNOSIS — E669 Obesity, unspecified: Secondary | ICD-10-CM

## 2019-06-24 NOTE — Telephone Encounter (Signed)
Called and LM regarding whether or not the pt needs refill of Flexeril.  Asked pt to either message [place] via MyChart or call the office to indicate whether she does or does not need this refill.

## 2019-07-01 ENCOUNTER — Ambulatory Visit (INDEPENDENT_AMBULATORY_CARE_PROVIDER_SITE_OTHER): Payer: BC Managed Care – PPO

## 2019-07-01 ENCOUNTER — Encounter: Payer: Self-pay | Admitting: Family Medicine

## 2019-07-01 ENCOUNTER — Ambulatory Visit (INDEPENDENT_AMBULATORY_CARE_PROVIDER_SITE_OTHER): Payer: BC Managed Care – PPO | Admitting: Family Medicine

## 2019-07-01 ENCOUNTER — Other Ambulatory Visit: Payer: Self-pay

## 2019-07-01 VITALS — BP 120/80 | HR 94 | Temp 98.0°F | Ht 62.0 in | Wt 150.6 lb

## 2019-07-01 DIAGNOSIS — Z13220 Encounter for screening for lipoid disorders: Secondary | ICD-10-CM | POA: Diagnosis not present

## 2019-07-01 DIAGNOSIS — Z0000 Encounter for general adult medical examination without abnormal findings: Secondary | ICD-10-CM

## 2019-07-01 DIAGNOSIS — M5441 Lumbago with sciatica, right side: Secondary | ICD-10-CM | POA: Diagnosis not present

## 2019-07-01 DIAGNOSIS — E663 Overweight: Secondary | ICD-10-CM

## 2019-07-01 DIAGNOSIS — E559 Vitamin D deficiency, unspecified: Secondary | ICD-10-CM

## 2019-07-01 DIAGNOSIS — G8929 Other chronic pain: Secondary | ICD-10-CM

## 2019-07-01 DIAGNOSIS — F329 Major depressive disorder, single episode, unspecified: Secondary | ICD-10-CM | POA: Diagnosis not present

## 2019-07-01 DIAGNOSIS — H9202 Otalgia, left ear: Secondary | ICD-10-CM

## 2019-07-01 DIAGNOSIS — M5442 Lumbago with sciatica, left side: Secondary | ICD-10-CM

## 2019-07-01 DIAGNOSIS — F5102 Adjustment insomnia: Secondary | ICD-10-CM | POA: Diagnosis not present

## 2019-07-01 DIAGNOSIS — K760 Fatty (change of) liver, not elsewhere classified: Secondary | ICD-10-CM

## 2019-07-01 DIAGNOSIS — M5136 Other intervertebral disc degeneration, lumbar region: Secondary | ICD-10-CM | POA: Diagnosis not present

## 2019-07-01 LAB — CBC WITH DIFFERENTIAL/PLATELET
Basophils Absolute: 0 10*3/uL (ref 0.0–0.1)
Basophils Relative: 0.5 % (ref 0.0–3.0)
Eosinophils Absolute: 0.1 10*3/uL (ref 0.0–0.7)
Eosinophils Relative: 1.1 % (ref 0.0–5.0)
HCT: 40.7 % (ref 36.0–46.0)
Hemoglobin: 13.7 g/dL (ref 12.0–15.0)
Lymphocytes Relative: 32.1 % (ref 12.0–46.0)
Lymphs Abs: 2.3 10*3/uL (ref 0.7–4.0)
MCHC: 33.6 g/dL (ref 30.0–36.0)
MCV: 93.8 fl (ref 78.0–100.0)
Monocytes Absolute: 0.4 10*3/uL (ref 0.1–1.0)
Monocytes Relative: 6.1 % (ref 3.0–12.0)
Neutro Abs: 4.4 10*3/uL (ref 1.4–7.7)
Neutrophils Relative %: 60.2 % (ref 43.0–77.0)
Platelets: 355 10*3/uL (ref 150.0–400.0)
RBC: 4.34 Mil/uL (ref 3.87–5.11)
RDW: 12.8 % (ref 11.5–15.5)
WBC: 7.3 10*3/uL (ref 4.0–10.5)

## 2019-07-01 LAB — COMPREHENSIVE METABOLIC PANEL
ALT: 7 U/L (ref 0–35)
AST: 10 U/L (ref 0–37)
Albumin: 4.7 g/dL (ref 3.5–5.2)
Alkaline Phosphatase: 68 U/L (ref 39–117)
BUN: 16 mg/dL (ref 6–23)
CO2: 25 mEq/L (ref 19–32)
Calcium: 9.6 mg/dL (ref 8.4–10.5)
Chloride: 106 mEq/L (ref 96–112)
Creatinine, Ser: 0.89 mg/dL (ref 0.40–1.20)
GFR: 67.72 mL/min (ref >60.00)
Glucose, Bld: 84 mg/dL (ref 70–99)
Potassium: 4 mEq/L (ref 3.5–5.1)
Sodium: 138 mEq/L (ref 135–145)
Total Bilirubin: 0.7 mg/dL (ref 0.2–1.2)
Total Protein: 6.6 g/dL (ref 6.0–8.3)

## 2019-07-01 LAB — LIPID PANEL
Cholesterol: 196 mg/dL (ref 0–200)
HDL: 36.3 mg/dL — ABNORMAL LOW (ref >39.00)
LDL Cholesterol: 126 mg/dL — ABNORMAL HIGH (ref 0–99)
NonHDL: 159.41
Total CHOL/HDL Ratio: 5
Triglycerides: 169 mg/dL — ABNORMAL HIGH (ref 0.0–149.0)
VLDL: 33.8 mg/dL (ref 0.0–40.0)

## 2019-07-01 LAB — VITAMIN D 25 HYDROXY (VIT D DEFICIENCY, FRACTURES): VITD: 30.36 ng/mL (ref 30.00–100.00)

## 2019-07-01 LAB — TSH: TSH: 1.45 u[IU]/mL (ref 0.35–4.50)

## 2019-07-01 MED ORDER — OFLOXACIN 0.3 % OT SOLN: 5.0000 [drp] | Freq: Every day | OTIC | 0 refills | Status: DC

## 2019-07-01 MED ORDER — CYCLOBENZAPRINE HCL 10 MG PO TABS: 10.0000 mg | ORAL_TABLET | Freq: Three times a day (TID) | ORAL | 1 refills | Status: DC | PRN

## 2019-07-01 NOTE — Progress Notes (Signed)
Subjective:    Carrie Richards is a 48 y.o. female and is here for a comprehensive physical exam.  Overall doing well.  Patient took a job in [place] late last year but was not happy with it or the locations of moved back home.  She is happy to be back with her family.  She is now working in [place] at [employer].  Due to previous untrue allegations against her husband by her niece, there has been a break with the extended family.  The patient states that her parents and sister as well as niece started contacting her again and this is causing her a lot of anxiety.  Her husband is actually a therapist.  He and the patient communicating well.  She has gained 5 to 7 pounds since her last visit.  She states that she was unable to exercise very much since getting home and she started drinking sodas.  She complains of low back pain with radiation to her right leg.  She also complains of some left leg numbness.  No falls.  No foot drop.  No saddle anesthesia or incontinence.  This is been going on for months to years but seems to be worsening.  Not really taking anything for the pain.  There are no preventive care reminders to display for this patient.   Current Outpatient Medications:  .  buPROPion (WELLBUTRIN XL) 150 MG 24 hr tablet, Take 1 tablet (150 mg total) by mouth daily., Disp: 90 tablet, Rfl: 3 .  cyclobenzaprine (FLEXERIL) 10 MG tablet, Take 1 tablet (10 mg total) by mouth 3 (three) times daily as needed for muscle spasms., Disp: 30 tablet, Rfl: 1 .  LORazepam (ATIVAN) 1 MG tablet, TAKE 1/2 TO 1 TABLET BY MOUTH AT BEDTIME AS NEEDED FOR SLEEP, Disp: 30 tablet, Rfl: 4 .  phentermine (ADIPEX-P) 37.5 MG tablet, Take 1 tablet (37.5 mg total) by mouth daily before breakfast., Disp: 90 tablet, Rfl: 0 .  topiramate (TOPAMAX) 50 MG tablet, TAKE 1 TABLET BY MOUTH TWICE A DAY, Disp: 180 tablet, Rfl: 0  PMHx, SurgHx, SocialHx, Medications, and Allergies were reviewed in the Visit Navigator and updated as appropriate.    Past Medical History:  Diagnosis Date  . Anemia   . Anxiety   . Reactive depression 06/16/2017     Past Surgical History:  Procedure Laterality Date  . ABDOMINAL HYSTERECTOMY    . CESAREAN SECTION    . CHOLECYSTECTOMY    . WRIST SURGERY Right      Family History  Problem Relation Age of Onset  . Diabetes Mother   . Cancer Mother   . Breast cancer Mother   . Ovarian cancer Mother   . Melanoma Father   . Hypertension Father   . Hyperlipidemia Father   . Hypertension Sister   . Depression Sister   . Hypertension Sister   . Heart disease Sister   . Depression Sister   . Heart attack Maternal Grandmother   . Breast cancer Paternal Grandmother     Social History   Tobacco Use  . Smoking status: Never Smoker  . Smokeless tobacco: Never Used  Substance Use Topics  . Alcohol use: Yes    Alcohol/week: 2.0 standard drinks    Types: 2 Standard drinks or equivalent per week  . Drug use: No    Review of Systems:   Pertinent items are noted in the HPI. Otherwise, ROS is negative.  Objective:   BP 120/80 (BP Location: Left Arm,  Patient Position: Sitting, Cuff Size: Normal)   Pulse 94   Temp 98 F (36.7 C) (Temporal)   Ht 5\' 2"  (1.575 m)   Wt 150 lb 9.6 oz (68.3 kg)   LMP  (LMP Unknown)   SpO2 98%   BMI 27.55 kg/m   General appearance: alert, cooperative and appears stated age. Head: normocephalic, without obvious abnormality, atraumatic. Neck: no adenopathy, supple, symmetrical, trachea midline; thyroid not enlarged, symmetric, no tenderness/mass/nodules. Lungs: clear to auscultation bilaterally. Heart: regular rate and rhythm Abdomen: soft, non-tender; no masses,  no organomegaly. Extremities: extremities normal, atraumatic, no cyanosis or edema. Skin: skin color, texture, turgor normal, no rashes or lesions. Lymph: cervical, supraclavicular, and axillary nodes normal; no abnormal inguinal nodes palpated. Neurologic: grossly normal. MSK: Tender palpation  along the lumbar paraspinal musculature with decreased lordosis, mildly positive straight leg raising right.  Assessment/Plan:   Carrie Richards was seen today for annual exam.  Diagnoses and all orders for this visit:  Routine physical examination  Adjustment insomnia Comments: May continue current as needed regimen. Orders: -     CBC with Differential/Platelet -     Comprehensive metabolic panel -     TSH  Reactive depression Comments: Doing well.  We reviewed the importance of boundaries with her family.  Screening for lipid disorders -     Lipid panel  Vitamin D deficiency -     VITAMIN D 25 Hydroxy (Vit-D Deficiency, Fractures)  Chronic right-sided low back pain with bilateral sciatica Comments: X-ray today.  Will consider PMR. Orders: -     DG Lumbar Spine Complete; Future -     cyclobenzaprine (FLEXERIL) 10 MG tablet; Take 1 tablet (10 mg total) by mouth 3 (three) times daily as needed for muscle spasms. -     Ambulatory referral to Sports Medicine  Otalgia, left -     Discontinue: ofloxacin (FLOXIN OTIC) 0.3 % OTIC solution; Place 5 drops into the left ear daily.  Hepatic steatosis, CT abdomen Comments: Will check liver function and lipid panel today.  Overweight (BMI 25.0-29.9) Comments: We reviewed the importance of stopping no sodas.  Patient has worked very hard at the past couple of years on weight loss.  Continue Qsymia.    Patient Counseling: [x]    Nutrition: Stressed importance of moderation in sodium/caffeine intake, saturated fat and cholesterol, caloric balance, sufficient intake of fresh fruits, vegetables, fiber, calcium, iron, and 1 mg of folate supplement per day (for females capable of pregnancy).  [x]    Stressed the importance of regular exercise.   [x]    Substance Abuse: Discussed cessation/primary prevention of tobacco, alcohol, or other drug use; driving or other dangerous activities under the influence; availability of treatment for abuse.   [x]     Injury prevention: Discussed safety belts, safety helmets, smoke detector, smoking near bedding or upholstery.   [x]    Sexuality: Discussed sexually transmitted diseases, partner selection, use of condoms, avoidance of unintended pregnancy  and contraceptive alternatives.  [x]    Dental health: Discussed importance of regular tooth brushing, flossing, and dental visits.  [x]    Health maintenance and immunizations reviewed. Please refer to Health maintenance section.   Briscoe Deutscher, DO [place]

## 2019-07-02 ENCOUNTER — Other Ambulatory Visit: Payer: Self-pay

## 2019-07-02 DIAGNOSIS — M5136 Other intervertebral disc degeneration, lumbar region: Secondary | ICD-10-CM

## 2019-07-04 ENCOUNTER — Encounter: Payer: Self-pay | Admitting: Family Medicine

## 2019-07-04 DIAGNOSIS — K760 Fatty (change of) liver, not elsewhere classified: Secondary | ICD-10-CM | POA: Insufficient documentation

## 2019-07-04 DIAGNOSIS — E663 Overweight: Secondary | ICD-10-CM | POA: Insufficient documentation

## 2019-07-04 DIAGNOSIS — G8929 Other chronic pain: Secondary | ICD-10-CM | POA: Insufficient documentation

## 2019-07-08 ENCOUNTER — Other Ambulatory Visit: Payer: Self-pay

## 2019-07-08 DIAGNOSIS — F5102 Adjustment insomnia: Secondary | ICD-10-CM

## 2019-07-08 MED ORDER — LORAZEPAM 1 MG PO TABS: 0.5000 mg | ORAL_TABLET | Freq: Every evening | ORAL | 4 refills | Status: DC | PRN

## 2019-07-08 NOTE — Telephone Encounter (Signed)
Fax received for new script old one expired ok to send?

## 2019-07-15 ENCOUNTER — Other Ambulatory Visit: Payer: Self-pay

## 2019-07-15 ENCOUNTER — Ambulatory Visit (INDEPENDENT_AMBULATORY_CARE_PROVIDER_SITE_OTHER): Payer: BC Managed Care – PPO | Admitting: Physical Medicine and Rehabilitation

## 2019-07-15 ENCOUNTER — Encounter: Payer: Self-pay | Admitting: Physical Medicine and Rehabilitation

## 2019-07-15 VITALS — BP 117/70 | HR 86

## 2019-07-15 DIAGNOSIS — E663 Overweight: Secondary | ICD-10-CM | POA: Diagnosis not present

## 2019-07-15 DIAGNOSIS — M7918 Myalgia, other site: Secondary | ICD-10-CM | POA: Diagnosis not present

## 2019-07-15 DIAGNOSIS — M545 Low back pain: Secondary | ICD-10-CM

## 2019-07-15 DIAGNOSIS — M47816 Spondylosis without myelopathy or radiculopathy, lumbar region: Secondary | ICD-10-CM

## 2019-07-15 DIAGNOSIS — G8929 Other chronic pain: Secondary | ICD-10-CM

## 2019-07-15 NOTE — Progress Notes (Signed)
  Numeric Pain Rating Scale and Functional Assessment Average Pain 8 Pain Right Now 2 My pain is intermittent, burning and dull Pain is worse with: walking Pain improves with: rest   In the last MONTH (on 0-10 scale) has pain interfered with the following?  1. General activity like being  able to carry out your everyday physical activities such as walking, climbing stairs, carrying groceries, or moving a chair?  Rating(6)  2. Relation with others like being able to carry out your usual social activities and roles such as  activities at home, at work and in your community. Rating(6)  3. Enjoyment of life such that you have  been bothered by emotional problems such as feeling anxious, depressed or irritable?  Rating(10)

## 2019-07-16 ENCOUNTER — Telehealth: Payer: Self-pay

## 2019-07-16 NOTE — Telephone Encounter (Signed)
I called and left the patient a voicemail to call the office and ask for me so I can try to assist with getting this resolved for her.

## 2019-07-16 NOTE — Telephone Encounter (Signed)
Do you know anything about this? 

## 2019-07-16 NOTE — Telephone Encounter (Signed)
No I do not but I don't mind looking into it for the patient. I will call her.

## 2019-07-16 NOTE — Telephone Encounter (Signed)
Copied from [place] [phone]. Topic: General - Inquiry >> Jul 16, 2019 11:07 AM Carrie Richards wrote: Reason for CRM: pt states she conversed with someone at the office through [facility] concerning charges from 08/26/18-11/25/18. Pt had cobra insurance at the time and nothing was paid.  Pt states today she got a letter stating had been turned over to collections.  Pt states she is upset, and is coming to the office this afternoon to get this resolved.  She does not know the cobra ID-she said would have been in those [employer], which have been going directly into the office.

## 2019-07-21 ENCOUNTER — Telehealth: Payer: BC Managed Care – PPO | Admitting: Family Medicine

## 2019-07-23 ENCOUNTER — Ambulatory Visit: Payer: BC Managed Care – PPO | Admitting: Physical Medicine and Rehabilitation

## 2019-07-31 NOTE — Telephone Encounter (Signed)
Patient's husband Mr. Cothran came into the office today regarding the bill that the patient received due to the [employer] incident and spoke with [facility], front office representative. [place], front office rep that was sitting alongside Madelyn came to get me to verify what the real time eligibility was saying which was that the ID # [id number] that was the [employer] insurance per the patient's husband stated that the insurance started in May 2020.   I printed for him what we were receiving from the [facility] and encouraged him to contact the insurance company because this is the information they were providing.  Mr. Oni said that he would contact insurance and I provided my business card for him to reach out so the next step that I could do was email charge corrections to resubmit the claim.    No need to route note, for documentation purposes only. Awaiting a call from mr. Bencosme.

## 2019-08-14 ENCOUNTER — Encounter: Payer: Self-pay | Admitting: Family Medicine

## 2019-08-19 ENCOUNTER — Other Ambulatory Visit: Payer: Self-pay | Admitting: Family Medicine

## 2019-08-19 DIAGNOSIS — N390 Urinary tract infection, site not specified: Secondary | ICD-10-CM

## 2019-08-20 ENCOUNTER — Other Ambulatory Visit: Payer: Self-pay | Admitting: Family Medicine

## 2019-08-20 DIAGNOSIS — E669 Obesity, unspecified: Secondary | ICD-10-CM

## 2019-08-20 NOTE — Telephone Encounter (Signed)
Last fill 03/16/19  #90/0 Last OV 07/01/19

## 2019-08-27 ENCOUNTER — Encounter: Payer: Self-pay | Admitting: Physical Medicine and Rehabilitation

## 2019-08-27 NOTE — Progress Notes (Signed)
Carrie Richards - 48 y.o. female MRN [id number]  Date of birth: Oct 03, 1971  Office Visit Note: Visit Date: 07/15/2019 PCP: [name], DO Referred by: [name], DO  Subjective: Chief Complaint  Patient presents with   Lower Back - Pain   HPI: Carrie Richards is a 48 y.o. female who comes in today At the request of Dr. [name] for evaluation management of low back pain bilateral hip and thigh pain.  It appears that she was seeing Dr. Gaspar Bidding which appears evidently started in October of last year with noted acute right-sided lower back pain without much in the way of sciatica.  This is per his notes.  The patient somewhat of a difficult historian.  It appears that he reviewed CT scan that she had of her abdomen and pelvis from 2018.  I reviewed this as well.  She has very minimal changes in the lumbar spine with a transitional segment.  He treated her with osteopathic manipulation which she said helped her a great deal but was temporary.  At least on the first flared at least on the first flareup of problems he did treat her with Medrol Dosepak and cyclobenzaprine.  He continued treatment with osteopathic manipulation.  She reports that she thought he was treating her for a "dislocated hip ".  I see no evidence anywhere of hip dislocation or specific trauma.  Her complaints today are pain and soreness in both legs.  She has bilateral somewhat lateral thigh pain without numbness or tingling.  She has difficulty sleeping at night due to leg pain.  Plain film x-rays were obtained as well from Dr. Earlene Plater and those are reviewed below.  She denies any weakness in the feet or foot drop.  She has not had any focal night pain or unintended weight loss or other red flag complaints.  She has had no bowel or bladder changes.  She rates her pain as an 8 out of 10 on average but right now is been a 2 out of 10.  It is intermittent and burning and dull.  Is worse with walking and better at rest.   She has had no prior lumbar surgery.  She continues with home exercises and trying to stay active.  This does seem to limit some of her activities of daily living.  Her case is complicated with some depression anxiety and obesity.  She is currently taking topiramate and phentermine.  Dr. Earlene Plater and also had a referral to Dr. Lavada Mesi in our practice but that is been canceled evidently because she was seeing me first.  Review of Systems  Constitutional: Negative for chills, fever, malaise/fatigue and weight loss.  HENT: Negative for hearing loss and sinus pain.   Eyes: Negative for blurred vision, double vision and photophobia.  Respiratory: Negative for cough and shortness of breath.   Cardiovascular: Negative for chest pain, palpitations and leg swelling.  Gastrointestinal: Negative for abdominal pain, nausea and vomiting.  Genitourinary: Negative for flank pain.  Musculoskeletal: Positive for back pain. Negative for myalgias.       Thigh pain  Skin: Negative for itching and rash.  Neurological: Negative for tremors, focal weakness and weakness.  Endo/Heme/Allergies: Negative.   Psychiatric/Behavioral: Negative for depression.  All other systems reviewed and are negative.  Otherwise per HPI.  Assessment & Plan: Visit Diagnoses:  1. Chronic bilateral low back pain without sciatica   2. Spondylosis without myelopathy or radiculopathy, lumbar region   3. Myofascial pain  syndrome   4. Overweight (BMI 25.0-29.9)     Plan: Findings:  Chronic back pain intermittent worsening with flareups and essentially situation where there is lumbar sprain strain at times.  She is continue with home exercises and working through some soreness in both legs.  Initial bout of severity in the right hip has improved.  She had osteopathic manipulation by Dr. Gaspar Bidding.  She is no longer under his care but he is trying to, I believe, set up a practice on his own.  She may seek out his services for the  osteopathic manipulation.  She has some level of pain in the legs with fairly normal CT of the abdomen in terms of muscular spine reviewed.  X-ray images really do not show a lot of changes except in the upper lumbar level there is some degenerative change.  She could have some level of stenosis but is really doing well functionally.  She is still trying to lose weight and exercise.  I think at this point really no changes in medication and I think she should continue with the exercises.  In terms of diagnostic treatment I would recommend right-sided lumbar facet block probably of the lower facets.  We discussed this at length and she does want to proceed with that.  This would be with fluoroscopic guidance and as part of a comprehensive approach approach.  If she gets good relief then we could look at double block paradigm and radiofrequency ablation.  If she does not get much relief and pain is still persistent would probably look at MRI of the lumbar spine at that point.  Could regroup with specific physical therapist as well.    Meds & Orders: No orders of the defined types were placed in this encounter.  No orders of the defined types were placed in this encounter.   Follow-up: Return for Right lower lumbar facet block.   Procedures: No procedures performed  No notes on file   Clinical History: LUMBAR SPINE - COMPLETE 4+ VIEW  COMPARISON:  Plain films lumbar spine 09/18/2010.  FINDINGS: The patient has transitional lumbosacral anatomy. Rudimentary ribs are seen off T12. There is sacralization of L5 on the right. Straightening of the normal lumbar lordosis is present. No listhesis. Loss of disc space height and endplate spurring are seen at L2-3. Paraspinous structures demonstrate a large colonic stool burden.  IMPRESSION: Transitional lumbosacral anatomy. Please see numbering scheme above. Correlation with plain films of the thoracic spine could be used to determine correct  levels.  Degenerative disc disease L2-3.  Large colonic stool burden.   Electronically Signed   By: Drusilla Kanner M.D.   On: 07/01/2019 13:51   She reports that she has never smoked. She has never used smokeless tobacco. No results for input(s): HGBA1C, LABURIC in the last 8760 hours.  Objective:  VS:  HT:     WT:    BMI:      BP:117/70   HR:86bpm   TEMP: ( )   RESP:  Physical Exam  Ortho Exam Imaging: No results found.  Past Medical/Family/Surgical/Social History: Medications & Allergies reviewed per EMR, new medications updated. Patient Active Problem List   Diagnosis Date Noted   Hepatic steatosis, CT abdomen 07/04/2019   Overweight (BMI 25.0-29.9) 07/04/2019   Chronic right-sided low back pain with bilateral sciatica 07/04/2019   History of hysterectomy 06/09/2018   Laxity of abdominal wall 06/09/2018   History of cholecystectomy 06/09/2018   Abdominal pannus 06/09/2018  Adjustment insomnia 09/24/2017   Reactive depression 06/16/2017   GERD (gastroesophageal reflux disease) 06/16/2017   Past Medical History:  Diagnosis Date   Anemia    Anxiety    Reactive depression 06/16/2017   Family History  Problem Relation Age of Onset   Diabetes Mother    Cancer Mother    Breast cancer Mother    Ovarian cancer Mother    Melanoma Father    Hypertension Father    Hyperlipidemia Father    Hypertension Sister    Depression Sister    Hypertension Sister    Heart disease Sister    Depression Sister    Heart attack Maternal Grandmother    Breast cancer Paternal Grandmother    Past Surgical History:  Procedure Laterality Date   ABDOMINAL HYSTERECTOMY     CESAREAN SECTION     CHOLECYSTECTOMY     WRIST SURGERY Right    Social History   Occupational History   Occupation: Warehouse manager HR    Employer: [employer]  Tobacco Use   Smoking status: Never Smoker   Smokeless tobacco: Never Used  Substance and Sexual  Activity   Alcohol use: Yes    Alcohol/week: 2.0 standard drinks    Types: 2 Standard drinks or equivalent per week   Drug use: No   Sexual activity: Yes    Partners: Male    Birth control/protection: Surgical

## 2019-09-14 DIAGNOSIS — M25561 Pain in right knee: Secondary | ICD-10-CM | POA: Diagnosis not present

## 2019-09-22 NOTE — Telephone Encounter (Signed)
I have emailed charge corrections to resubmit claims with the following messages:   Please resubmit the claims for the following below. I spoke with Carrie Richards [id number] today who was able to get insurance information to resubmit claims for herself and her son Carrie Richards [id number]. I have applied the insurance to all dates she requested from 08/26/2018-11/25/2018 the claims just need to be resubmitted.  Insurance Information:  [employer] [place] Subscriber ID: [id number] Group ID: [id number]  Dates of Service For Resubmission for Carrie Richards [id number]:  09/25/2018 10/09/2018 10/17/2018 11/10/2018 11/13/2018  Dates of Service For Resubmission for Carrie Richards [id number]: 11/05/2018  Please let me know once this is processed successfully so I can contact Carrie Richards to update.   No need to route note, for documentation purposes only.

## 2019-09-25 NOTE — Telephone Encounter (Signed)
I contacted the patient and left a voicemail that billing is working on an appeal and they stated "In review it looks like these DOS were filed to that Member ID ,however, due the ID number missing the 01 and 06 the claims were rejected as no coverage for those DOS.  This caused them to file to Patient.   Please know these will now deny for timely filing as the Original claim submission was 90 days. we will try to watch for these and appeal with POF to try and get these paid, but not a 100% guarantee it will work."  Billing asked for the patient to contact them at [phone] for one of the billing reps if she has any further questions.   No further action required. No need to route note, for documentation purposes only.

## 2020-01-22 ENCOUNTER — Encounter: Payer: Self-pay | Admitting: Family Medicine

## 2020-01-22 ENCOUNTER — Ambulatory Visit (INDEPENDENT_AMBULATORY_CARE_PROVIDER_SITE_OTHER): Payer: BC Managed Care – PPO | Admitting: Family Medicine

## 2020-01-22 ENCOUNTER — Other Ambulatory Visit: Payer: Self-pay

## 2020-01-22 VITALS — BP 130/82 | HR 102 | Temp 97.2°F | Ht 62.0 in | Wt 162.4 lb

## 2020-01-22 DIAGNOSIS — E669 Obesity, unspecified: Secondary | ICD-10-CM | POA: Diagnosis not present

## 2020-01-22 DIAGNOSIS — F5102 Adjustment insomnia: Secondary | ICD-10-CM | POA: Diagnosis not present

## 2020-01-22 DIAGNOSIS — F329 Major depressive disorder, single episode, unspecified: Secondary | ICD-10-CM | POA: Diagnosis not present

## 2020-01-22 DIAGNOSIS — M5442 Lumbago with sciatica, left side: Secondary | ICD-10-CM

## 2020-01-22 DIAGNOSIS — M5441 Lumbago with sciatica, right side: Secondary | ICD-10-CM

## 2020-01-22 DIAGNOSIS — F419 Anxiety disorder, unspecified: Secondary | ICD-10-CM

## 2020-01-22 DIAGNOSIS — N390 Urinary tract infection, site not specified: Secondary | ICD-10-CM

## 2020-01-22 DIAGNOSIS — G8929 Other chronic pain: Secondary | ICD-10-CM

## 2020-01-22 MED ORDER — NITROFURANTOIN MONOHYD MACRO 100 MG PO CAPS: 100.0000 mg | ORAL_CAPSULE | Freq: Every day | ORAL | 1 refills | Status: DC

## 2020-01-22 MED ORDER — TOPIRAMATE 50 MG PO TABS: 50.0000 mg | ORAL_TABLET | Freq: Two times a day (BID) | ORAL | 1 refills | Status: DC

## 2020-01-22 MED ORDER — CYCLOBENZAPRINE HCL 10 MG PO TABS: 10.0000 mg | ORAL_TABLET | Freq: Three times a day (TID) | ORAL | 1 refills | Status: DC | PRN

## 2020-01-22 MED ORDER — LORAZEPAM 1 MG PO TABS: 0.5000 mg | ORAL_TABLET | Freq: Every evening | ORAL | 4 refills | Status: DC | PRN

## 2020-01-22 NOTE — Progress Notes (Signed)
Patient: Carrie Richards MRN: [id number] DOB: 30-Oct-1971 PCP: [name], DO     Subjective:  Chief Complaint  Patient presents with  . Transitions Of Care  . Medication Refill    HPI: The patient is a 49 y.o. female who presents today for transition of care. 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   Depression and anxiety: She is only on wellbutrin 150mg  and ativan 1mg  BID prn. She has had this for 2 years and the medication has really helped. She has had a lot with her family that triggered all of this. Her daughters are both getting married and her parents are missing out on a lot of this stuff. She doesn't talk to her sister. She does have panic attacks, but these have improved. She has them a few times a week, sometimes less.   Weight loss: on topamax and adipex. Has worked well for her.   Chronic UTI: on daily bactrim. Seen by urology in the past who started this, no longer follows them.   HM: UTD except for mmg.    Review of Systems  Constitutional: Negative for chills, fatigue and fever.  HENT: Negative for sinus pressure, sneezing and sore throat.   Respiratory: Negative for cough, shortness of breath and wheezing.   Cardiovascular: Negative for chest pain and palpitations.  Gastrointestinal: Negative for abdominal pain, nausea and vomiting.  Genitourinary: Negative for frequency, pelvic pain and urgency.  Neurological: Negative for dizziness, light-headedness and headaches.    Allergies Patient has No Known Allergies.  Past Medical History Patient  has a past medical history of Anemia, Anxiety, and Reactive depression (06/16/2017).  Surgical History Patient  has a past surgical history that includes Cesarean section; Cholecystectomy; Abdominal hysterectomy; and Wrist surgery (Right).  Family History Pateint's family history  includes Breast cancer in her mother and paternal grandmother; Cancer in her mother; Depression in her sister and sister; Diabetes in her mother; Heart attack in her maternal grandmother; Heart disease in her sister; Hyperlipidemia in her father; Hypertension in her father, sister, and sister; Melanoma in her father; Ovarian cancer in her mother.  Social History Patient  reports that she has never smoked. She has never used smokeless tobacco. She reports current alcohol use of about 2.0 standard drinks of alcohol per week. She reports that she does not use drugs.    Objective: Vitals:   01/22/20 1306  BP: 130/82  Pulse: (!) 102  Temp: (!) 97.2 F (36.2 C)  TempSrc: Temporal  SpO2: 97%  Weight: 162 lb 6.4 oz (73.7 kg)  Height: 5\' 2"  (1.575 m)    Body mass index is 29.7 kg/m.  Physical Exam Vitals reviewed.  Constitutional:      Appearance: Normal appearance. She is well-developed. She is obese.  HENT:     Head: Normocephalic and atraumatic.     Right Ear: Tympanic membrane, ear canal and external ear normal.     Left Ear: Tympanic membrane, ear canal and external ear normal.     Nose: Nose normal.     Mouth/Throat:     Mouth: Mucous membranes are moist.  Eyes:     Extraocular Movements: Extraocular movements intact.     Conjunctiva/sclera: Conjunctivae normal.     Pupils: Pupils are equal, round, and reactive to light.  Neck:     Thyroid: No thyromegaly.  Cardiovascular:     Rate and Rhythm:  Normal rate and regular rhythm.     Heart sounds: Normal heart sounds. No murmur.  Pulmonary:     Effort: Pulmonary effort is normal.     Breath sounds: Normal breath sounds.  Abdominal:     General: Bowel sounds are normal. There is no distension.     Palpations: Abdomen is soft.     Tenderness: There is no abdominal tenderness.  Musculoskeletal:     Cervical back: Normal range of motion and neck supple.  Lymphadenopathy:     Cervical: No cervical adenopathy.  Skin:     General: Skin is warm and dry.     Findings: No rash.  Neurological:     General: No focal deficit present.     Mental Status: She is alert and oriented to person, place, and time.     Cranial Nerves: No cranial nerve deficit.     Coordination: Coordination normal.     Deep Tendon Reflexes: Reflexes normal.  Psychiatric:        Mood and Affect: Mood normal.        Behavior: Behavior normal.     Comments: No si/hi/ah/vh           Office Visit from 01/22/2020 in [facility]  PHQ-9 Total Score  6      Assessment/plan: 1. Reactive depression -phq9 well controlled with mild score. Continue wellbutrin daily. Refills given. Recommended more exercising. F/u in 6 months.   2. Chronic right-sided low back pain with bilateral sciatica Flexeril as needed. Rarely takes, refills given.  - cyclobenzaprine (FLEXERIL) 10 MG tablet; Take 1 tablet (10 mg total) by mouth 3 (three) times daily as needed for muscle spasms.  Dispense: 30 tablet; Refill: 1  3. Adjustment insomnia Ativan prn for panic attacks/insomnia. pmp reviewed and drug contract signed today. F/u in 6 months. Increase exercise.  - LORazepam (ATIVAN) 1 MG tablet; Take 0.5-1 tablets (0.5-1 mg total) by mouth at bedtime as needed. for sleep  Dispense: 30 tablet; Refill: 4   4. Obesity (BMI 30-39.9) Will do topamax, but discussed I do not do adipex long term. She will continue on the topamax. BMI does not qualify her to be seen at healthy weight [facility] wellness.  - topiramate (TOPAMAX) 50 MG tablet; Take 1 tablet (50 mg total) by mouth 2 (two) times daily.  Dispense: 180 tablet; Refill: 1  5. Chronic UTI Bactrim refilled.   -needs to get mmg.   This visit occurred during the SARS-CoV-2 public health emergency.  Safety protocols were in place, including screening questions prior to the visit, additional usage of staff PPE, and extensive cleaning of exam room while observing appropriate contact time as indicated  for disinfecting solutions.     Return in about 6 months (around 07/21/2020) for annual/fasting labs .    Orland Mustard, MD [facility]   01/22/2020

## 2020-02-22 DIAGNOSIS — Z23 Encounter for immunization: Secondary | ICD-10-CM | POA: Diagnosis not present

## 2020-03-14 DIAGNOSIS — Z23 Encounter for immunization: Secondary | ICD-10-CM | POA: Diagnosis not present

## 2020-03-21 ENCOUNTER — Telehealth: Payer: Self-pay | Admitting: Family Medicine

## 2020-03-21 NOTE — Telephone Encounter (Signed)
FYI

## 2020-03-21 NOTE — Telephone Encounter (Signed)
Chief Complaint CHEST PAIN (>=21 years) - pain, pressure, heaviness or tightness Reason for Call Symptomatic / Request for [place] states her face and neck have been getting very red and hot; her chest and shoulders have been tight, and her head feels foggy. She received the second Covid vaccine on the 19th. Translation No Nurse Assessment Nurse: Wynetta Emery, RN, Santiago Glad Date/Time Eilene Ghazi Time): 03/19/2020 5:14:31 PM Confirm and document reason for call. If symptomatic, describe symptoms. ---Caller states that neck and face are very red- feels hot. Caller states that chest feels tight, mild headache. No Temp. VSS are fine. Received 2nd covid shot on the 19th. Symptoms started yesterday. Has the patient had close contact with a person known or suspected to have the novel coronavirus illness OR traveled / lives in area with major community spread (including international travel) in the last 14 days from the onset of symptoms? * If Asymptomatic, screen for exposure and travel within the last 14 days. ---No Does the patient have any new or worsening symptoms? ---Yes Will a triage be completed? ---Yes Related visit to physician within the last 2 weeks? ---Yes Does the PT have any chronic conditions? (i.e. diabetes, asthma, this includes High risk factors for pregnancy, etc.) ---No Is the patient pregnant or possibly pregnant? (Ask all females between the ages of 63-55) ---No Is this a behavioral health or substance abuse call? ---No Guidelines Guideline Title Affirmed Question Affirmed Notes Nurse Date/Time (Eastern Time) Chest Pain [1] Chest pain (or "angina") comes and goes AND [2] is happening Wynetta Emery, RN, Santiago Glad 03/19/2020 5:17:39 PMPLEASE NOTE: All timestamps contained within this report are represented as [place] Standard Time. CONFIDENTIALTY NOTICE: This fax transmission is intended only for the addressee. It contains information that is legally  privileged, confidential or otherwise protected from use or disclosure. If you are not the intended recipient, you are strictly prohibited from reviewing, disclosing, copying using or disseminating any of this information or taking any action in reliance on or regarding this information. If you have received this fax in error, please notify us immediately by telephone so that we can arrange for its return to [place]. Phone: [phone], Toll-Free: [phone], Fax: [phone] Page: 2 of 2 Call Id: [id number] Guidelines Guideline Title Affirmed Question Affirmed Notes Nurse Date/Time Eilene Ghazi Time) more often (increasing in frequency) or getting worse (increasing in severity) (Exception: chest pains that last only a few seconds) Disp. Time Eilene Ghazi Time) Disposition Final User 03/19/2020 5:13:20 PM Send to Urgent Queue Monserrate Cellar 03/19/2020 5:22:34 PM Go to ED Now Yes Wynetta Emery, RN, [place] P[place] Disagree/Comply Comply Caller Understands Yes PreDisposition Did not know what to do Care Advice Given Per Guideline GO TO ED NOW: * You need to be seen in the Emergency Department. BRING MEDICINES: * Please bring a list of your current medicines when you go to the Emergency Department (ER). Also take Vaccine Information Card with you. ANOTHER ADULT SHOULD DRIVE: * Do not eat or drink anything for now. CALL EMS IF: * Severe difficulty breathing occurs * Passes out or becomes too weak to stand * You become worse. Referrals [place]

## 2020-05-16 ENCOUNTER — Encounter: Payer: Self-pay | Admitting: Family Medicine

## 2020-05-20 ENCOUNTER — Other Ambulatory Visit: Payer: Self-pay | Admitting: Family Medicine

## 2020-05-20 MED ORDER — PHENTERMINE HCL 37.5 MG PO CAPS: 37.5000 mg | ORAL_CAPSULE | ORAL | 1 refills | Status: DC

## 2020-05-20 NOTE — Telephone Encounter (Signed)
Pharmacy requesting Phentermine 30mg  capsule LOV: 01/22/2020 No future visits scheduled  Please advise.

## 2020-06-17 ENCOUNTER — Other Ambulatory Visit: Payer: Self-pay | Admitting: Family Medicine

## 2020-06-17 NOTE — Telephone Encounter (Signed)
[place] [place] Database Verified LR: 05-20-2020 Qty: 30 Last office visit: 01-22-2020 Upcoming appointment: No pending appt

## 2020-08-03 ENCOUNTER — Other Ambulatory Visit: Payer: Self-pay | Admitting: Family Medicine

## 2020-08-03 DIAGNOSIS — E669 Obesity, unspecified: Secondary | ICD-10-CM

## 2020-08-03 MED ORDER — TOPIRAMATE 50 MG PO TABS: 50.0000 mg | ORAL_TABLET | Freq: Two times a day (BID) | ORAL | 1 refills | Status: DC

## 2020-09-15 ENCOUNTER — Other Ambulatory Visit: Payer: Self-pay | Admitting: Family Medicine

## 2020-12-02 ENCOUNTER — Emergency Department (HOSPITAL_BASED_OUTPATIENT_CLINIC_OR_DEPARTMENT_OTHER): Payer: No Typology Code available for payment source

## 2020-12-02 ENCOUNTER — Other Ambulatory Visit: Payer: Self-pay

## 2020-12-02 ENCOUNTER — Observation Stay (HOSPITAL_BASED_OUTPATIENT_CLINIC_OR_DEPARTMENT_OTHER)
Admission: EM | Admit: 2020-12-02 | Discharge: 2020-12-04 | Disposition: A | Discharge status: Home / Self Care | Payer: No Typology Code available for payment source | Attending: Emergency Medicine | Admitting: Emergency Medicine

## 2020-12-02 ENCOUNTER — Telehealth: Payer: Self-pay

## 2020-12-02 ENCOUNTER — Encounter (HOSPITAL_BASED_OUTPATIENT_CLINIC_OR_DEPARTMENT_OTHER): Payer: Self-pay | Admitting: Emergency Medicine

## 2020-12-02 DIAGNOSIS — B9689 Other specified bacterial agents as the cause of diseases classified elsewhere: Secondary | ICD-10-CM | POA: Diagnosis not present

## 2020-12-02 DIAGNOSIS — R55 Syncope and collapse: Secondary | ICD-10-CM | POA: Diagnosis not present

## 2020-12-02 DIAGNOSIS — N390 Urinary tract infection, site not specified: Secondary | ICD-10-CM | POA: Diagnosis not present

## 2020-12-02 DIAGNOSIS — Z20822 Contact with and (suspected) exposure to covid-19: Secondary | ICD-10-CM | POA: Diagnosis not present

## 2020-12-02 DIAGNOSIS — R0789 Other chest pain: Secondary | ICD-10-CM | POA: Diagnosis present

## 2020-12-02 DIAGNOSIS — Z79899 Other long term (current) drug therapy: Secondary | ICD-10-CM | POA: Diagnosis not present

## 2020-12-02 DIAGNOSIS — E669 Obesity, unspecified: Secondary | ICD-10-CM | POA: Insufficient documentation

## 2020-12-02 DIAGNOSIS — R000 Tachycardia, unspecified: Secondary | ICD-10-CM

## 2020-12-02 LAB — RESP PANEL BY RT-PCR (FLU A&B, COVID) ARPGX2
Influenza A by PCR: NEGATIVE
Influenza B by PCR: NEGATIVE
SARS Coronavirus 2 by RT PCR: NEGATIVE

## 2020-12-02 LAB — TROPONIN I (HIGH SENSITIVITY)
Troponin I (High Sensitivity): <2 ng/L (ref <18)
Troponin I (High Sensitivity): <2 ng/L (ref <18)

## 2020-12-02 LAB — BASIC METABOLIC PANEL
Anion gap: 11 (ref 5–15)
BUN: 18 mg/dL (ref 6–20)
CO2: 25 mmol/L (ref 22–32)
Calcium: 9.7 mg/dL (ref 8.9–10.3)
Chloride: 104 mmol/L (ref 98–111)
Creatinine, Ser: 0.86 mg/dL (ref 0.44–1.00)
GFR, Estimated: >60 mL/min (ref >60)
Glucose, Bld: 125 mg/dL — ABNORMAL HIGH (ref 70–99)
Potassium: 4.1 mmol/L (ref 3.5–5.1)
Sodium: 140 mmol/L (ref 135–145)

## 2020-12-02 LAB — CBC
HCT: 45.9 % (ref 36.0–46.0)
Hemoglobin: 15 g/dL (ref 12.0–15.0)
MCH: 31.7 pg (ref 26.0–34.0)
MCHC: 32.7 g/dL (ref 30.0–36.0)
MCV: 97 fL (ref 80.0–100.0)
Platelets: 367 10*3/uL (ref 150–400)
RBC: 4.73 MIL/uL (ref 3.87–5.11)
RDW: 11.9 % (ref 11.5–15.5)
WBC: 8.6 10*3/uL (ref 4.0–10.5)
nRBC: 0 % (ref 0.0–0.2)

## 2020-12-02 LAB — D-DIMER, QUANTITATIVE: D-Dimer, Quant: <0.27 ug/mL-FEU (ref 0.00–0.50)

## 2020-12-02 MED ORDER — LIDOCAINE VISCOUS HCL 2 % MT SOLN
15.0000 mL | Freq: Once | OROMUCOSAL | Status: AC
  Administered 2020-12-02: 15 mL via ORAL
  Filled 2020-12-02: qty 15

## 2020-12-02 MED ORDER — SODIUM CHLORIDE 0.9 % IV BOLUS
500.0000 mL | Freq: Once | INTRAVENOUS | Status: AC
  Administered 2020-12-02: 500 mL via INTRAVENOUS

## 2020-12-02 MED ORDER — ALUM & MAG HYDROXIDE-SIMETH 200-200-20 MG/5ML PO SUSP
30.0000 mL | Freq: Once | ORAL | Status: AC
  Administered 2020-12-02: 30 mL via ORAL
  Filled 2020-12-02: qty 30

## 2020-12-02 NOTE — ED Triage Notes (Signed)
Reports sob and chest pressure/tightness for the last 5 days.  Endorses pain radiates into her back.  Reports having brow lift procedure on 12/10.  Also reports three episodes of being dizzy with everything going black and she went over.  Last episode was two days ago.

## 2020-12-02 NOTE — ED Notes (Signed)
Called report to [place], Therapist, sports. Family updated about ETA of transport and room pt is going to.

## 2020-12-02 NOTE — ED Notes (Signed)
Hand off report to [facility] with [facility]

## 2020-12-02 NOTE — ED Notes (Signed)
Orthostatic baseline laying flat for 10 min

## 2020-12-02 NOTE — ED Provider Notes (Signed)
[facility] EMERGENCY DEPARTMENT Provider Note   CSN: [id number] Arrival date & time: 12/02/20  1333     History Chief Complaint  Patient presents with  . Chest Pain  . Shortness of Breath    Carrie Richards is a 50 y.o. female history of GERD, hepatic steatosis, chronic back pain/sciatica, anemia, anxiety, hysterectomy, cholecystectomy.  Patient reports she has been experiencing 5 days of shortness of breath and chest tightness, symptoms have been intermittent coming on randomly sometimes when she is sitting on the couch.  She reports shortness of breath is a feeling of difficulty catching her breath associated with a mild tight pressure in the center of her chest that does not radiate no clear aggravating or alleviating factors.  She denies similar symptoms in the past.  She reports she had 1 - Covid test in the past week.  Patient reports that over the past 5 days she has experienced 3 episodes of lightheadedness and believes that she may have syncopized 2 days ago while standing at her dresser.  She denies any injury, preceding headache vision changes or chest pain.  Symptoms associated with mild headache, cough and fatigue.  Of note patient reports her grandmother had MI but not a first degree relative.  She also reports that both her grandmother and her mother have had cerebral aneurysms.  Of note patient reports that she had eyebrow lift surgery on 11/04/2021.  Denies measured fever, denies chills, vision changes, sore throat, neck stiffness, hemoptysis, abdominal pain, diaphoresis, nausea/vomiting, diarrhea, extremity swelling/color change, history of blood clot, exogenous hormone use or additional concerns.  HPI     Past Medical History:  Diagnosis Date  . Anemia   . Anxiety   . Reactive depression 06/16/2017    Patient Active Problem List   Diagnosis Date Noted  . Syncope 12/02/2020  . Chronic UTI 01/22/2020  . Anxiety 01/22/2020  . Hepatic steatosis, CT  abdomen 07/04/2019  . Chronic right-sided low back pain with bilateral sciatica 07/04/2019  . 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 GERD (gastroesophageal reflux disease) 06/16/2017    Past Surgical History:  Procedure Laterality Date  . ABDOMINAL HYSTERECTOMY    . CESAREAN SECTION    . CHOLECYSTECTOMY    . WRIST SURGERY Right      OB History    Gravida  4   Para  4   Term  4   Preterm      AB      Living  5     SAB      IAB      Ectopic      Multiple  1   Live Births  5           Family History  Problem Relation Age of Onset  . Diabetes Mother   . Cancer Mother   . Breast cancer Mother   . Ovarian cancer Mother   . Melanoma Father   . Hypertension Father   . Hyperlipidemia Father   . Hypertension Sister   . Depression Sister   . Hypertension Sister   . Heart disease Sister   . Depression Sister   . Heart attack Maternal Grandmother   . Breast cancer Paternal Grandmother     Social History   Tobacco Use  . Smoking status: Never Smoker  . Smokeless tobacco: Never Used  Vaping Use  . Vaping Use: Never used  Substance Use Topics  . Alcohol use: Yes  Alcohol/week: 2.0 standard drinks    Types: 2 Standard drinks or equivalent per week  . Drug use: No    Home Medications Prior to Admission medications   Medication Sig Start Date End Date Taking? Authorizing Provider  buPROPion (WELLBUTRIN XL) 150 MG 24 hr tablet Take 1 tablet (150 mg total) by mouth daily. 03/16/19   Briscoe Deutscher, DO  cyclobenzaprine (FLEXERIL) 10 MG tablet Take 1 tablet (10 mg total) by mouth 3 (three) times daily as needed for muscle spasms. 01/22/20   Orma Flaming, MD  LORazepam (ATIVAN) 1 MG tablet Take 0.5-1 tablets (0.5-1 mg total) by mouth at bedtime as needed. for sleep 01/22/20   Orma Flaming, MD  mupirocin ointment (BACTROBAN) 2 % mupirocin 2 % topical ointment    [provider]  nitrofurantoin,  macrocrystal-monohydrate, (MACROBID) 100 MG capsule TAKE 1 CAPSULE BY MOUTH AT BEDTIME. 09/15/20   Orma Flaming, MD  phentermine (ADIPEX-P) 37.5 MG tablet TAKE 1 TABLET BY MOUTH DAILY BEFORE BREAKFAST 06/17/20   Orma Flaming, MD  topiramate (TOPAMAX) 50 MG tablet Take 1 tablet (50 mg total) by mouth 2 (two) times daily. 08/03/20   Orma Flaming, MD    Allergies    Patient has no known allergies.  Review of Systems   Review of Systems Ten systems are reviewed and are negative for acute change except as noted in the HPI Physical Exam Updated Vital Signs BP (!) 134/97 (BP Location: Right Arm)   Pulse (!) 107   Temp 97.8 F (36.6 C) (Oral)   Resp 20   Ht 5\' 2"  (1.575 m)   Wt 72.6 kg   LMP  (LMP Unknown)   SpO2 99%   BMI 29.26 kg/m   Physical Exam Constitutional:      General: She is not in acute distress.    Appearance: Normal appearance. She is well-developed. She is not ill-appearing or diaphoretic.  HENT:     Head: Normocephalic and atraumatic.  Eyes:     General: Vision grossly intact. Gaze aligned appropriately.     Pupils: Pupils are equal, round, and reactive to light.  Neck:     Trachea: Trachea and phonation normal.     Meningeal: Brudzinski's sign absent.  Cardiovascular:     Rate and Rhythm: Regular rhythm. Tachycardia present.     Pulses:          Dorsalis pedis pulses are 2+ on the right side and 2+ on the left side.  Pulmonary:     Effort: Pulmonary effort is normal. No respiratory distress.  Abdominal:     General: There is no distension.     Palpations: Abdomen is soft.     Tenderness: There is no abdominal tenderness. There is no guarding or rebound.  Musculoskeletal:        General: Normal range of motion.     Cervical back: Normal range of motion and neck supple.     Right lower leg: No tenderness. No edema.     Left lower leg: No tenderness. No edema.  Skin:    General: Skin is warm and dry.  Neurological:     Mental Status: She is alert.      GCS: GCS eye subscore is 4. GCS verbal subscore is 5. GCS motor subscore is 6.     Comments: Speech is clear and goal oriented, follows commands Major Cranial nerves without deficit, no facial droop Normal strength in upper and lower extremities bilaterally including dorsiflexion and plantar flexion, strong and  equal grip strength Sensation normal to light and sharp touch Moves extremities without ataxia, coordination intact Normal finger to nose and rapid alternating movements No pronator drift Normal heel-shin  Psychiatric:        Behavior: Behavior normal.     ED Results / Procedures / Treatments   Labs (all labs ordered are listed, but only abnormal results are displayed) Labs Reviewed  BASIC METABOLIC PANEL - Abnormal; Notable for the following components:      Result Value   Glucose, Bld 125 (*)    All other components within normal limits  RESP PANEL BY RT-PCR (FLU A&B, COVID) ARPGX2  CBC  D-DIMER, QUANTITATIVE (NOT AT [facility])  TROPONIN I (HIGH SENSITIVITY)  TROPONIN I (HIGH SENSITIVITY)    EKG EKG Interpretation  Date/Time:  Friday December 02 2020 13:39:54 EST Ventricular Rate:  132 PR Interval:  138 QRS Duration: 72 QT Interval:  290 QTC Calculation: 429 R Axis:   94 Text Interpretation: Sinus tachycardia Rightward axis Borderline ECG Confirmed by Quintella Reichert [phone]) on 12/02/2020 5:03:36 PM   Radiology DG Chest Portable 1 View  Result Date: 12/02/2020 CLINICAL DATA:  Chest pain and shortness of breath EXAM: PORTABLE CHEST 1 VIEW COMPARISON:  12/03/2012 FINDINGS: The heart size and mediastinal contours are within normal limits. Both lungs are clear. The visualized skeletal structures are unremarkable. IMPRESSION: No active disease. Electronically Signed   By: Jerilynn Mages.  Shick M.D.   On: 12/02/2020 15:00    Procedures Procedures (including critical care time)  Medications Ordered in ED Medications  sodium chloride 0.9 % bolus 500 mL (500 mLs Intravenous New  Bag/Given 12/02/20 1521)  alum & mag hydroxide-simeth (MAALOX/MYLANTA) 200-200-20 MG/5ML suspension 30 mL (30 mLs Oral Given 12/02/20 1514)    And  lidocaine (XYLOCAINE) 2 % viscous mouth solution 15 mL (15 mLs Oral Given 12/02/20 1514)    ED Course  I have reviewed the triage vital signs and the nursing notes.  Pertinent labs & imaging results that were available during my care of the patient were reviewed by me and considered in my medical decision making (see chart for details).    MDM Rules/Calculators/A&P                         Additional history obtained from: 1. Nursing notes from this visit. 2. Review of electronic medical records.  No pertinent recent ER visits. ----------------------- I ordered, reviewed and interpreted labs which include: CBC within normal limits, no leukocytosis to suggest bacterial infection, no anemia. High-sensitivity troponin within normal limits, reassuring. D-dimer negative, lower suspicion for pulmonary embolism. BMP shows no emergent electrolyte derangement, AKI or gap.  EKG: Sinus tachycardia Rightward axis Borderline ECG Confirmed by Quintella Reichert [phone]) on 12/02/2020 5:03:36 PM  CXR:  IMPRESSION:  No active disease.   Patient ambulated without hypoxia but did show tachycardia and shortness of breath/chest pain with ambulation. No orthostatic hypotension. Concern given patient's persistent tachycardia as well as multiple episodes of syncope in the last week. Patient seen and evaluated by Dr. Ralene Bathe, concern for high risk syncope given multiple episodes and tachycardia, will consult hospitalist team for admission. - 5:57 PM: Consult with hospitalist Dr. Lorin Mercy. Patient accepted for admission.  LAYNE BENCH was evaluated in Emergency Department on 12/02/2020 for the symptoms described in the history of present illness. She was evaluated in the context of the global COVID-19 pandemic, which necessitated consideration that the patient might be at risk for  infection  with the SARS-CoV-2 virus that causes COVID-19. Institutional protocols and algorithms that pertain to the evaluation of patients at risk for COVID-19 are in a state of rapid change based on information released by regulatory bodies including the CDC and federal and state organizations. These policies and algorithms were followed during the patient's care in the ED.  Note: Portions of this report may have been transcribed using voice recognition software. Every effort was made to ensure accuracy; however, inadvertent computerized transcription errors may still be present. Final Clinical Impression(s) / ED Diagnoses Final diagnoses:  Syncope, unspecified syncope type  Tachycardia    Rx / DC Orders ED Discharge Orders    None       Gari Crown 12/02/20 1805    Quintella Reichert, MD 12/02/20 [phone]

## 2020-12-02 NOTE — ED Notes (Signed)
Maintained 100% SpO2, HR 120-126 while ambulating. Pt stated "sob and chest pain, felt better sitting down"

## 2020-12-02 NOTE — Progress Notes (Signed)
Pt. Arrived to the unit. MD notified.

## 2020-12-02 NOTE — Progress Notes (Signed)
Patient with h/o depression/anxiety presenting to [facility] with SOB, CP/tightness x 5 days with syncope x 3.  Normal exam other than mild tachycardia.  Negative troponin and D-dimer.  Based on this information, she has no known CVD risk factors, does not appear to have high risk syncope, and appears to be appropriate for outpatient f/u with Echo early next week.  However, if EDP is still concerned about her reported recurrent syncope episodes, she can be observed on telemetry with echo.  This will require transfer and there are not likely to be any available beds in the foreseeable future.  Will place observation order for now and defer to the judgement of the EDP at [facility].   Carlyon Shadow, M.D.

## 2020-12-02 NOTE — Telephone Encounter (Signed)
Nurse Assessment Nurse: Hassell Done, RN, Melanie Date/Time Eilene Ghazi Time): 12/02/2020 12:04:17 PM Confirm and document reason for call. If symptomatic, describe symptoms. ---Caller states she is having SOB and chest heaviness. Has had for a week and a half. Chest pressure comes and goes with the breathing. Does the patient have any new or worsening symptoms? ---Yes Will a triage be completed? ---Yes Related visit to physician within the last 2 weeks? ---Yes Does the PT have any chronic conditions? (i.e. diabetes, asthma, this includes High risk factors for pregnancy, etc.) ---No Is the patient pregnant or possibly pregnant? (Ask all females between the ages of 12-55) ---No Is this a behavioral health or substance abuse call? ---No Guidelines Guideline Title Affirmed Question Affirmed Notes Nurse Date/Time (Eastern Time) Chest Pain [1] Chest pain (or "angina") comes and goes AND [2] is happening more often (increasing in frequency) or getting worse (increasing in severity) (Exception: chest pains that last only a few seconds) Hassell Done, RN, Melanie 12/02/2020 12:06:02 PM PLEASE NOTE: All timestamps contained within this report are represented as [place] Standard Time. CONFIDENTIALTY NOTICE: This fax transmission is intended only for the addressee. It contains information that is legally privileged, confidential or otherwise protected from use or disclosure. If you are not the intended recipient, you are strictly prohibited from reviewing, disclosing, copying using or disseminating any of this information or taking any action in reliance on or regarding this information. If you have received this fax in error, please notify us immediately by telephone so that we can arrange for its return to [place]. Phone: [phone], Toll-Free: [phone], Fax: [phone] Page: 2 of 2 Call Id: [id number] [place]. Time Eilene Ghazi Time) Disposition Final User 12/02/2020 12:02:58 PM Send to Urgent Queue Idolina Primer 12/02/2020 12:11:12 PM Go to ED Now Yes Hassell Done, RN, Donnajean Lopes Disagree/Comply Comply Caller Understands Yes PreDisposition Go to Urgent Care/Walk-In Clinic Care Advice Given Per Guideline GO TO ED NOW: * You need to be seen in the Emergency Department. * Go to the ED at ___________ [facility]: * Do not eat or drink anything for now. CALL EMS IF: * Severe difficulty breathing occurs * Passes out or becomes too weak to stand * You become worse CARE ADVICE given per Chest Pain (Adult) guideline. Referrals GO TO FACILITY OTHER - SPECIF

## 2020-12-03 ENCOUNTER — Encounter (HOSPITAL_COMMUNITY): Payer: Self-pay | Admitting: Internal Medicine

## 2020-12-03 ENCOUNTER — Observation Stay (HOSPITAL_BASED_OUTPATIENT_CLINIC_OR_DEPARTMENT_OTHER): Payer: No Typology Code available for payment source

## 2020-12-03 DIAGNOSIS — R55 Syncope and collapse: Secondary | ICD-10-CM | POA: Diagnosis not present

## 2020-12-03 DIAGNOSIS — R000 Tachycardia, unspecified: Secondary | ICD-10-CM

## 2020-12-03 LAB — HIV ANTIBODY (ROUTINE TESTING W REFLEX): HIV Screen 4th Generation wRfx: NONREACTIVE

## 2020-12-03 LAB — BASIC METABOLIC PANEL
Anion gap: 10 (ref 5–15)
BUN: 15 mg/dL (ref 6–20)
CO2: 25 mmol/L (ref 22–32)
Calcium: 8.9 mg/dL (ref 8.9–10.3)
Chloride: 104 mmol/L (ref 98–111)
Creatinine, Ser: 0.78 mg/dL (ref 0.44–1.00)
GFR, Estimated: >60 mL/min (ref >60)
Glucose, Bld: 103 mg/dL — ABNORMAL HIGH (ref 70–99)
Potassium: 3.9 mmol/L (ref 3.5–5.1)
Sodium: 139 mmol/L (ref 135–145)

## 2020-12-03 LAB — ECHOCARDIOGRAM COMPLETE
AR max vel: 2.17 cm2
AV Area VTI: 2.61 cm2
AV Area mean vel: 2.48 cm2
AV Mean grad: 3 mmHg
AV Peak grad: 6.1 mmHg
Ao pk vel: 1.23 m/s
Area-P 1/2: 2.87 cm2
Height: 62 in
S' Lateral: 2.7 cm
Weight: 2780.8 oz

## 2020-12-03 LAB — CBC
HCT: 41.4 % (ref 36.0–46.0)
Hemoglobin: 13.6 g/dL (ref 12.0–15.0)
MCH: 31.9 pg (ref 26.0–34.0)
MCHC: 32.9 g/dL (ref 30.0–36.0)
MCV: 97 fL (ref 80.0–100.0)
Platelets: 311 10*3/uL (ref 150–400)
RBC: 4.27 MIL/uL (ref 3.87–5.11)
RDW: 11.9 % (ref 11.5–15.5)
WBC: 9.5 10*3/uL (ref 4.0–10.5)
nRBC: 0 % (ref 0.0–0.2)

## 2020-12-03 LAB — RAPID URINE DRUG SCREEN, HOSP PERFORMED
Amphetamines: NOT DETECTED
Barbiturates: NOT DETECTED
Benzodiazepines: POSITIVE — AB
Cocaine: NOT DETECTED
Opiates: NOT DETECTED
Tetrahydrocannabinol: NOT DETECTED

## 2020-12-03 LAB — TSH: TSH: 2.38 u[IU]/mL (ref 0.350–4.500)

## 2020-12-03 MED ORDER — LORAZEPAM 0.5 MG PO TABS: 0.5000 mg | ORAL_TABLET | Freq: Every evening | ORAL | Status: DC | PRN

## 2020-12-03 MED ORDER — BUPROPION HCL ER (XL) 150 MG PO TB24
150.0000 mg | ORAL_TABLET | Freq: Every day | ORAL | Status: DC
  Administered 2020-12-03 – 2020-12-04 (×2): 150 mg via ORAL
  Filled 2020-12-03 (×2): qty 1

## 2020-12-03 MED ORDER — ACETAMINOPHEN 325 MG PO TABS
650.0000 mg | ORAL_TABLET | Freq: Four times a day (QID) | ORAL | Status: DC | PRN
  Administered 2020-12-03 – 2020-12-04 (×3): 650 mg via ORAL
  Filled 2020-12-03 (×3): qty 2

## 2020-12-03 MED ORDER — NITROFURANTOIN MONOHYD MACRO 100 MG PO CAPS
100.0000 mg | ORAL_CAPSULE | Freq: Every day | ORAL | Status: DC
  Administered 2020-12-03 (×2): 100 mg via ORAL
  Filled 2020-12-03 (×3): qty 1

## 2020-12-03 MED ORDER — TOPIRAMATE 25 MG PO TABS
50.0000 mg | ORAL_TABLET | Freq: Two times a day (BID) | ORAL | Status: DC
  Administered 2020-12-03 – 2020-12-04 (×4): 50 mg via ORAL
  Filled 2020-12-03 (×4): qty 2

## 2020-12-03 MED ORDER — ENOXAPARIN SODIUM 40 MG/0.4ML ~~LOC~~ SOLN
40.0000 mg | SUBCUTANEOUS | Status: DC
  Administered 2020-12-03 – 2020-12-04 (×2): 40 mg via SUBCUTANEOUS
  Filled 2020-12-03 (×2): qty 0.4

## 2020-12-03 MED ORDER — ACETAMINOPHEN 650 MG RE SUPP: 650.0000 mg | Freq: Four times a day (QID) | RECTAL | Status: DC | PRN

## 2020-12-03 NOTE — Progress Notes (Signed)
PROGRESS NOTE    Carrie Richards  ZHY:[id number] DOB: 07-01-1971 DOA: 12/02/2020 PCP: Orland Mustard, MD   Brief Narrative:  HPI on 12/03/2020 by Dr. Midge Minium  Carrie Richards is a 50 y.o. female with history of anxiety depression adjustment insomnia recurrent UTI was brought to the ER after patient had a syncopal episode.  Patient states she has had at least 3 syncopal episode over the last 1 week.  The last one was 2 days ago when patient was in the bathtub taking a bath.  Prior to the episode patient gets tachycardic palpitations shortness of breath dizzy and loses consciousness for a few seconds.  Has not had any incontinence of urine or bowel.  During the first episode patient was trying to bend and pick something when she lost consciousness.  Patient has had recent surgery for eyelids about a month ago.  Patient states she wears Apple Watch and it at times did indicate tachycardia in the watch.  Interim history Admitted for syncope.  Pending echocardiogram carotid Doppler.  Assessment & Plan   Syncope -Unclear etiology -Patient stated that she had dizziness and shortness of breath along with palpitations prior to her episodes -TSH 2.38 -D-dimer was unremarkable -Echocardiogram pending -Orthostatic vitals -Carotid Doppler pending  Insomnia/anxiety/Depression -Continue lorazepam PRN and Wellbutrin  Recent history of UTI -Continue Macrodantin  Obesity -On phentermine however currently held  DVT Prophylaxis Lovenox  Code Status: Full  Family Communication: None at bedside  Disposition Plan:  Status is: Observation  The patient remains OBS appropriate and will d/c before 2 midnights.  Dispo: The patient is from: Home              Anticipated d/c is to: Home              Anticipated d/c date is: 1 day              Patient currently is not medically stable to d/c.   Consultants None  Procedures  Echocardiogram  Antibiotics   Anti-infectives (From admission,  onward)   Start     Dose/Rate Route Frequency Ordered Stop   12/03/20 0200  nitrofurantoin (macrocrystal-monohydrate) (MACROBID) capsule 100 mg        100 mg Oral Daily at bedtime 12/03/20 0111        Subjective:   Carrie Richards seen and examined today.  Patient denies any current chest pain, shortness of breath, abdominal pain, nausea vomiting, diarrhea constipation, dizziness or headache.  Objective:   Vitals:   12/02/20 2248 12/03/20 0023 12/03/20 0302 12/03/20 0810  BP: 130/87 132/83 111/66 102/76  Pulse: 91 84 92 81  Resp: 20 18 18 20   Temp: (!) 97.5 F (36.4 C) 97.7 F (36.5 C) 97.7 F (36.5 C) 97.7 F (36.5 C)  TempSrc: Oral Oral Oral Oral  SpO2: 96% 98% 95% 97%  Weight: 79.4 kg  78.8 kg   Height: 5\' 2"  (1.575 m)       Intake/Output Summary (Last 24 hours) at 12/03/2020 1305 Last data filed at 12/03/2020 0853 Gross per 24 hour  Intake 747.02 ml  Output 300 ml  Net 447.02 ml   Filed Weights   12/02/20 1342 12/02/20 2248 12/03/20 0302  Weight: 72.6 kg 79.4 kg 78.8 kg    Exam  General: Well developed, well nourished, NAD, appears stated age  HEENT: NCAT, mucous membranes moist.   Neck: Supple, no JVD, no masses  Cardiovascular: S1 S2 auscultated, RRR  Respiratory: Clear to auscultation bilaterally  with equal chest rise  Abdomen: Soft, nontender, nondistended, + bowel sounds  Extremities: warm dry without cyanosis clubbing or edema  Neuro: AAOx3, nonfocal  Psych: Normal affect and demeanor with intact judgement and insight   Data Reviewed: I have personally reviewed following labs and imaging studies  CBC: Recent Labs  Lab 12/02/20 1356 12/03/20 0322  WBC 8.6 9.5  HGB 15.0 13.6  HCT 45.9 41.4  MCV 97.0 97.0  PLT 367 311   Basic Metabolic Panel: Recent Labs  Lab 12/02/20 1356 12/03/20 0322  NA 140 139  K 4.1 3.9  CL 104 104  CO2 25 25  GLUCOSE 125* 103*  BUN 18 15  CREATININE 0.86 0.78  CALCIUM 9.7 8.9   GFR: Estimated Creatinine  Clearance: 82.7 mL/min (by C-G formula based on SCr of 0.78 mg/dL). Liver Function Tests: No results for input(s): AST, ALT, ALKPHOS, BILITOT, PROT, ALBUMIN in the last 168 hours. No results for input(s): LIPASE, AMYLASE in the last 168 hours. No results for input(s): AMMONIA in the last 168 hours. Coagulation Profile: No results for input(s): INR, PROTIME in the last 168 hours. Cardiac Enzymes: No results for input(s): CKTOTAL, CKMB, CKMBINDEX, TROPONINI in the last 168 hours. BNP (last 3 results) No results for input(s): PROBNP in the last 8760 hours. HbA1C: No results for input(s): HGBA1C in the last 72 hours. CBG: No results for input(s): GLUCAP in the last 168 hours. Lipid Profile: No results for input(s): CHOL, HDL, LDLCALC, TRIG, CHOLHDL, LDLDIRECT in the last 72 hours. Thyroid Function Tests: Recent Labs    12/03/20 0322  TSH 2.380   Anemia Panel: No results for input(s): VITAMINB12, FOLATE, FERRITIN, TIBC, IRON, RETICCTPCT in the last 72 hours. Urine analysis:    Component Value Date/Time   BILIRUBINUR Small 02/03/2018 1440   PROTEINUR Trace 02/03/2018 1440   UROBILINOGEN 4.0 (A) 02/03/2018 1440   NITRITE Positive 02/03/2018 1440   LEUKOCYTESUR Large (3+) (A) 02/03/2018 1440   Sepsis Labs: @LABRCNTIP (procalcitonin:4,lacticidven:4)  ) Recent Results (from the past 240 hour(s))  Resp Panel by RT-PCR (Flu A&B, Covid) Nasopharyngeal Swab     Status: None   Collection Time: 12/02/20  5:25 PM   Specimen: Nasopharyngeal Swab; Nasopharyngeal(NP) swabs in vial transport medium  Result Value Ref Range Status   SARS Coronavirus 2 by RT PCR NEGATIVE NEGATIVE Final    Comment: (NOTE) SARS-CoV-2 target nucleic acids are NOT DETECTED.  The SARS-CoV-2 RNA is generally detectable in upper respiratory specimens during the acute phase of infection. The lowest concentration of SARS-CoV-2 viral copies this assay can detect is 138 copies/mL. A negative result does not preclude  SARS-Cov-2 infection and should not be used as the sole basis for treatment or other patient management decisions. A negative result may occur with  improper specimen collection/handling, submission of specimen other than nasopharyngeal swab, presence of viral mutation(s) within the areas targeted by this assay, and inadequate number of viral copies(<138 copies/mL). A negative result must be combined with clinical observations, patient history, and epidemiological information. The expected result is Negative.  Fact Sheet for Patients:  BloggerCourse.com  Fact Sheet for Healthcare Providers:  SeriousBroker.it  This test is no t yet approved or cleared by the [place] FDA and  has been authorized for detection and/or diagnosis of SARS-CoV-2 by FDA under an Emergency Use Authorization (EUA). This EUA will remain  in effect (meaning this test can be used) for the duration of the COVID-19 declaration under Section 564(b)(1) of the Act, 21 U.S.C.section  360bbb-3(b)(1), unless the authorization is terminated  or revoked sooner.       Influenza A by PCR NEGATIVE NEGATIVE Final   Influenza B by PCR NEGATIVE NEGATIVE Final    Comment: (NOTE) The Xpert Xpress SARS-CoV-2/FLU/RSV plus assay is intended as an aid in the diagnosis of influenza from Nasopharyngeal swab specimens and should not be used as a sole basis for treatment. Nasal washings and aspirates are unacceptable for Xpert Xpress SARS-CoV-2/FLU/RSV testing.  Fact Sheet for Patients: BloggerCourse.com  Fact Sheet for Healthcare Providers: SeriousBroker.it  This test is not yet approved or cleared by the [place] FDA and has been authorized for detection and/or diagnosis of SARS-CoV-2 by FDA under an Emergency Use Authorization (EUA). This EUA will remain in effect (meaning this test can be used) for the duration of  the COVID-19 declaration under Section 564(b)(1) of the Act, 21 U.S.C. section 360bbb-3(b)(1), unless the authorization is terminated or revoked.  Performed at [facility], [address] [place], [place] [address]       Radiology Studies: DG Chest Portable 1 View  Result Date: 12/02/2020 CLINICAL DATA:  Chest pain and shortness of breath EXAM: PORTABLE CHEST 1 VIEW COMPARISON:  12/03/2012 FINDINGS: The heart size and mediastinal contours are within normal limits. Both lungs are clear. The visualized skeletal structures are unremarkable. IMPRESSION: No active disease. Electronically Signed   By: Judie Petit.  Shick M.D.   On: 12/02/2020 15:00     Scheduled Meds: . buPROPion  150 mg Oral Daily  . enoxaparin (LOVENOX) injection  40 mg Subcutaneous Q24H  . nitrofurantoin (macrocrystal-monohydrate)  100 mg Oral QHS  . topiramate  50 mg Oral BID   Continuous Infusions:   LOS: 0 days   Time Spent in minutes   45 minutes  Aleksis Jiggetts D.O. on 12/03/2020 at 1:05 PM  Between 7am to 7pm - Please see pager noted on [email or web address]  After 7pm go to [email or web address]  And look for the night coverage person covering for me after hours  [facility] Office  [phone]

## 2020-12-03 NOTE — H&P (Signed)
History and Physical    Carrie Richards:[id number] DOB: 1971-02-13 DOA: 12/02/2020  PCP: Orland Mustard, MD  Patient coming from: Home.  Chief Complaint: Loss of consciousness.  HPI: Carrie Richards is a 50 y.o. female with history of anxiety depression adjustment insomnia recurrent UTI was brought to the ER after patient had a syncopal episode.  Patient states she has had at least 3 syncopal episode over the last 1 week.  The last one was 2 days ago when patient was in the bathtub taking a bath.  Prior to the episode patient gets tachycardic palpitations shortness of breath dizzy and loses consciousness for a few seconds.  Has not had any incontinence of urine or bowel.  During the first episode patient was trying to bend and pick something when she lost consciousness.  Patient has had recent surgery for eyelids about a month ago.  Patient states she wears Apple Watch and it at times did indicate tachycardia in the watch.  ED Course: In the ER initially patient was tachycardic which improved without any intervention.  QTC is 444 ms labs are largely unremarkable including D-dimer being negative TSH is pending.  Covid test negative patient admitted for further observation.  Review of Systems: As per HPI, rest all negative.   Past Medical History:  Diagnosis Date  . Anemia   . Anxiety   . Reactive depression 06/16/2017    Past Surgical History:  Procedure Laterality Date  . ABDOMINAL HYSTERECTOMY    . CESAREAN SECTION    . CHOLECYSTECTOMY    . WRIST SURGERY Right      reports that she has never smoked. She has never used smokeless tobacco. She reports current alcohol use of about 2.0 standard drinks of alcohol per week. She reports that she does not use drugs.  No Known Allergies  Family History  Problem Relation Age of Onset  . Diabetes Mother   . Cancer Mother   . Breast cancer Mother   . Ovarian cancer Mother   . Melanoma Father   . Hypertension Father   . Hyperlipidemia  Father   . Hypertension Sister   . Depression Sister   . Hypertension Sister   . Heart disease Sister   . Depression Sister   . Heart attack Maternal Grandmother   . Breast cancer Paternal Grandmother     Prior to Admission medications   Medication Sig Start Date End Date Taking? Authorizing Provider  buPROPion (WELLBUTRIN XL) 150 MG 24 hr tablet Take 1 tablet (150 mg total) by mouth daily. 03/16/19   [name], DO  cyclobenzaprine (FLEXERIL) 10 MG tablet Take 1 tablet (10 mg total) by mouth 3 (three) times daily as needed for muscle spasms. 01/22/20   Orland Mustard, MD  LORazepam (ATIVAN) 1 MG tablet Take 0.5-1 tablets (0.5-1 mg total) by mouth at bedtime as needed. for sleep 01/22/20   Orland Mustard, MD  mupirocin ointment (BACTROBAN) 2 % mupirocin 2 % topical ointment    [provider]  nitrofurantoin, macrocrystal-monohydrate, (MACROBID) 100 MG capsule TAKE 1 CAPSULE BY MOUTH AT BEDTIME. 09/15/20   Orland Mustard, MD  phentermine (ADIPEX-P) 37.5 MG tablet TAKE 1 TABLET BY MOUTH DAILY BEFORE BREAKFAST 06/17/20   Orland Mustard, MD  topiramate (TOPAMAX) 50 MG tablet Take 1 tablet (50 mg total) by mouth 2 (two) times daily. 08/03/20   Orland Mustard, MD    Physical Exam: Constitutional: Moderately built and nourished. Vitals:   12/02/20 2150 12/02/20 2158 12/02/20 2248  12/03/20 0023  BP: (!) 115/94  130/87 132/83  Pulse: 99  91 84  Resp: (!) 21  20 18   Temp:  98.4 F (36.9 C) (!) 97.5 F (36.4 C) 97.7 F (36.5 C)  TempSrc:  Oral Oral Oral  SpO2: 98%  96% 98%  Weight:   79.4 kg   Height:   5\' 2"  (1.575 m)    Eyes: Anicteric no pallor. ENMT: No discharge from the ears eyes nose or mouth. Neck: No mass felt.  No neck rigidity. Respiratory: No rhonchi or crepitations. Cardiovascular: S1-S2 heard. Abdomen: Soft nontender bowel sounds present. Musculoskeletal: No edema. Skin: No rash. Neurologic: Alert awake oriented to time place and person.  Moves all  extremities. Psychiatric: Appears normal.  Normal affect.   Labs on Admission: I have personally reviewed following labs and imaging studies  CBC: Recent Labs  Lab 12/02/20 1356  WBC 8.6  HGB 15.0  HCT 45.9  MCV 97.0  PLT 367   Basic Metabolic Panel: Recent Labs  Lab 12/02/20 1356  NA 140  K 4.1  CL 104  CO2 25  GLUCOSE 125*  BUN 18  CREATININE 0.86  CALCIUM 9.7   GFR: Estimated Creatinine Clearance: 77.2 mL/min (by C-G formula based on SCr of 0.86 mg/dL). Liver Function Tests: No results for input(s): AST, ALT, ALKPHOS, BILITOT, PROT, ALBUMIN in the last 168 hours. No results for input(s): LIPASE, AMYLASE in the last 168 hours. No results for input(s): AMMONIA in the last 168 hours. Coagulation Profile: No results for input(s): INR, PROTIME in the last 168 hours. Cardiac Enzymes: No results for input(s): CKTOTAL, CKMB, CKMBINDEX, TROPONINI in the last 168 hours. BNP (last 3 results) No results for input(s): PROBNP in the last 8760 hours. HbA1C: No results for input(s): HGBA1C in the last 72 hours. CBG: No results for input(s): GLUCAP in the last 168 hours. Lipid Profile: No results for input(s): CHOL, HDL, LDLCALC, TRIG, CHOLHDL, LDLDIRECT in the last 72 hours. Thyroid Function Tests: No results for input(s): TSH, T4TOTAL, FREET4, T3FREE, THYROIDAB in the last 72 hours. Anemia Panel: No results for input(s): VITAMINB12, FOLATE, FERRITIN, TIBC, IRON, RETICCTPCT in the last 72 hours. Urine analysis:    Component Value Date/Time   BILIRUBINUR Small 02/03/2018 1440   PROTEINUR Trace 02/03/2018 1440   UROBILINOGEN 4.0 (A) 02/03/2018 1440   NITRITE Positive 02/03/2018 1440   LEUKOCYTESUR Large (3+) (A) 02/03/2018 1440   Sepsis Labs: @LABRCNTIP (procalcitonin:4,lacticidven:4) ) Recent Results (from the past 240 hour(s))  Resp Panel by RT-PCR (Flu A&B, Covid) Nasopharyngeal Swab     Status: None   Collection Time: 12/02/20  5:25 PM   Specimen: Nasopharyngeal  Swab; Nasopharyngeal(NP) swabs in vial transport medium  Result Value Ref Range Status   SARS Coronavirus 2 by RT PCR NEGATIVE NEGATIVE Final    Comment: (NOTE) SARS-CoV-2 target nucleic acids are NOT DETECTED.  The SARS-CoV-2 RNA is generally detectable in upper respiratory specimens during the acute phase of infection. The lowest concentration of SARS-CoV-2 viral copies this assay can detect is 138 copies/mL. A negative result does not preclude SARS-Cov-2 infection and should not be used as the sole basis for treatment or other patient management decisions. A negative result may occur with  improper specimen collection/handling, submission of specimen other than nasopharyngeal swab, presence of viral mutation(s) within the areas targeted by this assay, and inadequate number of viral copies(<138 copies/mL). A negative result must be combined with clinical observations, patient history, and epidemiological information. The expected result is  Negative.  Fact Sheet for Patients:  BloggerCourse.com  Fact Sheet for Healthcare Providers:  SeriousBroker.it  This test is no t yet approved or cleared by the [place] FDA and  has been authorized for detection and/or diagnosis of SARS-CoV-2 by FDA under an Emergency Use Authorization (EUA). This EUA will remain  in effect (meaning this test can be used) for the duration of the COVID-19 declaration under Section 564(b)(1) of the Act, 21 U.S.C.section 360bbb-3(b)(1), unless the authorization is terminated  or revoked sooner.       Influenza A by PCR NEGATIVE NEGATIVE Final   Influenza B by PCR NEGATIVE NEGATIVE Final    Comment: (NOTE) The Xpert Xpress SARS-CoV-2/FLU/RSV plus assay is intended as an aid in the diagnosis of influenza from Nasopharyngeal swab specimens and should not be used as a sole basis for treatment. Nasal washings and aspirates are unacceptable for Xpert Xpress  SARS-CoV-2/FLU/RSV testing.  Fact Sheet for Patients: BloggerCourse.com  Fact Sheet for Healthcare Providers: SeriousBroker.it  This test is not yet approved or cleared by the [place] FDA and has been authorized for detection and/or diagnosis of SARS-CoV-2 by FDA under an Emergency Use Authorization (EUA). This EUA will remain in effect (meaning this test can be used) for the duration of the COVID-19 declaration under Section 564(b)(1) of the Act, 21 U.S.C. section 360bbb-3(b)(1), unless the authorization is terminated or revoked.  Performed at [facility], [address] [place], [place] [address]      Radiological Exams on Admission: DG Chest Portable 1 View  Result Date: 12/02/2020 CLINICAL DATA:  Chest pain and shortness of breath EXAM: PORTABLE CHEST 1 VIEW COMPARISON:  12/03/2012 FINDINGS: The heart size and mediastinal contours are within normal limits. Both lungs are clear. The visualized skeletal structures are unremarkable. IMPRESSION: No active disease. Electronically Signed   By: Judie Petit.  Shick M.D.   On: 12/02/2020 15:00    EKG: Independently reviewed.  Initial EKG was sinus tachycardia.  Repeat monitor shows normal sinus rhythm QTC 444 ms.  Assessment/Plan Principal Problem:   Syncope Active Problems:   Tachycardia    1. Syncope cause not clear.  Has some dizziness and shortness of breath and palpitation prior to the episodes.  Will check TSH monitoring telemetry.  D-dimer was negative check 2D echo.  Check orthostatics. 2. History of adjustment insomnia on lorazepam. 3. History of recurrent UTI on Macrodantin. 4. History of obesity on Topamax.   DVT prophylaxis: Lovenox. Code Status: Full code. Family Communication: Discussed with patient. Disposition Plan: Home. Consults called: None. Admission status: Observation.   Eduard Clos MD [facility] Pager [phone].  If  7PM-7AM, please contact night-coverage [email or web address] Password [email or web address]  12/03/2020, 1:12 AM

## 2020-12-03 NOTE — Progress Notes (Signed)
  Echocardiogram 2D Echocardiogram has been performed.  Carrie Richards 12/03/2020, 9:33 AM

## 2020-12-04 ENCOUNTER — Observation Stay (HOSPITAL_BASED_OUTPATIENT_CLINIC_OR_DEPARTMENT_OTHER): Payer: No Typology Code available for payment source

## 2020-12-04 DIAGNOSIS — R000 Tachycardia, unspecified: Secondary | ICD-10-CM

## 2020-12-04 DIAGNOSIS — R55 Syncope and collapse: Secondary | ICD-10-CM

## 2020-12-04 LAB — URINALYSIS, ROUTINE W REFLEX MICROSCOPIC
Bilirubin Urine: NEGATIVE
Glucose, UA: NEGATIVE mg/dL
Hgb urine dipstick: NEGATIVE
Ketones, ur: NEGATIVE mg/dL
Leukocytes,Ua: NEGATIVE
Nitrite: NEGATIVE
Protein, ur: NEGATIVE mg/dL
Specific Gravity, Urine: 1.013 (ref 1.005–1.030)
pH: 7 (ref 5.0–8.0)

## 2020-12-04 NOTE — Discharge Instructions (Signed)
     Syncope Syncope is when you pass out (faint) for a short time. It is caused by a sudden decrease in blood flow to the brain. Signs that you may be about to pass out include:  Feeling dizzy or light-headed.  Feeling sick to your stomach (nauseous).  Seeing all white or all black.  Having cold, clammy skin. If you pass out, get help right away. Call your local emergency services (911 in the [place] Do not drive yourself to the hospital. Follow these instructions at home: Watch for any changes in your symptoms. Take these actions to stay safe and help with your symptoms: Lifestyle  Do not drive, use machinery, or play sports until your doctor says it is okay.  Do not drink alcohol.  Do not use any products that contain nicotine or tobacco, such as cigarettes and e-cigarettes. If you need help quitting, ask your doctor.  Drink enough fluid to keep your pee (urine) pale yellow. General instructions  Take over-the-counter and prescription medicines only as told by your doctor.  If you are taking blood pressure or heart medicine, sit up and stand up slowly. Spend a few minutes getting ready to sit and then stand. This can help you feel less dizzy.  Have someone stay with you until you feel stable.  If you start to feel like you might pass out, lie down right away and raise (elevate) your feet above the level of your heart. Breathe deeply and steadily. Wait until all of the symptoms are gone.  Keep all follow-up visits as told by your doctor. This is important. Get help right away if:  You have a very bad headache.  You pass out once or more than once.  You have pain in your chest, belly, or back.  You have a very fast or uneven heartbeat (palpitations).  It hurts to breathe.  You are bleeding from your mouth or your bottom (rectum).  You have black or tarry poop (stool).  You have jerky movements that you cannot control (seizure).  You are confused.  You have  trouble walking.  You are very weak.  You have vision problems. These symptoms may be an emergency. Do not wait to see if the symptoms will go away. Get medical help right away. Call your local emergency services (911 in the [place] Do not drive yourself to the hospital. Summary  Syncope is when you pass out (faint) for a short time. It is caused by a sudden decrease in blood flow to the brain.  Signs that you may be about to faint include feeling dizzy, light-headed, or sick to your stomach, seeing all white or all black, or having cold, clammy skin.  If you start to feel like you might pass out, lie down right away and raise (elevate) your feet above the level of your heart. Breathe deeply and steadily. Wait until all of the symptoms are gone. This information is not intended to replace advice given to you by your health care provider. Make sure you discuss any questions you have with your health care provider. Document Revised: 12/25/2017 Document Reviewed: 12/25/2017 Elsevier Patient Education  2020 [employer]  

## 2020-12-04 NOTE — Plan of Care (Signed)
  Problem: Education: Goal: Knowledge of General Education information will improve Description: Including pain rating scale, medication(s)/side effects and non-pharmacologic comfort measures Outcome: Completed/Met   Problem: Health Behavior/Discharge Planning: Goal: Ability to manage health-related needs will improve Outcome: Completed/Met   Problem: Clinical Measurements: Goal: Ability to maintain clinical measurements within normal limits will improve Outcome: Completed/Met Goal: Will remain free from infection Outcome: Completed/Met Goal: Diagnostic test results will improve Outcome: Completed/Met Goal: Respiratory complications will improve Outcome: Completed/Met Goal: Cardiovascular complication will be avoided Outcome: Completed/Met   Problem: Activity: Goal: Risk for activity intolerance will decrease Outcome: Completed/Met   Problem: Coping: Goal: Level of anxiety will decrease Outcome: Completed/Met   Problem: Elimination: Goal: Will not experience complications related to bowel motility Outcome: Completed/Met Goal: Will not experience complications related to urinary retention Outcome: Completed/Met   Problem: Pain Managment: Goal: General experience of comfort will improve Outcome: Completed/Met   Problem: Safety: Goal: Ability to remain free from injury will improve Outcome: Completed/Met   Problem: Skin Integrity: Goal: Risk for impaired skin integrity will decrease Outcome: Completed/Met   

## 2020-12-04 NOTE — Discharge Summary (Signed)
Physician Discharge Summary  Carrie Richards [id number] DOB: [birth year] DOA: 12/02/2020  PCP: Orma Flaming, MD  Admit date: 12/02/2020 Discharge date: 12/04/2020  Time spent: 45 minutes  Recommendations for Outpatient Follow-up:  Patient will be discharged to home.  Patient will need to follow up with primary care provider within one week of discharge.  Follow up with cardiology. Patient should continue medications as prescribed.  Patient should follow a regular diet.   Discharge Diagnoses:  Syncope Insomnia/anxiety/Depression Recent history of UTI Obesity  Discharge Condition: Stable  Diet recommendation: Regular   Filed Weights   12/02/20 2248 12/03/20 0302 12/04/20 0448  Weight: 79.4 kg 78.8 kg 78.7 kg    History of present illness:  on 12/03/2020 by Dr. Rulon Sera C Bynumis a 50 y.o.femalewithhistory of anxiety depression adjustment insomnia recurrent UTI was brought to the ER after patient had a syncopal episode. Patient states she has had at least 3 syncopal episode over the last 1 week. The last one was 2 days ago when patient was in the bathtub taking a bath. Prior to the episode patient gets tachycardic palpitations shortness of breath dizzy and loses consciousness for a few seconds. Has not had any incontinence of urine or bowel. During the first episode patient was trying to bend and pick something when she lost consciousness. Patient has had recent surgery for eyelids about a month ago. Patient states she wears Apple Watch and it at times did indicate tachycardia in the watch.  Hospital Course:  Syncope -Unclear etiology -Patient stated that she had dizziness and shortness of breath along with palpitations prior to her episodes -TSH 2.38 -D-dimer was unremarkable -Echocardiogram EF 60-65%, no regional wall motion abnormalities.  The left ventricular diastolic parameters were normal.  RV systolic function normal. -Carotid Doppler  unremarkable -Orthostatic vitals were unremarkable x 2 -Discussed with cardiology, Dr. Harrington Challenger, via phone.  Likely needs an event monitor, will arrange with the office.  Recommended obtaining repeat EKG (unremarkable) as well as UA to look at specific gravity (which was within normal range) -PT evaluated patient, no further needs  Insomnia/anxiety/Depression -Continue lorazepam PRN and Wellbutrin, Topamax  Recent history of UTI -Continue Macrodantin  Obesity -Patient tells me she is no longer on phentermine and has not taken this medication in over a year  [facility], via phone  Procedures  Echocardiogram  Discharge Exam: Vitals:   12/04/20 0444 12/04/20 1149  BP: 109/68 114/81  Pulse: 82 78  Resp: 18 17  Temp: 98 F (36.7 C) 98.3 F (36.8 C)  SpO2: 97% 96%     General: Well developed, well nourished, NAD, appears stated age  HEENT: NCAT, mucous membranes moist.  Cardiovascular: S1 S2 auscultated, RRR  Respiratory: Clear to auscultation bilaterally with equal chest rise  Abdomen: Soft, nontender, nondistended, + bowel sounds  Extremities: warm dry without cyanosis clubbing or edema  Neuro: AAOx3, nonfocal  Psych: Appropriate mood and affect, pleasant   Discharge Instructions Discharge Instructions    Activity as tolerated - No restrictions   Complete by: As directed    Diet - low sodium heart healthy   Complete by: As directed    Discharge instructions   Complete by: As directed    Patient will be discharged to home.  Patient will need to follow up with primary care provider within one week of discharge.  Follow up with cardiology. Patient should continue medications as prescribed.  Patient should follow a regular diet.  Do not drive until cleared  by your medical doctor or physician.     Allergies as of 12/04/2020   No Known Allergies     Medication List    STOP taking these medications   buPROPion 150 MG 24 hr tablet Commonly known as:  WELLBUTRIN XL   phentermine 37.5 MG tablet Commonly known as: ADIPEX-P     TAKE these medications   cyclobenzaprine 10 MG tablet Commonly known as: FLEXERIL Take 1 tablet (10 mg total) by mouth 3 (three) times daily as needed for muscle spasms.   HYDROcodone-acetaminophen 5-325 MG tablet Commonly known as: NORCO/VICODIN Take 1 tablet by mouth 3 (three) times daily as needed for moderate pain.   LORazepam 1 MG tablet Commonly known as: ATIVAN Take 0.5-1 tablets (0.5-1 mg total) by mouth at bedtime as needed. for sleep   neomycin-polymyxin-dexameth 0.1 % Oint Commonly known as: MAXITROL Place 1 application into both eyes daily.   nitrofurantoin (macrocrystal-monohydrate) 100 MG capsule Commonly known as: MACROBID TAKE 1 CAPSULE BY MOUTH AT BEDTIME. What changed: when to take this   topiramate 50 MG tablet Commonly known as: TOPAMAX Take 1 tablet (50 mg total) by mouth 2 (two) times daily.      No Known Allergies  Follow-up Information    Orma Flaming, MD. Schedule an appointment as soon as possible for a visit in 1 week(s).   Specialty: Family Medicine Why: Hospital follow-up Contact information: [place] [address] [phone]        Fay Records, MD. Schedule an appointment as soon as possible for a visit in 1 week(s).   Specialty: Cardiology Why: Hospital follow up Contact information: [name] [address] [phone]                The results of significant diagnostics from this hospitalization (including imaging, microbiology, ancillary and laboratory) are listed below for reference.    Significant Diagnostic Studies: DG Chest Portable 1 View  Result Date: 12/02/2020 CLINICAL DATA:  Chest pain and shortness of breath EXAM: PORTABLE CHEST 1 VIEW COMPARISON:  12/03/2012 FINDINGS: The heart size and mediastinal contours are within normal limits. Both lungs are clear. The visualized skeletal  structures are unremarkable. IMPRESSION: No active disease. Electronically Signed   By: Jerilynn Mages.  Shick M.D.   On: 12/02/2020 15:00   ECHOCARDIOGRAM COMPLETE  Result Date: 12/03/2020    ECHOCARDIOGRAM REPORT   Patient Name:   Carrie Richards Date of Exam: 12/03/2020 Medical Rec #:  [id number]     Height:       62.0 in Accession #:    [id number]    Weight:       173.8 lb Date of Birth:  01-13-1971      BSA:          1.801 m Patient Age:    72 years      BP:           102/76 mmHg Patient Gender: F             HR:           81 bpm. Exam Location:  Inpatient Procedure: 2D Echo, Cardiac Doppler and Color Doppler Indications:    Syncope  History:        Patient has no prior history of Echocardiogram examinations.                 Signs/Symptoms:Syncope. GERD.  Sonographer:    Clayton Lefort RDCS (AE) Referring Phys: [phone]  Physician Discharge Summary  Carrie Richards [id number] DOB: [birth year] DOA: 12/02/2020  PCP: Orma Flaming, MD  Admit date: 12/02/2020 Discharge date: 12/04/2020  Time spent: 45 minutes  Recommendations for Outpatient Follow-up:  Patient will be discharged to home.  Patient will need to follow up with primary care provider within one week of discharge.  Follow up with cardiology. Patient should continue medications as prescribed.  Patient should follow a regular diet.   Discharge Diagnoses:  Syncope Insomnia/anxiety/Depression Recent history of UTI Obesity  Discharge Condition: Stable  Diet recommendation: Regular   Filed Weights   12/02/20 2248 12/03/20 0302 12/04/20 0448  Weight: 79.4 kg 78.8 kg 78.7 kg    History of present illness:  on 12/03/2020 by Dr. Rulon Sera C Bynumis a 50 y.o.femalewithhistory of anxiety depression adjustment insomnia recurrent UTI was brought to the ER after patient had a syncopal episode. Patient states she has had at least 3 syncopal episode over the last 1 week. The last one was 2 days ago when patient was in the bathtub taking a bath. Prior to the episode patient gets tachycardic palpitations shortness of breath dizzy and loses consciousness for a few seconds. Has not had any incontinence of urine or bowel. During the first episode patient was trying to bend and pick something when she lost consciousness. Patient has had recent surgery for eyelids about a month ago. Patient states she wears Apple Watch and it at times did indicate tachycardia in the watch.  Hospital Course:  Syncope -Unclear etiology -Patient stated that she had dizziness and shortness of breath along with palpitations prior to her episodes -TSH 2.38 -D-dimer was unremarkable -Echocardiogram EF 60-65%, no regional wall motion abnormalities.  The left ventricular diastolic parameters were normal.  RV systolic function normal. -Carotid Doppler  unremarkable -Orthostatic vitals were unremarkable x 2 -Discussed with cardiology, Dr. Harrington Challenger, via phone.  Likely needs an event monitor, will arrange with the office.  Recommended obtaining repeat EKG (unremarkable) as well as UA to look at specific gravity (which was within normal range) -PT evaluated patient, no further needs  Insomnia/anxiety/Depression -Continue lorazepam PRN and Wellbutrin, Topamax  Recent history of UTI -Continue Macrodantin  Obesity -Patient tells me she is no longer on phentermine and has not taken this medication in over a year  [facility], via phone  Procedures  Echocardiogram  Discharge Exam: Vitals:   12/04/20 0444 12/04/20 1149  BP: 109/68 114/81  Pulse: 82 78  Resp: 18 17  Temp: 98 F (36.7 C) 98.3 F (36.8 C)  SpO2: 97% 96%     General: Well developed, well nourished, NAD, appears stated age  HEENT: NCAT, mucous membranes moist.  Cardiovascular: S1 S2 auscultated, RRR  Respiratory: Clear to auscultation bilaterally with equal chest rise  Abdomen: Soft, nontender, nondistended, + bowel sounds  Extremities: warm dry without cyanosis clubbing or edema  Neuro: AAOx3, nonfocal  Psych: Appropriate mood and affect, pleasant   Discharge Instructions Discharge Instructions    Activity as tolerated - No restrictions   Complete by: As directed    Diet - low sodium heart healthy   Complete by: As directed    Discharge instructions   Complete by: As directed    Patient will be discharged to home.  Patient will need to follow up with primary care provider within one week of discharge.  Follow up with cardiology. Patient should continue medications as prescribed.  Patient should follow a regular diet.  Do not drive until cleared  by your medical doctor or physician.     Allergies as of 12/04/2020   No Known Allergies     Medication List    STOP taking these medications   buPROPion 150 MG 24 hr tablet Commonly known as:  WELLBUTRIN XL   phentermine 37.5 MG tablet Commonly known as: ADIPEX-P     TAKE these medications   cyclobenzaprine 10 MG tablet Commonly known as: FLEXERIL Take 1 tablet (10 mg total) by mouth 3 (three) times daily as needed for muscle spasms.   HYDROcodone-acetaminophen 5-325 MG tablet Commonly known as: NORCO/VICODIN Take 1 tablet by mouth 3 (three) times daily as needed for moderate pain.   LORazepam 1 MG tablet Commonly known as: ATIVAN Take 0.5-1 tablets (0.5-1 mg total) by mouth at bedtime as needed. for sleep   neomycin-polymyxin-dexameth 0.1 % Oint Commonly known as: MAXITROL Place 1 application into both eyes daily.   nitrofurantoin (macrocrystal-monohydrate) 100 MG capsule Commonly known as: MACROBID TAKE 1 CAPSULE BY MOUTH AT BEDTIME. What changed: when to take this   topiramate 50 MG tablet Commonly known as: TOPAMAX Take 1 tablet (50 mg total) by mouth 2 (two) times daily.      No Known Allergies  Follow-up Information    Orma Flaming, MD. Schedule an appointment as soon as possible for a visit in 1 week(s).   Specialty: Family Medicine Why: Hospital follow-up Contact information: [place] [address] [phone]        Fay Records, MD. Schedule an appointment as soon as possible for a visit in 1 week(s).   Specialty: Cardiology Why: Hospital follow up Contact information: [name] [address] [phone]                The results of significant diagnostics from this hospitalization (including imaging, microbiology, ancillary and laboratory) are listed below for reference.    Significant Diagnostic Studies: DG Chest Portable 1 View  Result Date: 12/02/2020 CLINICAL DATA:  Chest pain and shortness of breath EXAM: PORTABLE CHEST 1 VIEW COMPARISON:  12/03/2012 FINDINGS: The heart size and mediastinal contours are within normal limits. Both lungs are clear. The visualized skeletal  structures are unremarkable. IMPRESSION: No active disease. Electronically Signed   By: Jerilynn Mages.  Shick M.D.   On: 12/02/2020 15:00   ECHOCARDIOGRAM COMPLETE  Result Date: 12/03/2020    ECHOCARDIOGRAM REPORT   Patient Name:   Carrie Richards Date of Exam: 12/03/2020 Medical Rec #:  [id number]     Height:       62.0 in Accession #:    [id number]    Weight:       173.8 lb Date of Birth:  01-13-1971      BSA:          1.801 m Patient Age:    72 years      BP:           102/76 mmHg Patient Gender: F             HR:           81 bpm. Exam Location:  Inpatient Procedure: 2D Echo, Cardiac Doppler and Color Doppler Indications:    Syncope  History:        Patient has no prior history of Echocardiogram examinations.                 Signs/Symptoms:Syncope. GERD.  Sonographer:    Clayton Lefort RDCS (AE) Referring Phys: [phone]  Physician Discharge Summary  Carrie Richards [id number] DOB: [birth year] DOA: 12/02/2020  PCP: Orma Flaming, MD  Admit date: 12/02/2020 Discharge date: 12/04/2020  Time spent: 45 minutes  Recommendations for Outpatient Follow-up:  Patient will be discharged to home.  Patient will need to follow up with primary care provider within one week of discharge.  Follow up with cardiology. Patient should continue medications as prescribed.  Patient should follow a regular diet.   Discharge Diagnoses:  Syncope Insomnia/anxiety/Depression Recent history of UTI Obesity  Discharge Condition: Stable  Diet recommendation: Regular   Filed Weights   12/02/20 2248 12/03/20 0302 12/04/20 0448  Weight: 79.4 kg 78.8 kg 78.7 kg    History of present illness:  on 12/03/2020 by Dr. Rulon Sera C Bynumis a 50 y.o.femalewithhistory of anxiety depression adjustment insomnia recurrent UTI was brought to the ER after patient had a syncopal episode. Patient states she has had at least 3 syncopal episode over the last 1 week. The last one was 2 days ago when patient was in the bathtub taking a bath. Prior to the episode patient gets tachycardic palpitations shortness of breath dizzy and loses consciousness for a few seconds. Has not had any incontinence of urine or bowel. During the first episode patient was trying to bend and pick something when she lost consciousness. Patient has had recent surgery for eyelids about a month ago. Patient states she wears Apple Watch and it at times did indicate tachycardia in the watch.  Hospital Course:  Syncope -Unclear etiology -Patient stated that she had dizziness and shortness of breath along with palpitations prior to her episodes -TSH 2.38 -D-dimer was unremarkable -Echocardiogram EF 60-65%, no regional wall motion abnormalities.  The left ventricular diastolic parameters were normal.  RV systolic function normal. -Carotid Doppler  unremarkable -Orthostatic vitals were unremarkable x 2 -Discussed with cardiology, Dr. Harrington Challenger, via phone.  Likely needs an event monitor, will arrange with the office.  Recommended obtaining repeat EKG (unremarkable) as well as UA to look at specific gravity (which was within normal range) -PT evaluated patient, no further needs  Insomnia/anxiety/Depression -Continue lorazepam PRN and Wellbutrin, Topamax  Recent history of UTI -Continue Macrodantin  Obesity -Patient tells me she is no longer on phentermine and has not taken this medication in over a year  [facility], via phone  Procedures  Echocardiogram  Discharge Exam: Vitals:   12/04/20 0444 12/04/20 1149  BP: 109/68 114/81  Pulse: 82 78  Resp: 18 17  Temp: 98 F (36.7 C) 98.3 F (36.8 C)  SpO2: 97% 96%     General: Well developed, well nourished, NAD, appears stated age  HEENT: NCAT, mucous membranes moist.  Cardiovascular: S1 S2 auscultated, RRR  Respiratory: Clear to auscultation bilaterally with equal chest rise  Abdomen: Soft, nontender, nondistended, + bowel sounds  Extremities: warm dry without cyanosis clubbing or edema  Neuro: AAOx3, nonfocal  Psych: Appropriate mood and affect, pleasant   Discharge Instructions Discharge Instructions    Activity as tolerated - No restrictions   Complete by: As directed    Diet - low sodium heart healthy   Complete by: As directed    Discharge instructions   Complete by: As directed    Patient will be discharged to home.  Patient will need to follow up with primary care provider within one week of discharge.  Follow up with cardiology. Patient should continue medications as prescribed.  Patient should follow a regular diet.  Do not drive until cleared  Physician Discharge Summary  Carrie Richards [id number] DOB: [birth year] DOA: 12/02/2020  PCP: Orma Flaming, MD  Admit date: 12/02/2020 Discharge date: 12/04/2020  Time spent: 45 minutes  Recommendations for Outpatient Follow-up:  Patient will be discharged to home.  Patient will need to follow up with primary care provider within one week of discharge.  Follow up with cardiology. Patient should continue medications as prescribed.  Patient should follow a regular diet.   Discharge Diagnoses:  Syncope Insomnia/anxiety/Depression Recent history of UTI Obesity  Discharge Condition: Stable  Diet recommendation: Regular   Filed Weights   12/02/20 2248 12/03/20 0302 12/04/20 0448  Weight: 79.4 kg 78.8 kg 78.7 kg    History of present illness:  on 12/03/2020 by Dr. Rulon Sera C Bynumis a 50 y.o.femalewithhistory of anxiety depression adjustment insomnia recurrent UTI was brought to the ER after patient had a syncopal episode. Patient states she has had at least 3 syncopal episode over the last 1 week. The last one was 2 days ago when patient was in the bathtub taking a bath. Prior to the episode patient gets tachycardic palpitations shortness of breath dizzy and loses consciousness for a few seconds. Has not had any incontinence of urine or bowel. During the first episode patient was trying to bend and pick something when she lost consciousness. Patient has had recent surgery for eyelids about a month ago. Patient states she wears Apple Watch and it at times did indicate tachycardia in the watch.  Hospital Course:  Syncope -Unclear etiology -Patient stated that she had dizziness and shortness of breath along with palpitations prior to her episodes -TSH 2.38 -D-dimer was unremarkable -Echocardiogram EF 60-65%, no regional wall motion abnormalities.  The left ventricular diastolic parameters were normal.  RV systolic function normal. -Carotid Doppler  unremarkable -Orthostatic vitals were unremarkable x 2 -Discussed with cardiology, Dr. Harrington Challenger, via phone.  Likely needs an event monitor, will arrange with the office.  Recommended obtaining repeat EKG (unremarkable) as well as UA to look at specific gravity (which was within normal range) -PT evaluated patient, no further needs  Insomnia/anxiety/Depression -Continue lorazepam PRN and Wellbutrin, Topamax  Recent history of UTI -Continue Macrodantin  Obesity -Patient tells me she is no longer on phentermine and has not taken this medication in over a year  [facility] Cardiology, via phone  Procedures  Echocardiogram  Discharge Exam: Vitals:   12/04/20 0444 12/04/20 1149  BP: 109/68 114/81  Pulse: 82 78  Resp: 18 17  Temp: 98 F (36.7 C) 98.3 F (36.8 C)  SpO2: 97% 96%     General: Well developed, well nourished, NAD, appears stated age  HEENT: NCAT, mucous membranes moist.  Cardiovascular: S1 S2 auscultated, RRR  Respiratory: Clear to auscultation bilaterally with equal chest rise  Abdomen: Soft, nontender, nondistended, + bowel sounds  Extremities: warm dry without cyanosis clubbing or edema  Neuro: AAOx3, nonfocal  Psych: Appropriate mood and affect, pleasant   Discharge Instructions Discharge Instructions    Activity as tolerated - No restrictions   Complete by: As directed    Diet - low sodium heart healthy   Complete by: As directed    Discharge instructions   Complete by: As directed    Patient will be discharged to home.  Patient will need to follow up with primary care provider within one week of discharge.  Follow up with cardiology. Patient should continue medications as prescribed.  Patient should follow a regular diet.  Do not drive until cleared

## 2020-12-04 NOTE — Evaluation (Signed)
Physical Therapy Evaluation Patient Details Name: Carrie Richards MRN: [id number] DOB: Jan 09, 1971 Today's Date: 12/04/2020   History of Present Illness  50 y.o. female with history of anxiety depression adjustment insomnia recurrent UTI was brought to the ER after patient had a syncopal episode.    Clinical Impression  PT eval complete. Pt is independent with all functional mobility. See below for further details. No further PT intervention indicated. PT signing off.    Follow Up Recommendations No PT follow up    Equipment Recommendations  None recommended by PT    Recommendations for Other Services       Precautions / Restrictions Precautions Precautions: None      Mobility  Bed Mobility Overal bed mobility: Independent                  Transfers Overall transfer level: Independent Equipment used: None                Ambulation/Gait Ambulation/Gait assistance: Independent Gait Distance (Feet): 500 Feet Assistive device: None Gait Pattern/deviations: WFL(Within Functional Limits)   Gait velocity interpretation: >2.62 ft/sec, indicative of community ambulatory    Stairs            Wheelchair Mobility    Modified Rankin (Stroke Patients Only)       Balance Overall balance assessment: No apparent balance deficits (not formally assessed)                                           Pertinent Vitals/Pain Pain Assessment: No/denies pain    Home Living Family/patient expects to be discharged to:: Private residence Living Arrangements: Spouse/significant other;Children (adult children) Available Help at Discharge: Family;Available 24 hours/day Type of Home: House Home Access: Stairs to enter Entrance Stairs-Rails: None Entrance Stairs-Number of Steps: 2 Home Layout: One level Home Equipment: None      Prior Function Level of Independence: Independent         Comments: Works in [employer].     Hand Dominance         Extremity/Trunk Assessment   Upper Extremity Assessment Upper Extremity Assessment: Overall WFL for tasks assessed    Lower Extremity Assessment Lower Extremity Assessment: Overall WFL for tasks assessed    Cervical / Trunk Assessment Cervical / Trunk Assessment: Normal  Communication   Communication: No difficulties  Cognition Arousal/Alertness: Awake/alert Behavior During Therapy: WFL for tasks assessed/performed Overall Cognitive Status: Within Functional Limits for tasks assessed                                        General Comments General comments (skin integrity, edema, etc.): Orthostatic vitals WNL. No c/o dizziness or lightheadedness. Max HR 115 during ambulation.    Exercises     Assessment/Plan    PT Assessment Patent does not need any further PT services  PT Problem List         PT Treatment Interventions      PT Goals (Current goals can be found in the Care Plan section)  Acute Rehab PT Goals Patient Stated Goal: home today PT Goal Formulation: All assessment and education complete, DC therapy    Frequency     Barriers to discharge        Co-evaluation  AM-PAC PT "6 Clicks" Mobility  Outcome Measure Help needed turning from your back to your side while in a flat bed without using bedrails?: None Help needed moving from lying on your back to sitting on the side of a flat bed without using bedrails?: None Help needed moving to and from a bed to a chair (including a wheelchair)?: None Help needed standing up from a chair using your arms (e.g., wheelchair or bedside chair)?: None Help needed to walk in hospital room?: None Help needed climbing 3-5 steps with a railing? : None 6 Click Score: 24    End of Session Equipment Utilized During Treatment: Gait belt Activity Tolerance: Patient tolerated treatment well Patient left: in bed Nurse Communication: Mobility status PT Visit Diagnosis: Difficulty in walking,  not elsewhere classified (R26.2)    Time: 7035-0093 PT Time Calculation (min) (ACUTE ONLY): 14 min   Charges:   PT Evaluation $PT Eval Low Complexity: 1 Low          Aida Raider, PT  Office # [phone] Pager [phone]   Ilda Foil 12/04/2020, 10:58 AM

## 2020-12-04 NOTE — Progress Notes (Signed)
VASCULAR LAB    Carotid duplex has been performed.  See CV proc for preliminary results.   Bernardina Cacho, RVT 12/04/2020, 8:28 AM

## 2020-12-05 ENCOUNTER — Telehealth: Payer: Self-pay

## 2020-12-05 NOTE — Telephone Encounter (Signed)
Patient states that she is needing an ED follow up before the end of the week for her shortness of breath. Okay to use same day for Friday?

## 2020-12-05 NOTE — Telephone Encounter (Signed)
Sure Aw

## 2020-12-06 ENCOUNTER — Encounter: Payer: Self-pay | Admitting: Family Medicine

## 2020-12-06 NOTE — Telephone Encounter (Signed)
LVM for patient to call back. ?

## 2020-12-08 ENCOUNTER — Ambulatory Visit (INDEPENDENT_AMBULATORY_CARE_PROVIDER_SITE_OTHER): Payer: No Typology Code available for payment source

## 2020-12-08 ENCOUNTER — Ambulatory Visit: Payer: No Typology Code available for payment source | Admitting: Internal Medicine

## 2020-12-08 ENCOUNTER — Encounter: Payer: Self-pay | Admitting: Radiology

## 2020-12-08 ENCOUNTER — Other Ambulatory Visit: Payer: Self-pay

## 2020-12-08 ENCOUNTER — Encounter: Payer: Self-pay | Admitting: Internal Medicine

## 2020-12-08 VITALS — BP 114/75 | HR 98 | Ht 62.0 in | Wt 175.2 lb

## 2020-12-08 DIAGNOSIS — R002 Palpitations: Secondary | ICD-10-CM | POA: Diagnosis not present

## 2020-12-08 DIAGNOSIS — I209 Angina pectoris, unspecified: Secondary | ICD-10-CM | POA: Diagnosis not present

## 2020-12-08 DIAGNOSIS — E782 Mixed hyperlipidemia: Secondary | ICD-10-CM | POA: Diagnosis not present

## 2020-12-08 NOTE — Progress Notes (Signed)
Enrolled patient for a 14 day Zio XT Monitor to be mailed to patients home  

## 2020-12-08 NOTE — Progress Notes (Signed)
Cardiology Office Note:    Date:  12/08/2020   ID:  Carrie Richards, DOB 22-Oct-1971, MRN [id number]  PCP:  Orland Mustard, MD  [facility] Cardiologist:  No primary care provider on file.  [facility] Electrophysiologist:  None   CC: Hospital follow up Consulted for the evaluation of chest pain at the behest of Orland Mustard, MD  History of Present Illness:    Carrie Richards is a 50 y.o. female with a hx of syncope and tachycardia NOS, and hepatic steatosis, HLD who presents for chest pain.  Patient notes that she is feeling shortness of breath.  This is accompanied with chest heaviness.  Has had three episodes of near syncope.  This lead to a 12/02/20 EF evaluation and observation.  Received IVF and had a normal echocardiogram.  Since discharge has had no blacking out spells with one episode of breathing issues.  Patients chest heaviness was associated with her difficult breathing.  Feels on her sternum and radiates to her arm.  Unable to catch her breath at random intervals:  Discomfort occurs with sitting in bed watching TV, folding laundry, walking across her kitchen.  No episodes since discharge.  Her chest pressure worse with inspration.  Patient exertion notable for walking up and down stairs and feels no symptoms.  No shortness of breath at rest.  Has some DOE with signifcant exercise, but this feels different.  With the spontaneous symptoms has heart racing. In the last two weeks has had 8 plus times of these racing symptoms.  Separate to this has had episodes of black out.  Last episodes was with a warm bath, before that was with a  positional changes and black outs.  Notes no palpitations or funny heart beats.     Patient reports prior cardiac testing including  Echo.    Past Medical History:  Diagnosis Date  . Anemia   . Anxiety   . Reactive depression 06/16/2017    Past Surgical History:  Procedure Laterality Date  . ABDOMINAL HYSTERECTOMY    . CESAREAN SECTION    .  CHOLECYSTECTOMY    . WRIST SURGERY Right     Current Medications: Current Meds  Medication Sig  . cyclobenzaprine (FLEXERIL) 10 MG tablet Take 1 tablet (10 mg total) by mouth 3 (three) times daily as needed for muscle spasms.  Marland Kitchen HYDROcodone-acetaminophen (NORCO/VICODIN) 5-325 MG tablet Take 1 tablet by mouth 3 (three) times daily as needed for moderate pain.  Marland Kitchen LORazepam (ATIVAN) 1 MG tablet Take 0.5-1 tablets (0.5-1 mg total) by mouth at bedtime as needed. for sleep  . nitrofurantoin, macrocrystal-monohydrate, (MACROBID) 100 MG capsule TAKE 1 CAPSULE BY MOUTH AT BEDTIME.  Marland Kitchen topiramate (TOPAMAX) 50 MG tablet Take 1 tablet (50 mg total) by mouth 2 (two) times daily.     Allergies:   Patient has no known allergies.   Social History   Socioeconomic History  . Marital status: Married    Spouse name: Not on file  . Number of children: Not on file  . Years of education: Not on file  . Highest education level: Not on file  Occupational History  . Occupation: Music therapist: [employer]  Tobacco Use  . Smoking status: Never Smoker  . Smokeless tobacco: Never Used  Vaping Use  . Vaping Use: Never used  Substance and Sexual Activity  . Alcohol use: Yes    Alcohol/week: 2.0 standard drinks    Types: 2 Standard drinks or  equivalent per week  . Drug use: No  . Sexual activity: Yes    Partners: Male    Birth control/protection: Surgical  Other Topics Concern  . Not on file  Social History Narrative  . Not on file   Social Determinants of Health   Financial Resource Strain: Not on file  Food Insecurity: Not on file  Transportation Needs: Not on file  Physical Activity: Not on file  Stress: Not on file  Social Connections: Not on file     Family History: The patient's family history includes Breast cancer in her mother and paternal grandmother; Cancer in her mother; Depression in her sister and sister; Diabetes in her mother; Heart attack in her maternal  grandmother; Heart disease in her sister; Hyperlipidemia in her father; Hypertension in her father, sister, and sister; Melanoma in her father; Ovarian cancer in her mother. History of coronary artery disease notable for grandmother. History of heart failure notable for grandfather. History of arrhythmia notable for multiple aunts, uncles, and sister with atrial fibrillation.   ROS:   Please see the history of present illness.    All other systems reviewed and are negative.  EKGs/Labs/Other Studies Reviewed:    The following studies were reviewed today:  EKG:   12/08/20: SR rate 98 WNL 12/05/2020: sinus tachycardia rate 122  Transthoracic Echocardiogram: Date: 12/08/20 Results: IMPRESSIONS  1. Left ventricular ejection fraction, by estimation, is 60 to 65%. The  left ventricle has normal function. The left ventricle has no regional  wall motion abnormalities. Left ventricular diastolic parameters were  normal.  2. Right ventricular systolic function is normal. The right ventricular  size is normal.  3. The mitral valve is normal in structure. Trivial mitral valve  regurgitation.  4. The aortic valve is normal in structure. Aortic valve regurgitation is  not visualized. No aortic stenosis is present.  5. The inferior vena cava is normal in size with greater than 50%  respiratory variability, suggesting right atrial pressure of 3 mmHg.   Recent Labs: 12/03/2020: BUN 15; Creatinine, Ser 0.78; Hemoglobin 13.6; Platelets 311; Potassium 3.9; Sodium 139; TSH 2.380  Recent Lipid Panel    Component Value Date/Time   CHOL 196 07/01/2019 0845   TRIG 169.0 (H) 07/01/2019 0845   HDL 36.30 (L) 07/01/2019 0845   CHOLHDL 5 07/01/2019 0845   VLDL 33.8 07/01/2019 0845   LDLCALC 126 (H) 07/01/2019 0845     Risk Assessment/Calculations:     The 10-year ASCVD risk score Denman George DC Montez Hageman., et al., 2013) is: 1.5%   Values used to calculate the score:     Age: 2 years     Sex: Female      Is Non-Hispanic African American: No     Diabetic: No     Tobacco smoker: No     Systolic Blood Pressure: 114 mmHg     Is BP treated: No     HDL Cholesterol: 36.3 mg/dL     Total Cholesterol: 196 mg/dL   Physical Exam:    VS:  BP 114/75   Pulse 98   Ht 5\' 2"  (1.575 m)   Wt 175 lb 3.2 oz (79.5 kg)   LMP  (LMP Unknown)   SpO2 97%   BMI 32.04 kg/m     Wt Readings from Last 3 Encounters:  12/08/20 175 lb 3.2 oz (79.5 kg)  12/04/20 173 lb 9.6 oz (78.7 kg)  01/22/20 162 lb 6.4 oz (73.7 kg)    Supine  BP 116/74:  Rate 98 Sitting  Siting 114/75 Rate 99 Standing 109/74 Rate 111 Prolonged Standing  117/78 rate 111  GEN:  Well nourished, well developed in no acute distress HEENT: Normal NECK: No JVD; No carotid bruits LYMPHATICS: No lymphadenopathy CARDIAC: RRR, no murmurs, rubs, gallops RESPIRATORY:  Clear to auscultation without rales, wheezing or rhonchi  ABDOMEN: Soft, non-tender, non-distended MUSCULOSKELETAL:  No edema; No deformity  SKIN: Warm and dry NEUROLOGIC:  Alert and oriented x 3 PSYCHIATRIC:  Normal affect   ASSESSMENT:    1. Angina pectoris (HCC)   2. Palpitations   3. Mixed hyperlipidemia    PLAN:    In order of problems listed above:  Angina Pectoris Occurring only with palpitations - EKG shows  without evidence of accessory pathway, ventricular pacing, digoxin use, LBBB, or baseline ST changes. - Would recommend exercise stress test (NPO at midnight); discussed risks, benefits, and alternatives of the diagnostic procedure including chest pain, arrhythmia, and death.  Patient amenable for testing. - will get 14 day non-live ziopatch  Vasovagal Syncope - stress the importance of salt and water intake - gave education on slow rise, Valsalva maneuver exacerbation, temperature change - discussed muscle contraction and leg crossing - Discussed exercise;  exercise deconditioning exacerbates this issue, optimally benefits from bike/row or  swimming  Hyperlipidemia (mixed) - gave education on dietary changes  2-3 follow up unless new symptoms or abnormal test results warranting change in plan  Would be reasonable for  Virtual Follow up  Would be reasonable for  APP Follow up     Medication Adjustments/Labs and Tests Ordered: Current medicines are reviewed at length with the patient today.  Concerns regarding medicines are outlined above.  Orders Placed This Encounter  Procedures  . Cardiac Stress Test: Informed Consent Details: Physician/Practitioner Attestation; Transcribe to consent form and obtain patient signature  . Exercise Tolerance Test  . LONG TERM MONITOR (3-14 DAYS)  . EKG 12-Lead   No orders of the defined types were placed in this encounter.   Patient Instructions  Medication Instructions:  *If you need a refill on your cardiac medications before your next appointment, please call your pharmacy*  Testing/Procedures: Your physician has requested that you have an exercise tolerance test. For further information please visit [email or web address] Please also follow instruction sheet, as given.  Your physician has recommended that you wear a ZIO Cardiac monitor. Cardiac monitors are medical devices that record the heart's electrical activity. Doctors most often use these monitors to diagnose arrhythmias. Arrhythmias are problems with the speed or rhythm of the heartbeat. The monitor is a small external portable device that you wear on your chest. You can wear one while you do your normal daily activities. This is usually used to diagnose what is causing palpitations/syncope (passing out).  Follow-Up: At [facility], you and your health needs are our priority.  As part of our continuing mission to provide you with exceptional heart care, we have created designated Provider Care Teams.  These Care Teams include your primary Cardiologist (physician) and Advanced Practice Providers (APPs -  Physician Assistants  and Nurse Practitioners) who all work together to provide you with the care you need, when you need it.  We recommend signing up for the patient portal called "MyChart".  Sign up information is provided on this After Visit Summary.  MyChart is used to connect with patients for Virtual Visits (Telemedicine).  Patients are able to view lab/test results, encounter notes, upcoming appointments, etc.  Non-urgent messages can be sent  to your provider as well.   To learn more about what you can do with MyChart, go to [email or web address].    Your next appointment:   Your physician recommends that you schedule a follow-up appointment in: 2 - 3 MONTHS with Dr. Izora Ribas.  The format for your next appointment:   In Person with Riley Lam, MD        Signed, Christell Constant, MD  12/08/2020 3:17 PM    [facility]

## 2020-12-08 NOTE — Patient Instructions (Signed)
Medication Instructions:  *If you need a refill on your cardiac medications before your next appointment, please call your pharmacy*  Testing/Procedures: Your physician has requested that you have an exercise tolerance test. For further information please visit [email or web address]. Please also follow instruction sheet, as given.  Your physician has recommended that you wear a ZIO Cardiac monitor. Cardiac monitors are medical devices that record the heart's electrical activity. Doctors most often use these monitors to diagnose arrhythmias. Arrhythmias are problems with the speed or rhythm of the heartbeat. The monitor is a small external portable device that you wear on your chest. You can wear one while you do your normal daily activities. This is usually used to diagnose what is causing palpitations/syncope (passing out).  Follow-Up: At [facility], you and your health needs are our priority.  As part of our continuing mission to provide you with exceptional heart care, we have created designated Provider Care Teams.  These Care Teams include your primary Cardiologist (physician) and Advanced Practice Providers (APPs -  Physician Assistants and Nurse Practitioners) who all work together to provide you with the care you need, when you need it.  We recommend signing up for the patient portal called "MyChart".  Sign up information is provided on this After Visit Summary.  MyChart is used to connect with patients for Virtual Visits (Telemedicine).  Patients are able to view lab/test results, encounter notes, upcoming appointments, etc.  Non-urgent messages can be sent to your provider as well.   To learn more about what you can do with MyChart, go to [email or web address].    Your next appointment:   Your physician recommends that you schedule a follow-up appointment in: 2 - 3 MONTHS with Dr. Gasper Sells.  The format for your next appointment:   In Person with Rudean Haskell, MD

## 2020-12-09 ENCOUNTER — Encounter: Payer: Self-pay | Admitting: Family Medicine

## 2020-12-09 ENCOUNTER — Ambulatory Visit: Payer: No Typology Code available for payment source | Admitting: Family Medicine

## 2020-12-09 ENCOUNTER — Ambulatory Visit (INDEPENDENT_AMBULATORY_CARE_PROVIDER_SITE_OTHER): Payer: No Typology Code available for payment source | Admitting: Family Medicine

## 2020-12-09 DIAGNOSIS — Z09 Encounter for follow-up examination after completed treatment for conditions other than malignant neoplasm: Secondary | ICD-10-CM

## 2020-12-09 NOTE — Progress Notes (Signed)
Already seen by cardiology with work up in process. She didn't know she would be seen by cards before me and wasn't sure why she had this appointment.  Hospital discharge and stay reviewed. No other things she needs to be seen for at this time. Will f/u as needed. No charge for today.  Orma Flaming, MD [place]

## 2020-12-09 NOTE — Progress Notes (Deleted)
Patient: Carrie Richards MRN: [id number] DOB: [birth year] PCP: Orma Flaming, MD     Subjective:  No chief complaint on file.   HPI: The patient is a 50 y.o. female who presents today for ED follow up for syncope.   Review of Systems  Respiratory: Positive for shortness of breath.   Neurological: Positive for dizziness.    Allergies Patient has No Known Allergies.  Past Medical History Patient  has a past medical history of Anemia, Anxiety, and Reactive depression (06/16/2017).  Surgical History Patient  has a past surgical history that includes Cesarean section; Cholecystectomy; Abdominal hysterectomy; and Wrist surgery (Right).  Family History Pateint's family history includes Breast cancer in her mother and paternal grandmother; Cancer in her mother; Depression in her sister and sister; Diabetes in her mother; Heart attack in her maternal grandmother; Heart disease in her sister; Hyperlipidemia in her father; Hypertension in her father, sister, and sister; Melanoma in her father; Ovarian cancer in her mother.  Social History Patient  reports that she has never smoked. She has never used smokeless tobacco. She reports current alcohol use of about 2.0 standard drinks of alcohol per week. She reports that she does not use drugs.    Objective: There were no vitals filed for this visit.  There is no height or weight on file to calculate BMI.  Physical Exam     Assessment/plan:      No follow-ups on file.     @AWME @ 12/09/2020

## 2020-12-27 ENCOUNTER — Other Ambulatory Visit (HOSPITAL_COMMUNITY)
Admission: RE | Admit: 2020-12-27 | Discharge: 2020-12-27 | Disposition: A | Discharge status: Home / Self Care | Payer: No Typology Code available for payment source | Source: Ambulatory Visit | Attending: Internal Medicine | Admitting: Internal Medicine

## 2020-12-27 DIAGNOSIS — Z01812 Encounter for preprocedural laboratory examination: Secondary | ICD-10-CM | POA: Diagnosis not present

## 2020-12-27 DIAGNOSIS — Z20822 Contact with and (suspected) exposure to covid-19: Secondary | ICD-10-CM | POA: Insufficient documentation

## 2020-12-27 LAB — SARS CORONAVIRUS 2 (TAT 6-24 HRS): SARS Coronavirus 2: NEGATIVE

## 2020-12-29 ENCOUNTER — Ambulatory Visit (INDEPENDENT_AMBULATORY_CARE_PROVIDER_SITE_OTHER): Payer: No Typology Code available for payment source

## 2020-12-29 ENCOUNTER — Other Ambulatory Visit: Payer: Self-pay

## 2020-12-29 DIAGNOSIS — I209 Angina pectoris, unspecified: Secondary | ICD-10-CM | POA: Diagnosis not present

## 2020-12-29 LAB — EXERCISE TOLERANCE TEST
Estimated workload: 7 METS
Exercise duration (min): 6 min
Exercise duration (sec): 0 s
MPHR: 171 {beats}/min
Peak HR: 155 {beats}/min
Percent HR: 90 %
RPE: 15
Rest HR: 83 {beats}/min

## 2020-12-30 ENCOUNTER — Telehealth: Payer: Self-pay

## 2020-12-30 NOTE — Telephone Encounter (Signed)
-----   Message from Werner Lean, MD sent at 12/29/2020  6:22 PM EST ----- Results: Negative Stress Test Plan: Continue current plan  Werner Lean, MD

## 2020-12-30 NOTE — Telephone Encounter (Signed)
The patient has been notified of the result and verbalized understanding.  All questions (if any) were answered. Wilma Flavin, RN 12/30/2020 11:59 AM

## 2021-01-24 ENCOUNTER — Other Ambulatory Visit: Payer: Self-pay

## 2021-01-24 ENCOUNTER — Ambulatory Visit (INDEPENDENT_AMBULATORY_CARE_PROVIDER_SITE_OTHER): Payer: No Typology Code available for payment source | Admitting: Family Medicine

## 2021-01-24 ENCOUNTER — Encounter (INDEPENDENT_AMBULATORY_CARE_PROVIDER_SITE_OTHER): Payer: Self-pay | Admitting: Family Medicine

## 2021-01-24 VITALS — BP 108/72 | HR 77 | Temp 98.4°F | Ht 62.0 in | Wt 176.0 lb

## 2021-01-24 DIAGNOSIS — Z1331 Encounter for screening for depression: Secondary | ICD-10-CM

## 2021-01-24 DIAGNOSIS — Z6832 Body mass index (BMI) 32.0-32.9, adult: Secondary | ICD-10-CM

## 2021-01-24 DIAGNOSIS — E782 Mixed hyperlipidemia: Secondary | ICD-10-CM

## 2021-01-24 DIAGNOSIS — Z9189 Other specified personal risk factors, not elsewhere classified: Secondary | ICD-10-CM | POA: Diagnosis not present

## 2021-01-24 DIAGNOSIS — R5383 Other fatigue: Secondary | ICD-10-CM

## 2021-01-24 DIAGNOSIS — D649 Anemia, unspecified: Secondary | ICD-10-CM

## 2021-01-24 DIAGNOSIS — R739 Hyperglycemia, unspecified: Secondary | ICD-10-CM | POA: Diagnosis not present

## 2021-01-24 DIAGNOSIS — R0602 Shortness of breath: Secondary | ICD-10-CM

## 2021-01-24 DIAGNOSIS — E559 Vitamin D deficiency, unspecified: Secondary | ICD-10-CM

## 2021-01-24 DIAGNOSIS — Z0289 Encounter for other administrative examinations: Secondary | ICD-10-CM

## 2021-01-24 DIAGNOSIS — E669 Obesity, unspecified: Secondary | ICD-10-CM

## 2021-01-25 LAB — CBC WITH DIFFERENTIAL/PLATELET
Basophils Absolute: 0.1 10*3/uL (ref 0.0–0.2)
Basos: 1 %
EOS (ABSOLUTE): 0.2 10*3/uL (ref 0.0–0.4)
Eos: 2 %
Hematocrit: 40.6 % (ref 34.0–46.6)
Hemoglobin: 13.8 g/dL (ref 11.1–15.9)
Immature Grans (Abs): 0 10*3/uL (ref 0.0–0.1)
Immature Granulocytes: 0 %
Lymphocytes Absolute: 2.6 10*3/uL (ref 0.7–3.1)
Lymphs: 35 %
MCH: 32.1 pg (ref 26.6–33.0)
MCHC: 34 g/dL (ref 31.5–35.7)
MCV: 94 fL (ref 79–97)
Monocytes Absolute: 0.4 10*3/uL (ref 0.1–0.9)
Monocytes: 5 %
Neutrophils Absolute: 4.2 10*3/uL (ref 1.4–7.0)
Neutrophils: 57 %
Platelets: 359 10*3/uL (ref 150–450)
RBC: 4.3 x10E6/uL (ref 3.77–5.28)
RDW: 11.6 % — ABNORMAL LOW (ref 11.7–15.4)
WBC: 7.5 10*3/uL (ref 3.4–10.8)

## 2021-01-25 LAB — COMPREHENSIVE METABOLIC PANEL
ALT: 15 IU/L (ref 0–32)
AST: 16 IU/L (ref 0–40)
Albumin/Globulin Ratio: 2.4 — ABNORMAL HIGH (ref 1.2–2.2)
Albumin: 4.6 g/dL (ref 3.8–4.8)
Alkaline Phosphatase: 75 IU/L (ref 44–121)
BUN/Creatinine Ratio: 16 (ref 9–23)
BUN: 12 mg/dL (ref 6–24)
Bilirubin Total: 0.4 mg/dL (ref 0.0–1.2)
CO2: 22 mmol/L (ref 20–29)
Calcium: 9.6 mg/dL (ref 8.7–10.2)
Chloride: 103 mmol/L (ref 96–106)
Creatinine, Ser: 0.74 mg/dL (ref 0.57–1.00)
Globulin, Total: 1.9 g/dL (ref 1.5–4.5)
Glucose: 93 mg/dL (ref 65–99)
Potassium: 4.5 mmol/L (ref 3.5–5.2)
Sodium: 140 mmol/L (ref 134–144)
Total Protein: 6.5 g/dL (ref 6.0–8.5)
eGFR: 99 mL/min/{1.73_m2} (ref >59)

## 2021-01-25 LAB — HEMOGLOBIN A1C
Est. average glucose Bld gHb Est-mCnc: 114 mg/dL
Hgb A1c MFr Bld: 5.6 % (ref 4.8–5.6)

## 2021-01-25 LAB — LIPID PANEL WITH LDL/HDL RATIO
Cholesterol, Total: 198 mg/dL (ref 100–199)
HDL: 42 mg/dL (ref >39)
LDL Chol Calc (NIH): 128 mg/dL — ABNORMAL HIGH (ref 0–99)
LDL/HDL Ratio: 3 ratio (ref 0.0–3.2)
Triglycerides: 155 mg/dL — ABNORMAL HIGH (ref 0–149)
VLDL Cholesterol Cal: 28 mg/dL (ref 5–40)

## 2021-01-25 LAB — FOLATE: Folate: 2.6 ng/mL — ABNORMAL LOW (ref >3.0)

## 2021-01-25 LAB — T4: T4, Total: 6.6 ug/dL (ref 4.5–12.0)

## 2021-01-25 LAB — VITAMIN B12: Vitamin B-12: 135 pg/mL — ABNORMAL LOW (ref 232–1245)

## 2021-01-25 LAB — VITAMIN D 25 HYDROXY (VIT D DEFICIENCY, FRACTURES): Vit D, 25-Hydroxy: 26.5 ng/mL — ABNORMAL LOW (ref 30.0–100.0)

## 2021-01-25 LAB — INSULIN, RANDOM: INSULIN: 26.9 u[IU]/mL — ABNORMAL HIGH (ref 2.6–24.9)

## 2021-01-25 LAB — T3: T3, Total: 123 ng/dL (ref 71–180)

## 2021-01-25 NOTE — Progress Notes (Signed)
Chief Complaint:   Carrie Richards (MR# [id number]) is a 50 y.o. female who presents for evaluation and treatment of Carrie and related comorbidities. Current BMI is Body mass index is 32.19 kg/m. Carrie Richards has been struggling with her weight for many years and has been unsuccessful in either losing weight, maintaining weight loss, or reaching her healthy weight goal.  Carrie Richards is currently in the action stage of change and ready to dedicate time achieving and maintaining a healthier weight. Carrie Richards is interested in becoming our patient and working on intensive lifestyle modifications including (but not limited to) diet and exercise for weight loss.  [name]'s habits were reviewed today and are as follows: Her family eats meals together, she thinks her family will eat healthier with her, her desired weight loss is 41-46 lbs, she started gaining weight while not exercising and eating sweets, her heaviest weight ever was 191 pounds, she has significant food cravings issues, she snacks frequently in the evenings, she skips meals frequently, she is frequently drinking liquids with calories, she frequently makes poor food choices and she struggles with emotional eating.  Depression Screen Carrie Richards's Food and Mood (modified PHQ-9) score was 10.  Depression screen [name] 2/9 01/24/2021  Decreased Interest 3  Down, Depressed, Hopeless 1  PHQ - 2 Score 4  Altered sleeping 0  Tired, decreased energy 3  Change in appetite 2  Feeling bad or failure about yourself  1  Trouble concentrating 0  Moving slowly or fidgety/restless 0  Suicidal thoughts 0  PHQ-9 Score 10  Difficult doing work/chores Not difficult at all   Subjective:   1. Other fatigue Carrie Richards admits to daytime somnolence and admits to waking up still tired. Patent has a history of symptoms of daytime fatigue and morning headache. Carrie Richards generally gets 4 or 5 hours of sleep per night, and states that she has nightime awakenings. Snoring is  present. Apneic episodes are not present. Epworth Sleepiness Score is 14.  2. Shortness of breath on exertion Almeter notes increasing shortness of breath with exercising and seems to be worsening over time with weight gain. She notes getting out of breath sooner with activity than she used to. This has not gotten worse recently. Carrie Richards denies shortness of breath at rest or orthopnea.  3. Anemia, unspecified type Carrie Richards has a history of anemia, and she has no recent labs.  4. Hyperglycemia Carrie Richards has a history of elevated glucose, but she denies polyphagia.  5. Vitamin D deficiency Carrie Richards has a history of Vit D deficiency, and she notes fatigue.  6. Mixed hyperlipidemia Renay has a history of elevated LDL and low HDL with elevated triglycerides.  7. At risk for heart disease [name] is at a higher than average risk for cardiovascular disease due to Carrie.   Assessment/Plan:   1. Other fatigue Carrie Richards does feel that her weight is causing her energy to be lower than it should be. Fatigue may be related to Carrie, depression or many other causes. Labs will be ordered, and in the meanwhile, Carrie Richards will focus on self care including making healthy food choices, increasing physical activity and focusing on stress reduction.  - Insulin, random - T3 - T4 - CBC with Differential/Platelet - Comprehensive metabolic panel  2. Shortness of breath on exertion Carrie Richards does feel that she gets out of breath more easily that she used to when she exercises. Carrie Richards's shortness of breath appears to be Carrie related and exercise induced. She has agreed to work  on weight loss and gradually increase exercise to treat her exercise induced shortness of breath. Will continue to monitor closely.  3. Anemia, unspecified type We will check labs today. Carrie Richards will continue to follow up as directed. Orders and follow up as documented in patient record.  - Vitamin B12 - Folate  4. Hyperglycemia Fasting labs will be  obtained and results with be discussed with Carrie Richards in 2 weeks at her follow up visit. In the meanwhile Carrie Richards will start her Category 2 plan and will work on weight loss efforts.  - Hemoglobin A1c  5. Vitamin D deficiency Low Vitamin D level contributes to fatigue and are associated with Carrie, breast, and colon cancer. We will check labs today. Carrie Richards will follow-up for routine testing of Vitamin D, at least 2-3 times per year to avoid over-replacement.  - VITAMIN D 25 Hydroxy (Vit-D Deficiency, Fractures)  6. Mixed hyperlipidemia Cardiovascular risk and specific lipid/LDL goals reviewed. We discussed several lifestyle modifications today. We will check labs today. Carrie Richards will start on her Category 2 plan, and will continue to work on exercise and weight loss efforts. Orders and follow up as documented in patient record.   - Lipid Panel With LDL/HDL Ratio  7. Screening for depression Carrie Richards had a positive depression screening. Depression is commonly associated with Carrie and often results in emotional eating behaviors. We will monitor this closely and work on CBT to help improve the non-hunger eating patterns. Referral to Psychology may be required if no improvement is seen as she continues in our clinic.  8. At risk for heart disease Carrie Richards was given approximately 30 minutes of coronary artery disease prevention counseling today. She is 50 y.o. female and has risk factors for heart disease including Carrie. We discussed intensive lifestyle modifications today with an emphasis on specific weight loss instructions and strategies.   Repetitive spaced learning was employed today to elicit superior memory formation and behavioral change.  9. Class 1 Carrie with serious comorbidity and body mass index (BMI) of 32.0 to 32.9 in adult, unspecified Carrie type Carrie Richards is currently in the action stage of change and her goal is to continue with weight loss efforts. I recommend Carrie Richards begin the structured  treatment plan as follows:  She has agreed to the Category 2 Plan + 100 calories.  Exercise goals: No exercise has been prescribed at this time.   Behavioral modification strategies: increasing lean protein intake, no skipping meals and meal planning and cooking strategies.  She was informed of the importance of frequent follow-up visits to maximize her success with intensive lifestyle modifications for her multiple health conditions. She was informed we would discuss her lab results at her next visit unless there is a critical issue that needs to be addressed sooner. [place] agreed to keep her next visit at the agreed upon time to discuss these results.  Objective:   Blood pressure 108/72, pulse 77, temperature 98.4 F (36.9 C), height 5\' 2"  (1.575 m), weight 176 lb (79.8 kg), SpO2 97 %. Body mass index is 32.19 kg/m.  EKG: Normal sinus rhythm, rate 98 BPM.  Indirect Calorimeter completed today shows a VO2 of 270 and a REE of 1883.  Her calculated basal metabolic rate is 9562 thus her basal metabolic rate is better than expected.  General: Cooperative, alert, well developed, in no acute distress. HEENT: Conjunctivae and lids unremarkable. Cardiovascular: Regular rhythm.  Lungs: Normal work of breathing. Neurologic: No focal deficits.   Lab Results  Component Value Date  CREATININE 0.74 01/24/2021   BUN 12 01/24/2021   NA 140 01/24/2021   K 4.5 01/24/2021   CL 103 01/24/2021   CO2 22 01/24/2021   Lab Results  Component Value Date   ALT 15 01/24/2021   AST 16 01/24/2021   ALKPHOS 75 01/24/2021   BILITOT 0.4 01/24/2021   Lab Results  Component Value Date   HGBA1C 5.6 01/24/2021   Lab Results  Component Value Date   INSULIN 26.9 (H) 01/24/2021   Lab Results  Component Value Date   TSH 2.380 12/03/2020   Lab Results  Component Value Date   CHOL 198 01/24/2021   HDL 42 01/24/2021   LDLCALC 128 (H) 01/24/2021   TRIG 155 (H) 01/24/2021   CHOLHDL 5 07/01/2019    Lab Results  Component Value Date   WBC 7.5 01/24/2021   HGB 13.8 01/24/2021   HCT 40.6 01/24/2021   MCV 94 01/24/2021   PLT 359 01/24/2021   No results found for: IRON, TIBC, FERRITIN  Attestation Statements:   Reviewed by clinician on day of visit: allergies, medications, problem list, medical history, surgical history, family history, social history, and previous encounter notes.   I, Trixie Dredge, am acting as transcriptionist for Dennard Nip, MD.  I have reviewed the above documentation for accuracy and completeness, and I agree with the above. - Dennard Nip, MD

## 2021-02-07 ENCOUNTER — Other Ambulatory Visit: Payer: Self-pay

## 2021-02-07 ENCOUNTER — Ambulatory Visit (INDEPENDENT_AMBULATORY_CARE_PROVIDER_SITE_OTHER): Payer: No Typology Code available for payment source | Admitting: Family Medicine

## 2021-02-07 ENCOUNTER — Encounter (INDEPENDENT_AMBULATORY_CARE_PROVIDER_SITE_OTHER): Payer: Self-pay | Admitting: Family Medicine

## 2021-02-07 VITALS — BP 117/76 | HR 59 | Temp 98.3°F | Ht 62.0 in | Wt 175.0 lb

## 2021-02-07 DIAGNOSIS — E559 Vitamin D deficiency, unspecified: Secondary | ICD-10-CM | POA: Diagnosis not present

## 2021-02-07 DIAGNOSIS — E8881 Metabolic syndrome: Secondary | ICD-10-CM | POA: Diagnosis not present

## 2021-02-07 DIAGNOSIS — E782 Mixed hyperlipidemia: Secondary | ICD-10-CM

## 2021-02-07 DIAGNOSIS — E669 Obesity, unspecified: Secondary | ICD-10-CM

## 2021-02-07 DIAGNOSIS — Z6832 Body mass index (BMI) 32.0-32.9, adult: Secondary | ICD-10-CM

## 2021-02-07 DIAGNOSIS — E538 Deficiency of other specified B group vitamins: Secondary | ICD-10-CM | POA: Diagnosis not present

## 2021-02-07 DIAGNOSIS — Z9189 Other specified personal risk factors, not elsewhere classified: Secondary | ICD-10-CM | POA: Diagnosis not present

## 2021-02-07 MED ORDER — VITAMIN B-12 1000 MCG PO TABS: 1000.0000 ug | ORAL_TABLET | Freq: Every day | ORAL | 0 refills | Status: DC

## 2021-02-07 MED ORDER — VITAMIN D (ERGOCALCIFEROL) 1.25 MG (50000 UNIT) PO CAPS: 50000.0000 [IU] | ORAL_CAPSULE | ORAL | 0 refills | Status: DC

## 2021-02-13 NOTE — Progress Notes (Signed)
Chief Complaint:   OBESITY Carrie Richards is here to discuss her progress with her obesity treatment plan along with follow-up of her obesity related diagnoses. Carrie Richards is on the Category 2 Plan + 100 calories and states she is following her eating plan approximately 65% of the time. Carrie Richards states she is doing 0 minutes 0 times per week.  Today's visit was #: 2 Starting weight: 176 lbs Starting date: 01/24/2021 Today's weight: 175 lbs Today's date: 02/07/2021 Total lbs lost to date: 1 Total lbs lost since last in-office visit: 1  Interim History: Carrie Richards did well on her plan for the first week, and then she had a lot of things go wrong with the weather and losing power, etc, which made following her plan difficult. She has already gotten back on track however.  Subjective:   1. Mixed hyperlipidemia Carrie Richards has a new diagnosis of hyperlipidemia. Her triglycerides and LDL are elevated. She is not on statin, and she is working on diet and weight loss. I discussed labs with the patient today.  2. Vitamin D deficiency Carrie Richards has a new diagnosis of Vit D deficiency. Her Vit D level is low, and she notes fatigue. She is not on multivitamins or Vit D currently. I discussed labs with the patient today.  3. B12 deficiency Carrie Richards has a new diagnosis of B12 deficiency. Her B12 level is low, and she denies anemia. She denies a history of GI surgery, and she notes fatigue. I discussed labs with the patient today.  4. Insulin resistance Carrie Richards has a new diagnosis of insulin resistance. Her fasting insulin is significantly elevated above 5. She notes polyphagia, but this improved with her eating plan. I discussed labs with the patient today.  5. At risk for diabetes mellitus Carrie Richards is at higher than average risk for developing diabetes due to obesity.   Assessment/Plan:   1. Mixed hyperlipidemia Cardiovascular risk and specific lipid/LDL goals reviewed. We discussed several lifestyle modifications today. Carrie Richards  will continue her meal plan as is, and will continue exercise and weight loss efforts. We will recheck labs in 3 months. Orders and follow up as documented in patient record.   2. Vitamin D deficiency Low Vitamin D level contributes to fatigue and are associated with obesity, breast, and colon cancer. Carrie Richards agreed to start prescription Vitamin D 50,000 IU every week with no refills. She will follow-up for routine testing of Vitamin D, at least 2-3 times per year to avoid over-replacement.  - Vitamin D, Ergocalciferol, (DRISDOL) 1.25 MG (50000 UNIT) CAPS capsule; Take 1 capsule (50,000 Units total) by mouth every 7 (seven) days.  Dispense: 4 capsule; Refill: 0  3. B12 deficiency The diagnosis was reviewed with the patient. Carrie Richards agreed to start B12 1,000 mcg daily with no refills. We will continue to monitor. Orders and follow up as documented in patient record.  - vitamin B-12 (CYANOCOBALAMIN) 1000 MCG tablet; Take 1 tablet (1,000 mcg total) by mouth daily.  Dispense: 30 tablet; Refill: 0  4. Insulin resistance Carrie Richards will continue diet and exercise, and decreasing simple carbohydrates to help decrease the risk of diabetes. We will recheck labs in 3 months. Carrie Richards agreed to follow-up with [place] as directed to closely monitor her progress.  5. At risk for diabetes mellitus Carrie Richards was given approximately 30 minutes of diabetes education and counseling today. We discussed intensive lifestyle modifications today with an emphasis on weight loss as well as increasing exercise and decreasing simple carbohydrates in her diet. We also  reviewed medication options with an emphasis on risk versus benefit of those discussed.   Repetitive spaced learning was employed today to elicit superior memory formation and behavioral change.  6. Class 1 obesity with serious comorbidity and body mass index (BMI) of 32.0 to 32.9 in adult, unspecified obesity type Carrie Richards is currently in the action stage of change. As such, her  goal is to continue with weight loss efforts. She has agreed to the Category 2 Plan.   Behavioral modification strategies: increasing lean protein intake and meal planning and cooking strategies.  Carrie Richards has agreed to follow-up with our clinic in 2 weeks. She was informed of the importance of frequent follow-up visits to maximize her success with intensive lifestyle modifications for her multiple health conditions.   Objective:   Blood pressure 117/76, pulse (!) 59, temperature 98.3 F (36.8 C), height 5\' 2"  (1.575 m), weight 175 lb (79.4 kg), SpO2 97 %. Body mass index is 32.01 kg/m.  General: Cooperative, alert, well developed, in no acute distress. HEENT: Conjunctivae and lids unremarkable. Cardiovascular: Regular rhythm.  Lungs: Normal work of breathing. Neurologic: No focal deficits.   Lab Results  Component Value Date   CREATININE 0.74 01/24/2021   BUN 12 01/24/2021   NA 140 01/24/2021   K 4.5 01/24/2021   CL 103 01/24/2021   CO2 22 01/24/2021   Lab Results  Component Value Date   ALT 15 01/24/2021   AST 16 01/24/2021   ALKPHOS 75 01/24/2021   BILITOT 0.4 01/24/2021   Lab Results  Component Value Date   HGBA1C 5.6 01/24/2021   Lab Results  Component Value Date   INSULIN 26.9 (H) 01/24/2021   Lab Results  Component Value Date   TSH 2.380 12/03/2020   Lab Results  Component Value Date   CHOL 198 01/24/2021   HDL 42 01/24/2021   LDLCALC 128 (H) 01/24/2021   TRIG 155 (H) 01/24/2021   CHOLHDL 5 07/01/2019   Lab Results  Component Value Date   WBC 7.5 01/24/2021   HGB 13.8 01/24/2021   HCT 40.6 01/24/2021   MCV 94 01/24/2021   PLT 359 01/24/2021   No results found for: IRON, TIBC, FERRITIN  Attestation Statements:   Reviewed by clinician on day of visit: allergies, medications, problem list, medical history, surgical history, family history, social history, and previous encounter notes.   I, Trixie Dredge, am acting as transcriptionist for Dennard Nip, MD.  I have reviewed the above documentation for accuracy and completeness, and I agree with the above. -  Dennard Nip, MD

## 2021-02-17 ENCOUNTER — Ambulatory Visit (INDEPENDENT_AMBULATORY_CARE_PROVIDER_SITE_OTHER): Payer: No Typology Code available for payment source | Admitting: Internal Medicine

## 2021-02-17 ENCOUNTER — Other Ambulatory Visit: Payer: Self-pay

## 2021-02-17 ENCOUNTER — Encounter: Payer: Self-pay | Admitting: Internal Medicine

## 2021-02-17 VITALS — BP 130/70 | HR 81 | Ht 62.0 in | Wt 182.0 lb

## 2021-02-17 DIAGNOSIS — R55 Syncope and collapse: Secondary | ICD-10-CM

## 2021-02-17 DIAGNOSIS — E782 Mixed hyperlipidemia: Secondary | ICD-10-CM

## 2021-02-17 NOTE — Progress Notes (Signed)
Cardiology Office Note:    Date:  02/17/2021   ID:  AJAE STFLEUR, DOB [birth year], MRN [id number]  PCP:  Orland Mustard, MD  [facility] Cardiologist:  Riley Lam MD [name] Electrophysiologist:  None   CC: syncope f/u  History of Present Illness:    Carrie Richards is a 50 y.o. female with a hx of SVT and vasosvagal syncope, and hepatic steatosis, HLD who presents for chest pain. In interim of this visit, patient stress test and ziopatch.  Patient notes that she is doing OK.  Since last visit notes a couple of episodes racing  changes.  Relevant interval testing or therapy include Ziopatch and stress test.  There are no interval hospital/ED visit.    No chest pain or pressure .  No SOB/DOE and no PND/Orthopnea.  No weight gain or leg swelling. Had palpitations during the three triggered events (Sinus and noncardiac).  Was at her dining table; then was in bed.  Has had a cough related syncopal spell.  Has also had a hot shower related syncopal spell in the bathroom.  Ambulatory blood pressure 130/70.   Past Medical History:  Diagnosis Date   Anemia    Anxiety    Depression    Difficulty breathing    at times, accompanied by increased heart rate   Other fatigue    Reactive depression 06/16/2017   Shortness of breath    Shortness of breath on exertion     Past Surgical History:  Procedure Laterality Date   ABDOMINAL HYSTERECTOMY     BROW LIFT     CESAREAN SECTION     CHOLECYSTECTOMY     WRIST SURGERY Right     Current Medications: Current Meds  Medication Sig   cyclobenzaprine (FLEXERIL) 10 MG tablet Take 1 tablet (10 mg total) by mouth 3 (three) times daily as needed for muscle spasms.   LORazepam (ATIVAN) 1 MG tablet Take 0.5-1 tablets (0.5-1 mg total) by mouth at bedtime as needed. for sleep   nitrofurantoin, macrocrystal-monohydrate, (MACROBID) 100 MG capsule TAKE 1 CAPSULE BY MOUTH AT BEDTIME.   topiramate (TOPAMAX) 50 MG tablet  Take 1 tablet (50 mg total) by mouth 2 (two) times daily.   vitamin B-12 (CYANOCOBALAMIN) 1000 MCG tablet Take 1 tablet (1,000 mcg total) by mouth daily.   Vitamin D, Ergocalciferol, (DRISDOL) 1.25 MG (50000 UNIT) CAPS capsule Take 1 capsule (50,000 Units total) by mouth every 7 (seven) days.     Allergies:   Patient has no known allergies.   Social History   Socioeconomic History   Marital status: Married    Spouse name: Thayer Ohm   Number of children: 5   Years of education: Not on file   Highest education level: Not on file  Occupational History   Occupation: Human Resourcees  Tobacco Use   Smoking status: Never Smoker   Smokeless tobacco: Never Used  Vaping Use   Vaping Use: Never used  Substance and Sexual Activity   Alcohol use: Yes    Alcohol/week: 2.0 standard drinks    Types: 2 Standard drinks or equivalent per week   Drug use: No   Sexual activity: Yes    Partners: Male    Birth control/protection: Surgical  Other Topics Concern   Not on file  Social History Narrative   Not on file   Social Determinants of Health   Financial Resource Strain: Not on file  Food Insecurity: Not on file  Transportation Needs: Not on file  Physical Activity: Not on file  Stress: Not on file  Social Connections: Not on file     Family History: The patient's family history includes Breast cancer in her mother and paternal grandmother; Cancer in her father and mother; Depression in her mother, sister, and sister; Diabetes in her father and mother; Heart attack in her maternal grandmother; Heart disease in her sister; Hyperlipidemia in her father and mother; Hypertension in her father, mother, sister, and sister; Melanoma in her father; Ovarian cancer in her mother; Sleep apnea in her father and mother. History of coronary artery disease notable for grandmother. History of heart failure notable for grandfather. History of arrhythmia notable for multiple aunts, uncles, and  sister with atrial fibrillation.  ROS:   Please see the history of present illness.    All other systems reviewed and are negative.  EKGs/Labs/Other Studies Reviewed:    The following studies were reviewed today:  EKG:   12/08/20: SR rate 98 WNL 12/05/2020: sinus tachycardia rate 122  Transthoracic Echocardiogram: Date: 12/08/20 Results: IMPRESSIONS  1. Left ventricular ejection fraction, by estimation, is 60 to 65%. The  left ventricle has normal function. The left ventricle has no regional  wall motion abnormalities. Left ventricular diastolic parameters were  normal.  2. Right ventricular systolic function is normal. The right ventricular  size is normal.  3. The mitral valve is normal in structure. Trivial mitral valve  regurgitation.  4. The aortic valve is normal in structure. Aortic valve regurgitation is  not visualized. No aortic stenosis is present.  5. The inferior vena cava is normal in size with greater than 50%  respiratory variability, suggesting right atrial pressure of 3 mmHg.   Cardiac Event Monitoring: Date: 01/03/21 Results:  Patient had a minimum heart rate of 59 bpm, maximum heart rate of 187 bpm, and average heart rate of 91 bpm.  Predominant underlying rhythm was sinus rhythm.  Three runs of supreventricular tachycardia occurred lasting 13 beats at longest with a max rate of 187 bpm at fastest.  Isolated PACs were rare (<1.0%), with rare couplets and triplets present.  Isolated PVCs were rare (<1.0%).  No evidence of complete heart block.  Triggered and diary events associated with sinus rhythm and sinus tachycardia.   Occasional, asymptomatic SVT.  ECG Stress Testing : Date: 12/29/20 Results: Exercise time: 6 min Workload: 7 METS, average exercise capacity Test stopped due to: patient request BP response: normal HR response: normal Rhythm: sinus rhythm.  No ischemia on stress ECG.  Recent Labs: 12/03/2020: TSH 2.380 01/24/2021: ALT  15; BUN 12; Creatinine, Ser 0.74; Hemoglobin 13.8; Platelets 359; Potassium 4.5; Sodium 140  Recent Lipid Panel    Component Value Date/Time   CHOL 198 01/24/2021 1625   TRIG 155 (H) 01/24/2021 1625   HDL 42 01/24/2021 1625   CHOLHDL 5 07/01/2019 0845   VLDL 33.8 07/01/2019 0845   LDLCALC 128 (H) 01/24/2021 1625     Risk Assessment/Calculations:     The 10-year ASCVD risk score Denman George DC Montez Hageman., et al., 2013) is: 1.6%   Values used to calculate the score:     Age: 12 years     Sex: Female     Is Non-Hispanic African American: No     Diabetic: No     Tobacco smoker: No     Systolic Blood Pressure: 130 mmHg     Is BP treated: No     HDL Cholesterol: 42 mg/dL     Total Cholesterol: 198 mg/dL  Physical Exam:    VS:  BP 130/70    Pulse 81    Ht 5\' 2"  (1.575 m)    Wt 182 lb (82.6 kg)    LMP  (LMP Unknown)    SpO2 98%    BMI 33.29 kg/m     Wt Readings from Last 3 Encounters:  02/17/21 182 lb (82.6 kg)  02/07/21 175 lb (79.4 kg)  01/24/21 176 lb (79.8 kg)    GEN:  Well nourished, well developed in no acute distress HEENT: Normal NECK: No JVD; No carotid bruits LYMPHATICS: No lymphadenopathy CARDIAC: RRR, no murmurs, rubs, gallops RESPIRATORY:  Clear to auscultation without rales, wheezing or rhonchi  ABDOMEN: Soft, non-tender, non-distended MUSCULOSKELETAL:  No edema; No deformity  SKIN: Warm and dry NEUROLOGIC:  Alert and oriented x 3 PSYCHIATRIC:  Normal affect   ASSESSMENT:    1. Vasovagal syncope   2. Mixed hyperlipidemia    PLAN:    In order of problems listed above:  Vasovagal syncope - discussed the benefit of exercise: Isometric exercises; transitioning slowly with your body, the benefits of walking program (100- 300 steps per awakening hour during the day), and yoga with focusing on breathing may reduce symptoms and that patient must avoid prolonged bedrest - discussed compressive stockings or abdominal binders to reduce symptoms related to venous pooling;  Waist-high stockings at 30 to 40 mmHg or abdominal binders at 10 mmHg - gave education on slow rise, Valsalva maneuver exacerbation, temperature change - discussed muscle contraction and leg crossing - discussed triggers including - gave handout on exercises  Hyperlipidemia (mixed) - gave education on dietary changes  Fall follow up unless new symptoms or abnormal test results warranting change in plan  Would be reasonable for  Video Visit Follow up Would be reasonable for  APP Follow up   Medication Adjustments/Labs and Tests Ordered: Current medicines are reviewed at length with the patient today.  Concerns regarding medicines are outlined above.  No orders of the defined types were placed in this encounter.  No orders of the defined types were placed in this encounter.   Patient Instructions  Medication Instructions:  Your physician recommends that you continue on your current medications as directed. Please refer to the Current Medication list given to you today.  *If you need a refill on your cardiac medications before your next appointment, please call your pharmacy*   Lab Work: NONE If you have labs (blood work) drawn today and your tests are completely normal, you will receive your results only by:  MyChart Message (if you have MyChart) OR  A paper copy in the mail If you have any lab test that is abnormal or we need to change your treatment, we will call you to review the results.   Testing/Procedures: NONE   Follow-Up: At [facility], you and your health needs are our priority.  As part of our continuing mission to provide you with exceptional heart care, we have created designated Provider Care Teams.  These Care Teams include your primary Cardiologist (physician) and Advanced Practice Providers (APPs -  Physician Assistants and Nurse Practitioners) who all work together to provide you with the care you need, when you need it.  We recommend signing up for  the patient portal called "MyChart".  Sign up information is provided on this After Visit Summary.  MyChart is used to connect with patients for Virtual Visits (Telemedicine).  Patients are able to view lab/test results, encounter notes, upcoming appointments, etc.  Non-urgent messages can be sent to your provider as well.   To learn more about what you can do with MyChart, go to [email or web address].    Your next appointment:   6-7 month(s)  The format for your next appointment:   In Person  Provider:   You may see Riley Lam, MD or one of the following Advanced Practice Providers on your designated Care Team:    Ronie Spies, PA-C  Jacolyn Reedy, PA-C    Other Instructions Begin to incorporate exercises given to you by Dr. Izora Ribas into your daily routine.      Signed, Christell Constant, MD  02/17/2021 4:52 PM    [facility]

## 2021-02-17 NOTE — Patient Instructions (Signed)
Medication Instructions:  Your physician recommends that you continue on your current medications as directed. Please refer to the Current Medication list given to you today.  *If you need a refill on your cardiac medications before your next appointment, please call your pharmacy*   Lab Work: NONE If you have labs (blood work) drawn today and your tests are completely normal, you will receive your results only by: Marland Kitchen MyChart Message (if you have MyChart) OR . A paper copy in the mail If you have any lab test that is abnormal or we need to change your treatment, we will call you to review the results.   Testing/Procedures: NONE   Follow-Up: At [facility], you and your health needs are our priority.  As part of our continuing mission to provide you with exceptional heart care, we have created designated Provider Care Teams.  These Care Teams include your primary Cardiologist (physician) and Advanced Practice Providers (APPs -  Physician Assistants and Nurse Practitioners) who all work together to provide you with the care you need, when you need it.  We recommend signing up for the patient portal called "MyChart".  Sign up information is provided on this After Visit Summary.  MyChart is used to connect with patients for Virtual Visits (Telemedicine).  Patients are able to view lab/test results, encounter notes, upcoming appointments, etc.  Non-urgent messages can be sent to your provider as well.   To learn more about what you can do with MyChart, go to [email or web address].    Your next appointment:   6-7 month(s)  The format for your next appointment:   In Person  Provider:   You may see Rudean Haskell, MD or one of the following Advanced Practice Providers on your designated Care Team:    Melina Copa, PA-C  Ermalinda Barrios, PA-C    Other Instructions Begin to incorporate exercises given to you by Dr. Gasper Sells into your daily routine.

## 2021-02-22 ENCOUNTER — Encounter (INDEPENDENT_AMBULATORY_CARE_PROVIDER_SITE_OTHER): Payer: Self-pay | Admitting: Adult Health

## 2021-02-22 ENCOUNTER — Telehealth (INDEPENDENT_AMBULATORY_CARE_PROVIDER_SITE_OTHER): Payer: No Typology Code available for payment source | Admitting: Adult Health

## 2021-02-22 ENCOUNTER — Other Ambulatory Visit: Payer: Self-pay

## 2021-02-22 DIAGNOSIS — E559 Vitamin D deficiency, unspecified: Secondary | ICD-10-CM | POA: Diagnosis not present

## 2021-02-22 DIAGNOSIS — Z6832 Body mass index (BMI) 32.0-32.9, adult: Secondary | ICD-10-CM | POA: Diagnosis not present

## 2021-02-22 DIAGNOSIS — E669 Obesity, unspecified: Secondary | ICD-10-CM | POA: Insufficient documentation

## 2021-02-22 DIAGNOSIS — R0981 Nasal congestion: Secondary | ICD-10-CM | POA: Insufficient documentation

## 2021-02-22 DIAGNOSIS — E66811 Obesity, class 1: Secondary | ICD-10-CM | POA: Insufficient documentation

## 2021-02-27 NOTE — Progress Notes (Signed)
TeleHealth Visit:  Due to the COVID-19 pandemic, this visit was completed with telemedicine (audio/video) technology to reduce patient and provider exposure as well as to preserve personal protective equipment.   Carrie Richards has verbally consented to this TeleHealth visit. The patient is located at home, the provider is located at the [facility] office. The participants in this visit include the listed provider and patient. The visit was conducted today via video.   Chief Complaint: OBESITY Carrie Richards is here to discuss her progress with her obesity treatment plan along with follow-up of her obesity related diagnoses. Carrie Richards is on the Category 2 Plan and states she is following her eating plan approximately 80-90% of the time. Carrie Richards states she is walking 45 minutes 7 times per week.  Today's visit was #: 3 Starting weight: 176 lbs Starting date: 01/24/2021  Interim History: Carrie Richards is experiencing pharyngitis, headache, and nasal congestion. Her symptoms started 4 days ago. She denies known exposure to COVID-19. She is fully vaccinated with [place] booster December 2021. She will present for PCR testing at local pharmacy. She feels that she has eaten on plan 80-90% and maintained her weight since in person OV.  Subjective:   1. Vitamin D deficiency [name]'s Vitamin D level was well below goal of 50, at 26.5 on 01/24/2021. She is currently taking prescription vitamin D 50,000 IU each week. She denies nausea, vomiting or muscle weakness.   Ref. Range 01/24/2021 16:25  Vitamin D, 25-Hydroxy Latest Ref Range: 30.0 - 100.0 ng/mL 26.5 (L)   2. Nasal congestion Carrie Richards is experiencing pharyngitis, headache, and nasal congestion. Her symptoms started 4 days ago. She denies known exposure to COVID-19. She is fully vaccinated with [place] booster December 2021. She will present for PCR testing at local pharmacy later today.  Assessment/Plan:   1. Vitamin D deficiency Low Vitamin D level contributes  to fatigue and are associated with obesity, breast, and colon cancer. She agrees to continue to take prescription Vitamin D @50 ,000 IU every week and will follow-up for routine testing of Vitamin D, at least 2-3 times per year to avoid over-replacement.  2. Nasal congestion Present for SARS-COV-2 PCR testing. Remain home with symptoms and until test results are known. Increase water intake, rest, and Vit C. OTC antihistamines and Vicks Vapor Rub.  3. Class 1 obesity with serious comorbidity and body mass index (BMI) of 32.0 to 32.9 in adult, unspecified obesity type Carrie Richards is currently in the action stage of change. As such, her goal is to continue with weight loss efforts. She has agreed to the Category 2 Plan.   Exercise goals: As is- walking 7 days 45 minutes  Behavioral modification strategies: increasing lean protein intake, meal planning and cooking strategies and planning for success.  Carrie Richards has agreed to follow-up with our clinic in 2 weeks. She was informed of the importance of frequent follow-up visits to maximize her success with intensive lifestyle modifications for her multiple health conditions.  Objective:   VITALS: Per patient if applicable, see vitals. GENERAL: Alert and in no acute distress. CARDIOPULMONARY: No increased WOB. Speaking in clear sentences.  PSYCH: Pleasant and cooperative. Speech normal rate and rhythm. Affect is appropriate. Insight and judgement are appropriate. Attention is focused, linear, and appropriate.  NEURO: Oriented as arrived to appointment on time with no prompting.   Lab Results  Component Value Date   CREATININE 0.74 01/24/2021   BUN 12 01/24/2021   NA 140 01/24/2021   K 4.5 01/24/2021  CL 103 01/24/2021   CO2 22 01/24/2021   Lab Results  Component Value Date   ALT 15 01/24/2021   AST 16 01/24/2021   ALKPHOS 75 01/24/2021   BILITOT 0.4 01/24/2021   Lab Results  Component Value Date   HGBA1C 5.6 01/24/2021   Lab Results   Component Value Date   INSULIN 26.9 (H) 01/24/2021   Lab Results  Component Value Date   TSH 2.380 12/03/2020   Lab Results  Component Value Date   CHOL 198 01/24/2021   HDL 42 01/24/2021   LDLCALC 128 (H) 01/24/2021   TRIG 155 (H) 01/24/2021   CHOLHDL 5 07/01/2019   Lab Results  Component Value Date   WBC 7.5 01/24/2021   HGB 13.8 01/24/2021   HCT 40.6 01/24/2021   MCV 94 01/24/2021   PLT 359 01/24/2021   No results found for: IRON, TIBC, FERRITIN  Attestation Statements:   Reviewed by clinician on day of visit: allergies, medications, problem list, medical history, surgical history, family history, social history, and previous encounter notes.  Time spent on visit including pre-visit chart review and post-visit charting and care was 28 minutes.   Coral Ceo, am acting as Location manager for Mina Marble, NP.  I have reviewed the above documentation for accuracy and completeness, and I agree with the above. - Prudy Candy d. Dorri Ozturk, NP-C

## 2021-03-09 ENCOUNTER — Ambulatory Visit (INDEPENDENT_AMBULATORY_CARE_PROVIDER_SITE_OTHER): Payer: No Typology Code available for payment source | Admitting: Family Medicine

## 2021-03-09 ENCOUNTER — Encounter (INDEPENDENT_AMBULATORY_CARE_PROVIDER_SITE_OTHER): Payer: Self-pay

## 2021-03-17 ENCOUNTER — Other Ambulatory Visit: Payer: Self-pay | Admitting: Family Medicine

## 2021-03-17 DIAGNOSIS — E669 Obesity, unspecified: Secondary | ICD-10-CM

## 2021-04-10 DIAGNOSIS — Z20822 Contact with and (suspected) exposure to covid-19: Secondary | ICD-10-CM | POA: Diagnosis not present

## 2021-04-19 DIAGNOSIS — Z20822 Contact with and (suspected) exposure to covid-19: Secondary | ICD-10-CM | POA: Diagnosis not present

## 2021-05-08 ENCOUNTER — Other Ambulatory Visit: Payer: Self-pay | Admitting: Family Medicine

## 2021-06-08 ENCOUNTER — Ambulatory Visit: Payer: Self-pay | Admitting: Physician Assistant

## 2021-07-11 DIAGNOSIS — C50911 Malignant neoplasm of unspecified site of right female breast: Secondary | ICD-10-CM | POA: Insufficient documentation

## 2021-08-11 ENCOUNTER — Other Ambulatory Visit: Payer: Self-pay | Admitting: Family Medicine

## 2021-09-07 ENCOUNTER — Other Ambulatory Visit: Payer: Self-pay | Admitting: Family Medicine

## 2021-09-08 NOTE — Telephone Encounter (Signed)
30 day supply given. She needs to establish with a new PCP.

## 2021-12-21 LAB — HM DIABETES EYE EXAM

## 2022-02-07 HISTORY — PX: MASTECTOMY: SHX3

## 2022-03-09 DIAGNOSIS — Z1501 Genetic susceptibility to malignant neoplasm of breast: Secondary | ICD-10-CM | POA: Insufficient documentation

## 2022-05-14 DIAGNOSIS — Z1501 Genetic susceptibility to malignant neoplasm of breast: Secondary | ICD-10-CM | POA: Insufficient documentation

## 2022-06-18 IMAGING — DX DG CHEST 1V PORT
1 series · 1 of 1 positions shown · non-contrast
Comparison: 12/03/2012

CLINICAL DATA: Chest pain and shortness of breath

EXAM:
PORTABLE CHEST 1 VIEW

[chest ap]
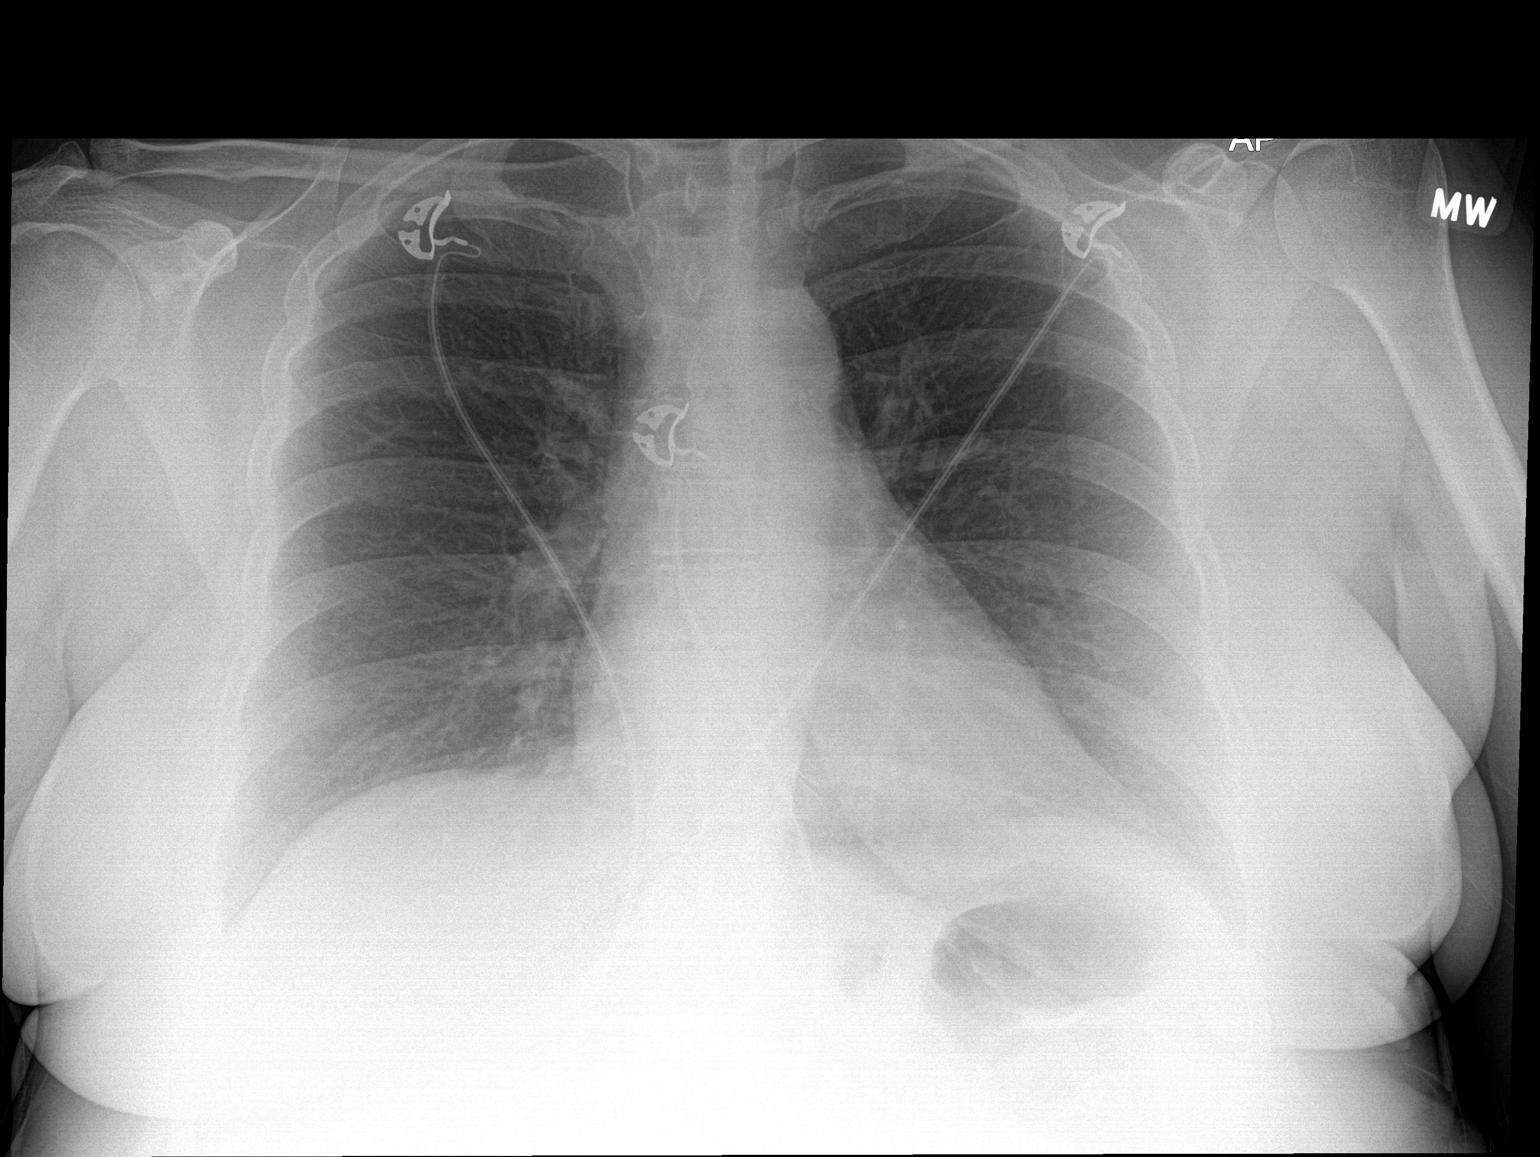

[1 of 1 positions shown; findings below may reference images not displayed]

FINDINGS: The heart size and mediastinal contours are within normal limits.
Both lungs are clear. The visualized skeletal structures are
unremarkable.
IMPRESSION: No active disease.

## 2022-07-04 ENCOUNTER — Encounter (INDEPENDENT_AMBULATORY_CARE_PROVIDER_SITE_OTHER): Payer: Self-pay

## 2023-01-01 LAB — HM DIABETES EYE EXAM

## 2023-02-04 DIAGNOSIS — I10 Essential (primary) hypertension: Secondary | ICD-10-CM | POA: Insufficient documentation

## 2023-06-25 DIAGNOSIS — Z9013 Acquired absence of bilateral breasts and nipples: Secondary | ICD-10-CM | POA: Insufficient documentation

## 2023-07-12 ENCOUNTER — Ambulatory Visit
Admission: EM | Admit: 2023-07-12 | Discharge: 2023-07-12 | Disposition: A | Discharge status: Home / Self Care | Payer: Self-pay | Attending: Internal Medicine | Admitting: Internal Medicine

## 2023-07-12 ENCOUNTER — Ambulatory Visit (INDEPENDENT_AMBULATORY_CARE_PROVIDER_SITE_OTHER): Payer: Self-pay

## 2023-07-12 DIAGNOSIS — M5441 Lumbago with sciatica, right side: Secondary | ICD-10-CM

## 2023-07-12 DIAGNOSIS — M25511 Pain in right shoulder: Secondary | ICD-10-CM

## 2023-07-12 DIAGNOSIS — M5442 Lumbago with sciatica, left side: Secondary | ICD-10-CM

## 2023-07-12 DIAGNOSIS — G8929 Other chronic pain: Secondary | ICD-10-CM

## 2023-07-12 HISTORY — DX: Malignant neoplasm of unspecified site of unspecified female breast: C50.919

## 2023-07-12 MED ORDER — NAPROXEN 500 MG PO TABS: 500.0000 mg | ORAL_TABLET | Freq: Two times a day (BID) | ORAL | 0 refills | Status: DC

## 2023-07-12 MED ORDER — CYCLOBENZAPRINE HCL 10 MG PO TABS: 10.0000 mg | ORAL_TABLET | Freq: Three times a day (TID) | ORAL | 0 refills | Status: DC | PRN

## 2023-07-12 NOTE — ED Provider Notes (Signed)
[facility]  Note:  This document was prepared using Conservation officer, historic buildings and may include unintentional dictation errors.  MRN: [id number] DOB: 1971/10/05  Subjective:   Carrie Richards is a 52 y.o. female presenting for 1 month history of persistent moderate lateral shoulder pain with decreased range of motion.  Patient was undergoing physical therapy for lymphedema secondary to her breast cancer treatment.  She has been in remission and was discontinued off of physical therapy.  Plan is to restart it soon.  No trauma, known injury otherwise.  No rashes, swelling.  No neck pain, radicular symptoms.  Has used ibuprofen and Tylenol with some relief.  No current facility-administered medications for this encounter.  Current Outpatient Medications:    albuterol (VENTOLIN HFA) 108 (90 Base) MCG/ACT inhaler, Inhale into the lungs., Disp: , Rfl:    gabapentin (NEURONTIN) 300 MG capsule, Take by mouth., Disp: , Rfl:    Ibuprofen-Acetaminophen (DUAL ACTION PAIN RELIEF PO), Take by mouth., Disp: , Rfl:    metoprolol succinate (TOPROL-XL) 25 MG 24 hr tablet, Take by mouth., Disp: , Rfl:    mirtazapine (REMERON) 15 MG tablet, Take by mouth., Disp: , Rfl:    traMADol (ULTRAM) 50 MG tablet, Take by mouth., Disp: , Rfl:    clonazePAM (KLONOPIN) 1 MG disintegrating tablet, Take by mouth., Disp: , Rfl:    cyclobenzaprine (FLEXERIL) 10 MG tablet, Take 1 tablet (10 mg total) by mouth 3 (three) times daily as needed for muscle spasms., Disp: 30 tablet, Rfl: 1   LORazepam (ATIVAN) 1 MG tablet, Take 0.5-1 tablets (0.5-1 mg total) by mouth at bedtime as needed. for sleep, Disp: 30 tablet, Rfl: 4   metFORMIN (GLUCOPHAGE) 1000 MG tablet, Take by mouth., Disp: , Rfl:    nitrofurantoin, macrocrystal-monohydrate, (MACROBID) 100 MG capsule, TAKE 1 CAPSULE BY MOUTH EVERYDAY AT BEDTIME, Disp: 30 capsule, Rfl: 0   ondansetron (ZOFRAN-ODT) 4 MG disintegrating tablet, PLEASE SEE ATTACHED  FOR DETAILED DIRECTIONS, Disp: , Rfl:    topiramate (TOPAMAX) 50 MG tablet, TAKE 1 TABLET BY MOUTH TWICE A DAY, Disp: 180 tablet, Rfl: 1   vitamin B-12 (CYANOCOBALAMIN) 1000 MCG tablet, Take 1 tablet (1,000 mcg total) by mouth daily., Disp: 30 tablet, Rfl: 0   Vitamin D, Ergocalciferol, (DRISDOL) 1.25 MG (50000 UNIT) CAPS capsule, Take 1 capsule (50,000 Units total) by mouth every 7 (seven) days., Disp: 4 capsule, Rfl: 0   No Known Allergies  Past Medical History:  Diagnosis Date   Anemia    Anxiety    Breast cancer (HCC)    Depression    Difficulty breathing    at times, accompanied by increased heart rate   Other fatigue    Reactive depression 06/16/2017   Shortness of breath    Shortness of breath on exertion      Past Surgical History:  Procedure Laterality Date   ABDOMINAL HYSTERECTOMY     BREAST RECONSTRUCTION Right    BROW LIFT     CESAREAN SECTION     CHOLECYSTECTOMY     WRIST SURGERY Right     Family History  Problem Relation Age of Onset   Diabetes Mother    Cancer Mother    Breast cancer Mother    Ovarian cancer Mother    Hypertension Mother    Hyperlipidemia Mother    Depression Mother    Sleep apnea Mother    Melanoma Father    Hypertension Father    Hyperlipidemia Father  Diabetes Father    Cancer Father    Sleep apnea Father    Hypertension Sister    Depression Sister    Hypertension Sister    Heart disease Sister    Depression Sister    Heart attack Maternal Grandmother    Breast cancer Paternal Grandmother     Social History   Tobacco Use   Smoking status: Never   Smokeless tobacco: Never  Vaping Use   Vaping status: Never Used  Substance Use Topics   Alcohol use: Yes    Alcohol/week: 2.0 standard drinks of alcohol    Types: 2 Standard drinks or equivalent per week   Drug use: No    ROS   Objective:   Vitals: BP 109/73 (BP Location: Left Arm)   Pulse 95   Temp (!) 97.5 F (36.4 C) (Oral)   Resp 16   LMP  (LMP Unknown)    SpO2 95%   Physical Exam Musculoskeletal:     Right shoulder: Tenderness (focal over superior lateral deltoids) present. No swelling, deformity, effusion, laceration, bony tenderness or crepitus. Decreased range of motion. Normal strength. Normal pulse.     Right upper arm: No swelling, edema, deformity, lacerations, tenderness or bony tenderness.       Arms:       Assessment and Plan :   PDMP not reviewed this encounter.  1. Acute pain of right shoulder   2. Chronic right-sided low back pain with bilateral sciatica    Possible bursitis, tendonitis. Recommended light shoulder movements, naproxen (has done well with this in the past), follow up with PT.  X-ray over-read was pending at time of discharge, recommended follow up with only abnormal results. Otherwise will not call for negative over-read. Patient was in agreement. Counseled patient on potential for adverse effects with medications prescribed/recommended today, ER and return-to-clinic precautions discussed, patient verbalized understanding.    Wallis Bamberg, PA-C 07/12/23 1159

## 2023-07-12 NOTE — ED Triage Notes (Addendum)
Pt reports right shoulder pain and right arm burning pain x 1 month after she had physical therapy on right breast and right arm. Pt reports any type of movement gives her pain. Pt last breast surgery was 06/24/23. Tramadol and Tylenol-ibuprofen gives relief.   Pt reports she had pain meds today.

## 2023-07-12 NOTE — Discharge Instructions (Addendum)
Will call you later today to go over your x-ray results. Keep follow up as planned with physical therapy.

## 2023-09-03 ENCOUNTER — Ambulatory Visit (INDEPENDENT_AMBULATORY_CARE_PROVIDER_SITE_OTHER): Payer: Self-pay | Admitting: Physician Assistant

## 2023-09-03 ENCOUNTER — Encounter: Payer: Self-pay | Admitting: Physician Assistant

## 2023-09-03 VITALS — BP 136/80 | HR 104 | Temp 97.7°F | Ht 62.5 in | Wt 169.4 lb

## 2023-09-03 DIAGNOSIS — F419 Anxiety disorder, unspecified: Secondary | ICD-10-CM

## 2023-09-03 DIAGNOSIS — F5102 Adjustment insomnia: Secondary | ICD-10-CM

## 2023-09-03 DIAGNOSIS — F321 Major depressive disorder, single episode, moderate: Secondary | ICD-10-CM

## 2023-09-03 DIAGNOSIS — K5900 Constipation, unspecified: Secondary | ICD-10-CM

## 2023-09-03 DIAGNOSIS — Z171 Estrogen receptor negative status [ER-]: Secondary | ICD-10-CM

## 2023-09-03 DIAGNOSIS — C50911 Malignant neoplasm of unspecified site of right female breast: Secondary | ICD-10-CM

## 2023-09-03 DIAGNOSIS — E119 Type 2 diabetes mellitus without complications: Secondary | ICD-10-CM | POA: Insufficient documentation

## 2023-09-03 DIAGNOSIS — Z7984 Long term (current) use of oral hypoglycemic drugs: Secondary | ICD-10-CM

## 2023-09-03 DIAGNOSIS — E1169 Type 2 diabetes mellitus with other specified complication: Secondary | ICD-10-CM

## 2023-09-03 DIAGNOSIS — R002 Palpitations: Secondary | ICD-10-CM

## 2023-09-03 DIAGNOSIS — G629 Polyneuropathy, unspecified: Secondary | ICD-10-CM

## 2023-09-03 LAB — COMPREHENSIVE METABOLIC PANEL
ALT: 16 U/L (ref 0–35)
AST: 15 U/L (ref 0–37)
Albumin: 4.9 g/dL (ref 3.5–5.2)
Alkaline Phosphatase: 101 U/L (ref 39–117)
BUN: 13 mg/dL (ref 6–23)
CO2: 27 meq/L (ref 19–32)
Calcium: 10.4 mg/dL (ref 8.4–10.5)
Chloride: 104 meq/L (ref 96–112)
Creatinine, Ser: 0.84 mg/dL (ref 0.40–1.20)
GFR: 80.08 mL/min (ref >60.00)
Glucose, Bld: 118 mg/dL — ABNORMAL HIGH (ref 70–99)
Potassium: 4.6 meq/L (ref 3.5–5.1)
Sodium: 140 meq/L (ref 135–145)
Total Bilirubin: 0.4 mg/dL (ref 0.2–1.2)
Total Protein: 6.8 g/dL (ref 6.0–8.3)

## 2023-09-03 LAB — LIPID PANEL
Cholesterol: 190 mg/dL (ref 0–200)
HDL: 38.9 mg/dL — ABNORMAL LOW (ref >39.00)
LDL Cholesterol: 102 mg/dL — ABNORMAL HIGH (ref 0–99)
NonHDL: 151.35
Total CHOL/HDL Ratio: 5
Triglycerides: 245 mg/dL — ABNORMAL HIGH (ref 0.0–149.0)
VLDL: 49 mg/dL — ABNORMAL HIGH (ref 0.0–40.0)

## 2023-09-03 LAB — CBC WITH DIFFERENTIAL/PLATELET
Basophils Absolute: 0 10*3/uL (ref 0.0–0.1)
Basophils Relative: 1 % (ref 0.0–3.0)
Eosinophils Absolute: 0.2 10*3/uL (ref 0.0–0.7)
Eosinophils Relative: 5.1 % — ABNORMAL HIGH (ref 0.0–5.0)
HCT: 41.9 % (ref 36.0–46.0)
Hemoglobin: 13.4 g/dL (ref 12.0–15.0)
Lymphocytes Relative: 37.2 % (ref 12.0–46.0)
Lymphs Abs: 1.4 10*3/uL (ref 0.7–4.0)
MCHC: 31.9 g/dL (ref 30.0–36.0)
MCV: 95.4 fL (ref 78.0–100.0)
Monocytes Absolute: 0.2 10*3/uL (ref 0.1–1.0)
Monocytes Relative: 6.2 % (ref 3.0–12.0)
Neutro Abs: 1.9 10*3/uL (ref 1.4–7.7)
Neutrophils Relative %: 50.5 % (ref 43.0–77.0)
Platelets: 303 10*3/uL (ref 150.0–400.0)
RBC: 4.4 Mil/uL (ref 3.87–5.11)
RDW: 13.4 % (ref 11.5–15.5)
WBC: 3.7 10*3/uL — ABNORMAL LOW (ref 4.0–10.5)

## 2023-09-03 LAB — HEMOGLOBIN A1C: Hgb A1c MFr Bld: 5.5 % (ref 4.6–6.5)

## 2023-09-03 MED ORDER — ZOLPIDEM TARTRATE 5 MG PO TABS: 5.0000 mg | ORAL_TABLET | Freq: Every evening | ORAL | 0 refills | Status: DC | PRN

## 2023-09-03 NOTE — Progress Notes (Signed)
Carrie Richards is a 52 y.o. female here to establish care.   History of Present Illness:   Chief Complaint  Patient presents with   Establish Care   HPI  Husband present for most of the visit -- I did ask the husband to leave as she has admitted that he had an affair and there is significant distrust in their relationship -- this is greatly impacting her mental health presently.  Breast Cancer: Reports diagnosis in 2022 after finding a mass in her breast. She is officially in remission.  Followed by her oncologist, next appointment is 10/25. She will be able to restart mammograms this year.  States she was treated with chemotherapy, radiation therapy, and a bilateral mastectomy. She also has had a complete hysterectomy. Results of treatment include induction of diabetes, heart palpitations, and neuropathy of her legs.  Reports undergoing reconstruction surgery in July, and will be undergoing another at the end of November/beginning of December.   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 Describes episodes where legs will not work.  Notes over time periods between episodes has increased and her symptoms are improving gradually. Reports she was seen by neurology on 10/02 for an EMG/NCS which resulted normal. She will follow up with them in 3 months to complete testing on the other leg. She is taking gabapentin 300 mg three times daily.  Heart Palpitations: Managed with 25 mg Metoprolol succinate daily. States it works well. She was taking ivabradine 2.5 mg twice daily but she ran out about a month ago - this was very expensive for her and she is not sure it was really providing much benefit now that she has been off of it. Reports experiencing frequent heart palpitations.  States that before cancer she would also faint, describing a warped time sensation before fainting. Notes that she's experienced palpitations a few times after remission as well.    Diabetes: Induced by chemotherapy.  Managed with 1000 mg Metformin BID, previously with insulin in the hospital twice when glucose was in the 500s. She admits that she only takes metformin some of the time, depending on how much she eats. Monitors glucose every couple of weeks as needed, if she knows she'll eat something that'll affect her blood sugar, or if she feels weak. Notes glucose tends to stay below 100. Checks every couple of weeks or when she's feeling weak. Lab Results  Component Value Date   HGBA1C 5.5 09/03/2023   Anxiety/Depression; Insomnia: Managed with Ativan, as needed during the day, 15 mg Remeron daily, 50 mg Topamax BID, and 1 mg Klonopin nightly.  Expresses that she doesn't feel like Remeron is managing her symptoms well.  Reports that multiple nights she won't fall asleep, even after her medication.  Notes that she has also tried 3 Tylenol PM and still didn't fall asleep.  Prior medication: Wellbutrin - years ago, stopped taking after symptoms improved.   Reports that anxiety and depression both affect her equally.  Notes that she is currently dealing with a lot of stress in her life, with her health, marriage, and family.  States that she is not currently established with counseling, and has tried 3 counselors previously; hasn't talked to anybody else.  Expresses that she cries everyday.  Denies current SI/HI.  Constipation: Reports she hasn't had a bowel movement in 2 weeks.  Notes she is out of her Phentermine, is not taking Tramadol, and does not take Zofran regularly. States that she has tried fiber, shredded wheat, and  Miralax.  Has first ever colonoscopy scheduled next month.  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   Shortness of breath on exertion     Social History   Tobacco Use   Smoking status: Never   Smokeless tobacco: Never  Vaping Use    Vaping status: Never Used  Substance Use Topics   Alcohol use: Yes    Alcohol/week: 2.0 standard drinks of alcohol    Types: 2 Standard drinks or equivalent per week    Comment: rarely   Drug use: No   Past Surgical History:  Procedure Laterality Date   ABDOMINAL HYSTERECTOMY     BREAST RECONSTRUCTION Bilateral    BROW LIFT     CESAREAN SECTION     CHOLECYSTECTOMY     MASTECTOMY Bilateral 02/07/2022   WRIST SURGERY Right    Family History  Problem Relation Age of Onset   Diabetes Mother    Cancer Mother    Breast cancer Mother    Ovarian cancer Mother    Hypertension Mother    Hyperlipidemia Mother    Depression Mother    Sleep apnea Mother    Melanoma Father    Hypertension Father    Hyperlipidemia Father    Diabetes Father    Cancer Father    Sleep apnea Father    Hypertension Sister    Depression Sister    Hypertension Sister    Heart disease Sister    Depression Sister    Heart attack Maternal Grandmother    Breast cancer Paternal Grandmother    No Known Allergies Current Medications:   Current Outpatient Medications:    albuterol (VENTOLIN HFA) 108 (90 Base) MCG/ACT inhaler, Inhale into the lungs., Disp: , Rfl:    clonazePAM (KLONOPIN) 1 MG disintegrating tablet, Take by mouth., Disp: , Rfl:    cyclobenzaprine (FLEXERIL) 10 MG tablet, Take 1 tablet (10 mg total) by mouth 3 (three) times daily as needed for muscle spasms., Disp: 90 tablet, Rfl: 0   gabapentin (NEURONTIN) 300 MG capsule, Take 300 mg by mouth 3 (three) times daily., Disp: , Rfl:    Ibuprofen-Acetaminophen (DUAL ACTION PAIN RELIEF PO), Take by mouth., Disp: , Rfl:    LORazepam (ATIVAN) 1 MG tablet, Take 0.5-1 tablets (0.5-1 mg total) by mouth at bedtime as needed. for sleep, Disp: 30 tablet, Rfl: 4   metFORMIN (GLUCOPHAGE) 1000 MG tablet, Take 1,000 mg by mouth 2 (two) times daily with a meal., Disp: , Rfl:    metoprolol succinate (TOPROL-XL) 25 MG 24 hr tablet, Take 25 mg by mouth daily., Disp:  , Rfl:    mirtazapine (REMERON) 15 MG tablet, Take 15 mg by mouth at bedtime., Disp: , Rfl:    naproxen (NAPROSYN) 500 MG tablet, Take 1 tablet (500 mg total) by mouth 2 (two) times daily with a meal., Disp: 30 tablet, Rfl: 0   nitrofurantoin, macrocrystal-monohydrate, (MACROBID) 100 MG capsule, TAKE 1 CAPSULE BY MOUTH EVERYDAY AT BEDTIME, Disp: 30 capsule, Rfl: 0   ondansetron (ZOFRAN-ODT) 4 MG disintegrating tablet, PLEASE SEE ATTACHED FOR DETAILED DIRECTIONS, Disp: , Rfl:    phentermine 15 MG capsule, Take 15 mg by mouth every morning., Disp: , Rfl:    topiramate (TOPAMAX) 50 MG tablet, TAKE 1 TABLET BY MOUTH TWICE A DAY, Disp: 180 tablet, Rfl: 1   traMADol (ULTRAM) 50 MG tablet, Take 50 mg by  mouth every 12 (twelve) hours as needed., Disp: , Rfl:    zolpidem (AMBIEN) 5 MG tablet, Take 1 tablet (5 mg total) by mouth at bedtime as needed for sleep., Disp: 30 tablet, Rfl: 0   docusate sodium (COLACE) 100 MG capsule, Take 100 mg by mouth 2 (two) times daily. (Patient not taking: Reported on 09/03/2023), Disp: , Rfl:    ivabradine (CORLANOR) 5 MG TABS tablet, Take 2.5 mg by mouth 2 (two) times daily with a meal. (Patient not taking: Reported on 09/03/2023), Disp: , Rfl:   Review of Systems:   ROS See pertinent positives and negatives as per the HPI.  Vitals:   Vitals:   09/03/23 1028  BP: 136/80  Pulse: (!) 104  Temp: 97.7 F (36.5 C)  TempSrc: Temporal  SpO2: 97%  Weight: 169 lb 6.1 oz (76.8 kg)  Height: 5' 2.5" (1.588 m)     Body mass index is 30.49 kg/m.  Physical Exam:   Physical Exam Vitals and nursing note reviewed.  Constitutional:      General: She is not in acute distress.    Appearance: She is well-developed. She is not ill-appearing or toxic-appearing.  Cardiovascular:     Rate and Rhythm: Normal rate and regular rhythm.     Pulses: Normal pulses.     Heart sounds: Normal heart sounds, S1 normal and S2 normal.  Pulmonary:     Effort: Pulmonary effort is normal.      Breath sounds: Normal breath sounds.  Skin:    General: Skin is warm and dry.  Neurological:     Mental Status: She is alert.     GCS: GCS eye subscore is 4. GCS verbal subscore is 5. GCS motor subscore is 6.  Psychiatric:        Mood and Affect: Mood is anxious and depressed. Affect is tearful.        Speech: Speech normal.        Behavior: Behavior normal. Behavior is cooperative.     Assessment and Plan:   Type 2 diabetes mellitus with other specified complication, unspecified whether long term insulin use (HCC); Diabetes mellitus treated with oral medication (HCC) Update A1c She would be a great candidate for GLP-1 especially to help with weight loss as I would like to get her to stop taking phentermine We are planning to transition to this medication after we get her severe insomnia and mental health issues under better control Close follow-up in 1-2 weeks  Adjustment insomnia Uncontrolled Recommend holding nightly Klonopin and starting Ambien 5-10 mg nightly My hope is that improving her insomnia will help her mood Close follow-up in 1-2 weeks Consider urgent psychiatry referral if unable to get sleep under better control  Neuropathy Reviewed Continue management per neurology  Anxiety; Depression, major, single episode, moderate (HCC) Uncontrolled Her biggest complaint is not sleeping I'd like to trial Ambien and then get her back in our office in 1-2 weeks I'd like to de-prescribe her benzodiazepines, Remeron, topamax, phentermine and trial SSRI vs Wellbutrin vs Trintellix vs urgent psychiatry referral  Constipation, unspecified constipation type Uncontrolled Recommend consistent use of miralax daily, consider increasing by 1 capful every 3 days as needed to have regular bowel movements  Close follow-up for colonoscopy next month  Palpitations Will re-connect her with Dr Izora Ribas for further evaluation as she states she has had damage to her heart from  chemotherapy - I do not have this documentation  Malignant neoplasm of right breast in female, estrogen  receptor negative, unspecified site of breast (HCC)  Management per onc  I,Emily Lagle,acting as a scribe for [employer], PA.,have documented all relevant documentation on the behalf of Jarold Motto, PA,as directed by  Jarold Motto, PA while in the presence of Jarold Motto, [place].  I, Jarold Motto, [place], have reviewed all documentation for this visit. The documentation on 09/03/23 for the exam, diagnosis, procedures, and orders are all accurate and complete.  I spent a total of 52 minutes on this visit, today 09/03/23, which included reviewing previous notes from oncology, neurology and cardiology, ordering tests, discussing plan of care with patient and using shared-decision making on next steps, refilling medications, and documenting the findings in the note.   Jarold Motto, PA-C

## 2023-09-03 NOTE — Patient Instructions (Addendum)
It was great to see you!  Recommend getting back to cardiology I will place referral  I'm going to look around for some good therapy options for you  Start Ambien 5 mg nightly - do not take with Klonopin May take 10 mg if needed  Let's follow-up in 1-2 weeks, sooner if you have concerns.  Take care,  Jarold Motto PA-C

## 2023-09-04 ENCOUNTER — Encounter: Payer: Self-pay | Admitting: Physician Assistant

## 2023-09-09 ENCOUNTER — Encounter: Payer: Self-pay | Admitting: Physician Assistant

## 2023-09-12 NOTE — Telephone Encounter (Signed)
Paperwork given to [facility] to complete.

## 2023-09-14 NOTE — Progress Notes (Unsigned)
Cardiology Office Note:    Date:  09/16/2023  ID:  Carrie Richards, DOB October 04, 1971, MRN [id number] PCP: Jarold Motto, PA  [facility] Providers Cardiologist:  Christell Constant, MD       Patient Profile:      Syncope TTE 12/03/20: EF 60-65, no RWMA, NL RVSF, trivial MR, RAP 3  TTE 07/24/21 ([facility]): mild LVH, EF 66, Gr 1 DD, NL RVSF, trivial MR Monitor 12/2020: occasional asymptomatic SVT (13 beats) Carotid US 12/04/20: No ICA stenosis Chest pain  ETT 12/29/20: Ex 6', 7 METs, no ischemia Hypertension  Hyperlipidemia  Diabetes mellitus  Breast CA          History of Present Illness:  Discussed the use of AI scribe software for clinical note transcription with the patient, who gave verbal consent to proceed.  Carrie Richards is a 52 y.o. female who returns for evaluation of palpitations. She was last seen by Dr. Izora Ribas in 01/2021.   She is here alone. She presents with recurrent episodes of syncope and palpitations. The patient's cardiac workup in 2022, including an echocardiogram, event monitor, and carotid ultrasound, was unremarkable. She moved to [place], [place] after her last visit here. In July 2022, the patient was diagnosed with breast cancer and underwent chemotherapy, radiation, and a double mastectomy at [facility] in [place]. During the course of cancer treatment, the patient experienced an increase in heart palpitations and recurrent syncope. The patient's primary care physician initiated metoprolol for tachycardia, which the patient reports has not completely controlled her symptoms. The patient also saw a cardiologist in [place], who prescribed Ivabridine. She does not feel that this has helped and it is too expensive.   The patient's syncope episodes are typically preceded by a rapid heartbeat and chest tightness, and occur several months apart. The patient has visual changes (prodrome) before losing consciousness. The patient also experiences frequent  episodes of tachycardia, regardless of activity level or body position. The patient reports shortness of breath, particularly when exerting herself or standing up quickly, and has to use three pillows for sleep due to orthopnea. The patient also reports occasional leg swelling and recent weight gain.  SHx: She does not smoke or use drugs. She drinks occasional ETOH FHx: MGM - MI; Mother w ?AFib; Sis w tachycardia; no sudden death      Review of Systems  Constitutional: Positive for weight gain.     See HPI     Studies Reviewed:   EKG Interpretation Date/Time:  Monday September 16 2023 08:40:23 EDT Ventricular Rate:  92 PR Interval:  146 QRS Duration:  68 QT Interval:  352 QTC Calculation: 435 R Axis:   57  Text Interpretation: Normal sinus rhythm Normal ECG Confirmed by Tereso Newcomer [phone]) on 09/16/2023 8:51:57 AM             Risk Assessment/Calculations:         STOP-Bang Score:  3      Physical Exam:   VS:  BP 120/80   Pulse 92   Ht 5\' 2"  (1.575 m)   Wt 169 lb 12.8 oz (77 kg)   LMP  (LMP Unknown)   SpO2 97%   BMI 31.06 kg/m    Wt Readings from Last 3 Encounters:  09/16/23 169 lb 12.8 oz (77 kg)  09/03/23 169 lb 6.1 oz (76.8 kg)  02/17/21 182 lb (82.6 kg)    Constitutional:      Appearance: Healthy appearance. Not in distress.  Neck:  Vascular: JVD normal.  Pulmonary:     Breath sounds: Normal breath sounds. No wheezing. No rales.  Cardiovascular:     Normal rate. Regular rhythm.     Murmurs: There is no murmur.  Edema:    Peripheral edema absent.  Abdominal:     Palpations: Abdomen is soft.        Assessment and Plan:   Assessment & Plan Syncope, unspecified syncope type History of recurrent syncope associated with rapid heart rate and chest tightness. Previous event monitor showed brief episodes of SVT. She completed chemotherapy for breast cancer in 01/2022. She had an admission during her treatment with elevated HR. Records are limited in  CareEverywhere.  She had severe CTs that rule out PE. She had an Echo in 06/2021 with normal EF. She did see a cardiologist a couple of times. As noted, she has not had improvement with Ivabradine. It is very expensive. She does not really have symptoms of POTS. She gets lightheaded with standing but her tachycardia can occur at rest or with standing. Her syncope always occurs with preceding tachycardia +/- chest pain and happens when she is upright. I was able to see that she was on Carboplatin/Paclitaxel for her chemotherapy. In my review, it looks like there is a chance this combination can be cardiotoxic.  -30 day event monitor to r/o arrhythmia -Echocardiogram to r/o structural heart disease -If EF down, consider cardiac cath -If EF normal, arrange Myocardial PET to r/o ischemia (give chest pain) -TSH, BNP -No driving for 6 mos after last episode of syncope -Increase Toprol XL to 25 mg twice daily -DC Ivabradine  -Get records from prior PCP, Cardiologist  -If workup unrevealing and syncope continues, consider referral to EP for ILR -F/u 2 mos Shortness of breath She notes orthopnea and leg edema. However, physical exam does not suggest overt heart failure. -Order BNP  -Order echocardiogram to assess EF -With chest pain, if EF normal get Myocardial PET. If EF down, consider cardiac cath. Snoring Reports symptoms suggestive of sleep apnea, including snoring, daytime fatigue, and waking up gasping for air. -Order home sleep study to evaluate for sleep apnea.  Precordial chest pain As noted, if EF down, consider cardiac catheterization. If EF normal, will get Myocardial PET. Risks and benefits already discussed.      Dispo:  Return in about 2 months (around 11/16/2023) for Follow up after testing, w/ Dr. Izora Ribas, or Tereso Newcomer, PA-C.  Signed, Tereso Newcomer, PA-C

## 2023-09-15 ENCOUNTER — Encounter: Payer: Self-pay | Admitting: Physician Assistant

## 2023-09-16 ENCOUNTER — Encounter: Payer: Self-pay | Admitting: *Deleted

## 2023-09-16 ENCOUNTER — Encounter: Payer: Self-pay | Admitting: Physician Assistant

## 2023-09-16 ENCOUNTER — Telehealth: Payer: Self-pay | Admitting: *Deleted

## 2023-09-16 ENCOUNTER — Ambulatory Visit: Payer: No Typology Code available for payment source | Attending: Physician Assistant | Admitting: Physician Assistant

## 2023-09-16 VITALS — BP 120/80 | HR 92 | Ht 62.0 in | Wt 169.8 lb

## 2023-09-16 DIAGNOSIS — R072 Precordial pain: Secondary | ICD-10-CM | POA: Diagnosis not present

## 2023-09-16 DIAGNOSIS — R0602 Shortness of breath: Secondary | ICD-10-CM | POA: Diagnosis not present

## 2023-09-16 DIAGNOSIS — R002 Palpitations: Secondary | ICD-10-CM

## 2023-09-16 DIAGNOSIS — R55 Syncope and collapse: Secondary | ICD-10-CM

## 2023-09-16 DIAGNOSIS — R000 Tachycardia, unspecified: Secondary | ICD-10-CM

## 2023-09-16 DIAGNOSIS — R0683 Snoring: Secondary | ICD-10-CM

## 2023-09-16 MED ORDER — METOPROLOL SUCCINATE ER 25 MG PO TB24: 25.0000 mg | ORAL_TABLET | Freq: Two times a day (BID) | ORAL | 3 refills | Status: DC

## 2023-09-16 NOTE — Progress Notes (Signed)
Patient enrolled for [facility] [employer]/ [employer] to ship a 30 day cardiac event monitor to her address on file.  Dr. Izora Ribas to read.

## 2023-09-16 NOTE — Progress Notes (Signed)
Carrie Richards is a 52 y.o. female {ELNP/FU:31256} History of Present Illness:  No chief complaint on file.   HPI Insomnia; Anxiety/Depression: Managed with *** *** *** *** *** *** ***  Diabetes, type 2: {ELStarters:31163} *** *** *** *** *** ***    *** *** *** *** 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   Shortness of breath on exertion     Social History   Tobacco Use   Smoking status: Never   Smokeless tobacco: Never  Vaping Use   Vaping status: Never Used  Substance Use Topics   Alcohol use: Yes    Alcohol/week: 2.0 standard drinks of alcohol    Types: 2 Standard drinks or equivalent per week    Comment: rarely   Drug use: No   Past Surgical History:  Procedure Laterality Date   ABDOMINAL HYSTERECTOMY     BREAST RECONSTRUCTION Bilateral    BROW LIFT     CESAREAN SECTION     CHOLECYSTECTOMY     MASTECTOMY Bilateral 02/07/2022   WRIST SURGERY Right    Family History  Problem Relation Age of Onset   Diabetes Mother    Cancer Mother    Breast cancer Mother    Ovarian cancer Mother    Hypertension Mother    Hyperlipidemia Mother    Depression Mother    Sleep apnea Mother    Melanoma Father    Hypertension Father    Hyperlipidemia Father    Diabetes Father    Cancer Father    Sleep apnea Father    Hypertension Sister    Depression Sister    Hypertension Sister    Heart disease Sister    Depression Sister    Heart attack Maternal Grandmother    Breast cancer Paternal Grandmother    No Known Allergies Current Medications:   Current Outpatient Medications:    albuterol (VENTOLIN HFA) 108 (90 Base) MCG/ACT inhaler, Inhale into the lungs., Disp: , Rfl:    clonazePAM (KLONOPIN) 1 MG disintegrating tablet, Take by mouth., Disp: , Rfl:    cyclobenzaprine (FLEXERIL) 10 MG tablet, Take 10 mg by mouth daily., Disp: , Rfl:    docusate sodium (COLACE)  100 MG capsule, Take 100 mg by mouth as needed for mild constipation or moderate constipation., Disp: , Rfl:    gabapentin (NEURONTIN) 300 MG capsule, Take 300 mg by mouth 3 (three) times daily., Disp: , Rfl:    Ibuprofen-Acetaminophen (DUAL ACTION PAIN RELIEF PO), Take by mouth., Disp: , Rfl:    LORazepam (ATIVAN) 1 MG tablet, Take 0.5-1 tablets (0.5-1 mg total) by mouth at bedtime as needed. for sleep, Disp: 30 tablet, Rfl: 4   metoprolol succinate (TOPROL XL) 25 MG 24 hr tablet, Take 1 tablet (25 mg total) by mouth in the morning and at bedtime., Disp: 180 tablet, Rfl: 3   mirtazapine (REMERON) 15 MG tablet, Take 15 mg by mouth at bedtime., Disp: , Rfl:    nitrofurantoin, macrocrystal-monohydrate, (MACROBID) 100 MG capsule, TAKE 1 CAPSULE BY MOUTH EVERYDAY AT BEDTIME, Disp: 30 capsule, Rfl: 0   topiramate (TOPAMAX) 50 MG tablet, TAKE 1 TABLET BY MOUTH TWICE A DAY, Disp: 180 tablet, Rfl: 1   zolpidem (AMBIEN) 5 MG tablet, Take 1 tablet (5 mg total) by mouth at bedtime as needed for sleep., Disp: 30 tablet, Rfl: 0  Review of Systems:   ROS  See pertinent positives and negatives as per the HPI.  Vitals:   There were no vitals filed for this visit.   There is no height or weight on file to calculate BMI.  Physical Exam:   Physical Exam  Assessment and Plan:   There are no diagnoses linked to this encounter.            I,Emily Lagle,acting as a Neurosurgeon for [employer], PA.,have documented all relevant documentation on the behalf of Jarold Motto, PA,as directed by  Jarold Motto, PA while in the presence of Jarold Motto, [place].  *** (refresh reminder)  I, Jarold Motto, PA, have reviewed all documentation for this visit. The documentation on 09/16/23 for the exam, diagnosis, procedures, and orders are all accurate and complete.  Jarold Motto, PA-C

## 2023-09-16 NOTE — Patient Instructions (Signed)
Medication Instructions:  Your physician has recommended you make the following change in your medication:   INCREASE Toprol XL 25 to twice a day  STOP Ivabradine  *If you need a refill on your cardiac medications before your next appointment, please call your pharmacy*   Lab Work: TODAY:  PRO BNP & TSH  If you have labs (blood work) drawn today and your tests are completely normal, you will receive your results only by: MyChart Message (if you have MyChart) OR A paper copy in the mail If you have any lab test that is abnormal or we need to change your treatment, we will call you to review the results.   Testing/Procedures: Your physician has requested that you have an echocardiogram. Echocardiography is a painless test that uses sound waves to create images of your heart. It provides your doctor with information about the size and shape of your heart and how well your heart's chambers and valves are working. This procedure takes approximately one hour. There are no restrictions for this procedure. Please do NOT wear cologne, perfume, aftershave, or lotions (deodorant is allowed). Please arrive 15 minutes prior to your appointment time.   Your physician has recommended that you have a sleep study. This test records several body functions during sleep, including: brain activity, eye movement, oxygen and carbon dioxide blood levels, heart rate and rhythm, breathing rate and rhythm, the flow of air through your mouth and nose, snoring, body muscle movements, and chest and belly movement.   [employer] Cardiac Event Monitor Instructions  Your physician has requested you wear your cardiac event monitor for 30 days, (1-30). [employer] may call or text to confirm a shipping address. The monitor will be sent to a land address via UPS. [employer] will not ship a monitor to a PO BOX. It typically takes 3-5 days to receive your monitor after it has been enrolled. [employer] will assist with USPS  tracking if your package is delayed. The telephone number for [employer] is [phone]. Once you have received your monitor, please review the enclosed instructions. Instruction tutorials can also be viewed under help and settings on the enclosed cell phone. Your monitor has already been registered assigning a specific monitor serial # to you.  Billing and Self Pay Discount Information  [employer] has been provided the insurance information we had on file for you.  If your insurance has been updated, please call [employer] at [phone] to provide them with your updated insurance information.   [employer] offers a discounted Self Pay option for patients who have insurance that does not cover their cardiac event monitor or patients without insurance.  The discounted cost of a Self Pay Cardiac Event Monitor would be $225.00 , if the patient contacts [employer] at [phone] within 7 days of applying the monitor to make payment arrangements.  If the patient does not contact [employer] within 7 days of applying the monitor, the cost of the cardiac event monitor will be $350.00.  Applying the monitor  Remove cell phone from case and turn it on. The cell phone works as IT consultant and needs to be within [employer] of you at all times. The cell phone will need to be charged on a daily basis. We recommend you plug the cell phone into the enclosed charger at your bedside table every night.  Monitor batteries: You will receive two monitor batteries labelled #1 and #2. These are your recorders. Plug battery #2 onto the second connection on the enclosed charger. Keep one  battery on the charger at all times. This will keep the monitor battery deactivated. It will also keep it fully charged for when you need to switch your monitor batteries. A small light will be blinking on the battery emblem when it is charging. The light on the battery emblem will remain on when the battery is fully  charged.  Open package of a Monitor strip. Insert battery #1 into black hood on strip and gently squeeze monitor battery onto connection as indicated in instruction booklet. Set aside while preparing skin.  Choose location for your strip, vertical or horizontal, as indicated in the instruction booklet. Shave to remove all hair from location. There cannot be any lotions, oils, powders, or colognes on skin where monitor is to be applied. Wipe skin clean with enclosed Saline wipe. Dry skin completely.  Peel paper labeled #1 off the back of the Monitor strip exposing the adhesive. Place the monitor on the chest in the vertical or horizontal position shown in the instruction booklet. One arrow on the monitor strip must be pointing upward. Carefully remove paper labeled #2, attaching remainder of strip to your skin. Try not to create any folds or wrinkles in the strip as you apply it.  Firmly press and release the circle in the center of the monitor battery. You will hear a small beep. This is turning the monitor battery on. The heart emblem on the monitor battery will light up every 5 seconds if the monitor battery in turned on and connected to the patient securely. Do not push and hold the circle down as this turns the monitor battery off. The cell phone will locate the monitor battery. A screen will appear on the cell phone checking the connection of your monitor strip. This may read poor connection initially but change to good connection within the next minute. Once your monitor accepts the connection you will hear a series of 3 beeps followed by a climbing crescendo of beeps. A screen will appear on the cell phone showing the two monitor strip placement options. Touch the picture that demonstrates where you applied the monitor strip.  Your monitor strip and battery are waterproof. You are able to shower, bathe, or swim with the monitor on. They just ask you do not submerge deeper than 3 feet  underwater. We recommend removing the monitor if you are swimming in a lake, river, or ocean.  Your monitor battery will need to be switched to a fully charged monitor battery approximately once a week. The cell phone will alert you of an action which needs to be made.  On the cell phone, tap for details to reveal connection status, monitor battery status, and cell phone battery status. The green dots indicates your monitor is in good status. A red dot indicates there is something that needs your attention.  To record a symptom, click the circle on the monitor battery. In 30-60 seconds a list of symptoms will appear on the cell phone. Select your symptom and tap save. Your monitor will record a sustained or significant arrhythmia regardless of you clicking the button. Some patients do not feel the heart rhythm irregularities. [employer] will notify us of any serious or critical events.  Refer to instruction booklet for instructions on switching batteries, changing strips, the Do not disturb or Pause features, or any additional questions.  Call [employer] at [phone], to confirm your monitor is transmitting and record your baseline. They will answer any questions you may have regarding the monitor instructions  at that time.  Returning the monitor to [employer]  Place all equipment back into blue box. Peel off strip of paper to expose adhesive and close box securely. There is a prepaid UPS shipping label on this box. Drop in a UPS drop box, or at a UPS facility like [employer]. You may also contact [employer] to arrange UPS to pick up monitor package at your home.    Follow-Up: At [facility], you and your health needs are our priority.  As part of our continuing mission to provide you with exceptional heart care, we have created designated Provider Care Teams.  These Care Teams include your primary Cardiologist (physician) and Advanced Practice Providers (APPs -  Physician  Assistants and Nurse Practitioners) who all work together to provide you with the care you need, when you need it.  We recommend signing up for the patient portal called "MyChart".  Sign up information is provided on this After Visit Summary.  MyChart is used to connect with patients for Virtual Visits (Telemedicine).  Patients are able to view lab/test results, encounter notes, upcoming appointments, etc.  Non-urgent messages can be sent to your provider as well.   To learn more about what you can do with MyChart, go to [email or web address].    Your next appointment:   2 month(s)  Provider:   Christell Constant, MD     Other Instructions

## 2023-09-16 NOTE — Telephone Encounter (Signed)
Carrie Richards, [facility] ordered a Itamar sleep study for the pt.   Patient agreement reviewed and signed on 09/16/2023.  WatchPAT issued to patient on 09/16/2023 by Danielle Rankin, CMA. Patient aware to not open the WatchPAT box until contacted with the activation PIN. Patient profile initialized in CloudPAT on 09/16/2023 by Danielle Rankin, CMA. Device serial number: [id number]  Please list Reason for Call as Advice Only and type "WatchPAT issued to patient" in the comment box.

## 2023-09-17 ENCOUNTER — Encounter: Payer: Self-pay | Admitting: Physician Assistant

## 2023-09-17 ENCOUNTER — Ambulatory Visit: Payer: PRIVATE HEALTH INSURANCE | Admitting: Physician Assistant

## 2023-09-17 ENCOUNTER — Telehealth: Payer: Self-pay | Admitting: Physician Assistant

## 2023-09-17 VITALS — BP 120/84 | HR 95 | Temp 97.8°F | Ht 62.0 in | Wt 167.4 lb

## 2023-09-17 DIAGNOSIS — E119 Type 2 diabetes mellitus without complications: Secondary | ICD-10-CM | POA: Diagnosis not present

## 2023-09-17 DIAGNOSIS — F329 Major depressive disorder, single episode, unspecified: Secondary | ICD-10-CM

## 2023-09-17 DIAGNOSIS — Z124 Encounter for screening for malignant neoplasm of cervix: Secondary | ICD-10-CM

## 2023-09-17 DIAGNOSIS — Z23 Encounter for immunization: Secondary | ICD-10-CM

## 2023-09-17 DIAGNOSIS — Z7985 Long-term (current) use of injectable non-insulin antidiabetic drugs: Secondary | ICD-10-CM

## 2023-09-17 DIAGNOSIS — F5102 Adjustment insomnia: Secondary | ICD-10-CM | POA: Diagnosis not present

## 2023-09-17 DIAGNOSIS — Z1211 Encounter for screening for malignant neoplasm of colon: Secondary | ICD-10-CM

## 2023-09-17 LAB — TSH: TSH: 1.06 u[IU]/mL (ref 0.450–4.500)

## 2023-09-17 LAB — PRO B NATRIURETIC PEPTIDE: NT-Pro BNP: 40 pg/mL (ref 0–249)

## 2023-09-17 MED ORDER — QUETIAPINE FUMARATE 25 MG PO TABS: 25.0000 mg | ORAL_TABLET | Freq: Every day | ORAL | 0 refills | Status: DC

## 2023-09-17 MED ORDER — SERTRALINE HCL 25 MG PO TABS: 25.0000 mg | ORAL_TABLET | Freq: Every day | ORAL | 1 refills | Status: DC

## 2023-09-17 MED ORDER — ATORVASTATIN CALCIUM 20 MG PO TABS: 20.0000 mg | ORAL_TABLET | Freq: Every day | ORAL | 3 refills | Status: DC

## 2023-09-17 MED ORDER — OZEMPIC (0.25 OR 0.5 MG/DOSE) 2 MG/3ML ~~LOC~~ SOPN: 0.2500 mg | PEN_INJECTOR | SUBCUTANEOUS | 1 refills | Status: DC

## 2023-09-17 NOTE — Telephone Encounter (Signed)
Paperwork completed and given to patient at appt today.

## 2023-09-17 NOTE — Telephone Encounter (Signed)
Pt states medication was supposed to be ordered for Cholestrol but nothing was ordered. Please advise

## 2023-09-17 NOTE — Telephone Encounter (Signed)
Please see message and advise about Cholesterol medication. Pt is agreeable to start, see message from labs on 10/8.

## 2023-09-17 NOTE — Patient Instructions (Addendum)
It was great to see you!  Increase your Ambien to 10 mg daily -- if this is not helpful, on the next night, trial 25 mg seroquel. May increase seroquel by 25 mg every 2-3 nights to goal of 100 mg seroquel if needed  Talk therapy in the meantime for you -- this is VERY important -- please go to this  Stop Remeron  Start -- Zoloft 25 mg daily  Start Ozempic  0.25 mg weekly  Follow-up in 2-4 weeks, sooner if concerns  Take care,  Jarold Motto PA-C

## 2023-09-18 ENCOUNTER — Other Ambulatory Visit (HOSPITAL_COMMUNITY): Payer: Self-pay

## 2023-09-18 ENCOUNTER — Telehealth: Payer: Self-pay

## 2023-09-18 NOTE — Telephone Encounter (Signed)
Called [facility] pharmacy and spoke to [place] told her PA for Ozempic PA has been approved for one year. Macy verbalized understanding, will get it ready for her.

## 2023-09-18 NOTE — Telephone Encounter (Signed)
Spoke to pt told her PA for Ozempic has been approved and the pharmacy is getting it ready for you. Pt verbalized understanding.

## 2023-09-18 NOTE — Telephone Encounter (Signed)
Pharmacy Patient Advocate Encounter   Received notification from Physician's Office that prior authorization for Ozempic is required/requested.   Insurance verification completed.   The patient is insured through  [employer]  .   Per test claim: APPROVED from 10.23.24 to 10.23.25

## 2023-09-18 NOTE — Telephone Encounter (Signed)
Spoke to pt told her Carrie Richards sent Lipitor 20 mg one tablet daily to the pharmacy for you to take for your cholesterol. Pt verbalized understanding.

## 2023-09-19 ENCOUNTER — Telehealth: Payer: Self-pay | Admitting: Internal Medicine

## 2023-09-19 ENCOUNTER — Encounter: Payer: Self-pay | Admitting: Internal Medicine

## 2023-09-19 NOTE — Telephone Encounter (Signed)
Error

## 2023-09-24 ENCOUNTER — Ambulatory Visit (INDEPENDENT_AMBULATORY_CARE_PROVIDER_SITE_OTHER): Payer: PRIVATE HEALTH INSURANCE | Admitting: Physician Assistant

## 2023-09-24 ENCOUNTER — Encounter: Payer: Self-pay | Admitting: Physician Assistant

## 2023-09-24 ENCOUNTER — Telehealth: Payer: Self-pay | Admitting: *Deleted

## 2023-09-24 VITALS — BP 120/80 | HR 92 | Temp 97.8°F | Ht 62.0 in | Wt 162.2 lb

## 2023-09-24 DIAGNOSIS — R55 Syncope and collapse: Secondary | ICD-10-CM | POA: Diagnosis not present

## 2023-09-24 DIAGNOSIS — R059 Cough, unspecified: Secondary | ICD-10-CM | POA: Diagnosis not present

## 2023-09-24 DIAGNOSIS — R509 Fever, unspecified: Secondary | ICD-10-CM

## 2023-09-24 LAB — POCT RAPID STREP A (OFFICE): Rapid Strep A Screen: POSITIVE — AB

## 2023-09-24 LAB — POC INFLUENZA A&B (BINAX/QUICKVUE)
Influenza A, POC: NEGATIVE
Influenza B, POC: NEGATIVE

## 2023-09-24 MED ORDER — AZITHROMYCIN 250 MG PO TABS: ORAL_TABLET | ORAL | 0 refills | Status: AC

## 2023-09-24 MED ORDER — PREDNISONE 20 MG PO TABS: 40.0000 mg | ORAL_TABLET | Freq: Every day | ORAL | 0 refills | Status: DC

## 2023-09-24 MED ORDER — FLUCONAZOLE 150 MG PO TABS: ORAL_TABLET | ORAL | 0 refills | Status: DC

## 2023-09-24 NOTE — Telephone Encounter (Signed)
Left message on voicemail to call office. Pt needs to hold Seroquel while taking the Azithromycin per Lelon Mast.

## 2023-09-24 NOTE — Progress Notes (Signed)
Carrie Richards is a 52 y.o. female here for a new problem.  History of Present Illness:   Chief Complaint  Patient presents with   Cough    Pt c/o cough, body aches, fever off and on, headache, sore throat started 4 days ago. Home COVID test Neg.    HPI  Cough:  She complains of productive cough, sore throat, fever, chills, body aches, left ear pain,  and headaches starting 4 days ago.  Tested for Covid at home, result was negative.  She reports sleeping overall well since taking medications at nighttime.  She has been monitoring her temperature at home, reports her highest reading of 101.3  Fever is on and off, which she has been treating with dual action ibuprofen and acetaminophen.   Syncope: She reports a syncopal episode in the shower 2 days ago.  Took a hot bath before showering.  She called her husband due to lightheadedness, he was able to catch her before she hit the floor.  Afterwards, her husband and daughter placed ice in her in both axilla to help.  She is established and monitored for this by cardiology.  Last follow up was 09/16/2023 with Tereso Newcomer, PA-C.  She is currently being followed for syncope. Her heart monitor came in the mail last night, she plans to place her heart monitor today.  No hx of blood clots.    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   Shortness of breath on exertion      Social History   Tobacco Use   Smoking status: Never   Smokeless tobacco: Never  Vaping Use   Vaping status: Never Used  Substance Use Topics   Alcohol use: Yes    Alcohol/week: 2.0 standard drinks of alcohol    Types: 2 Standard drinks or equivalent per week    Comment: rarely   Drug use: No    Past Surgical History:  Procedure Laterality Date   ABDOMINAL HYSTERECTOMY     BREAST RECONSTRUCTION Bilateral    BROW LIFT     CESAREAN SECTION     CHOLECYSTECTOMY      MASTECTOMY Bilateral 02/07/2022   WRIST SURGERY Right     Family History  Problem Relation Age of Onset   Diabetes Mother    Cancer Mother    Breast cancer Mother    Ovarian cancer Mother    Hypertension Mother    Hyperlipidemia Mother    Depression Mother    Sleep apnea Mother    Melanoma Father    Hypertension Father    Hyperlipidemia Father    Diabetes Father    Cancer Father    Sleep apnea Father    Hypertension Sister    Depression Sister    Hypertension Sister    Heart disease Sister    Depression Sister    Heart attack Maternal Grandmother    Breast cancer Paternal Grandmother     No Known Allergies  Current Medications:   Current Outpatient Medications:    albuterol (VENTOLIN HFA) 108 (90 Base) MCG/ACT inhaler, Inhale into the lungs., Disp: , Rfl:    atorvastatin (LIPITOR) 20 MG tablet, Take 1 tablet (20 mg total) by mouth daily., Disp: 90 tablet, Rfl: 3   clonazePAM (KLONOPIN) 1 MG disintegrating tablet, Take by mouth., Disp: , Rfl:    cyclobenzaprine (FLEXERIL) 10 MG tablet, Take 10  mg by mouth daily., Disp: , Rfl:    docusate sodium (COLACE) 100 MG capsule, Take 100 mg by mouth as needed for mild constipation or moderate constipation., Disp: , Rfl:    gabapentin (NEURONTIN) 300 MG capsule, Take 300 mg by mouth 3 (three) times daily., Disp: , Rfl:    Ibuprofen-Acetaminophen (DUAL ACTION PAIN RELIEF PO), Take by mouth., Disp: , Rfl:    LORazepam (ATIVAN) 1 MG tablet, Take 0.5-1 tablets (0.5-1 mg total) by mouth at bedtime as needed. for sleep, Disp: 30 tablet, Rfl: 4   metoprolol succinate (TOPROL XL) 25 MG 24 hr tablet, Take 1 tablet (25 mg total) by mouth in the morning and at bedtime., Disp: 180 tablet, Rfl: 3   nitrofurantoin, macrocrystal-monohydrate, (MACROBID) 100 MG capsule, TAKE 1 CAPSULE BY MOUTH EVERYDAY AT BEDTIME, Disp: 30 capsule, Rfl: 0   QUEtiapine (SEROQUEL) 25 MG tablet, Take 1 tablet (25 mg total) by mouth at bedtime., Disp: 30 tablet, Rfl: 0    Semaglutide,0.25 or 0.5MG /DOS, (OZEMPIC, 0.25 OR 0.5 MG/DOSE,) 2 MG/3ML SOPN, Inject 0.25 mg into the skin once a week., Disp: 3 mL, Rfl: 1   sertraline (ZOLOFT) 25 MG tablet, Take 1 tablet (25 mg total) by mouth daily., Disp: 30 tablet, Rfl: 1   topiramate (TOPAMAX) 50 MG tablet, TAKE 1 TABLET BY MOUTH TWICE A DAY, Disp: 180 tablet, Rfl: 1   zolpidem (AMBIEN) 5 MG tablet, Take 1 tablet (5 mg total) by mouth at bedtime as needed for sleep., Disp: 30 tablet, Rfl: 0   Review of Systems:   ROS Negative unless otherwise specified per HPI.  Vitals:   Vitals:   09/24/23 1135  BP: 120/80  Pulse: 92  Temp: 97.8 F (36.6 C)  TempSrc: Temporal  SpO2: 97%  Weight: 162 lb 4 oz (73.6 kg)  Height: 5\' 2"  (1.575 m)     Body mass index is 29.68 kg/m.  Physical Exam:   Physical Exam Vitals and nursing note reviewed.  Constitutional:      General: She is not in acute distress.    Appearance: She is well-developed. She is not ill-appearing or toxic-appearing.  HENT:     Head: Normocephalic and atraumatic.     Right Ear: Tympanic membrane, ear canal and external ear normal. Tympanic membrane is not erythematous, retracted or bulging.     Left Ear: Tympanic membrane, ear canal and external ear normal. Tympanic membrane is not erythematous, retracted or bulging.     Nose: Nose normal.     Right Sinus: No maxillary sinus tenderness or frontal sinus tenderness.     Left Sinus: No maxillary sinus tenderness or frontal sinus tenderness.     Mouth/Throat:     Pharynx: Uvula midline. Posterior oropharyngeal erythema present.  Eyes:     General: Lids are normal.     Conjunctiva/sclera: Conjunctivae normal.  Neck:     Trachea: Trachea normal.  Cardiovascular:     Rate and Rhythm: Normal rate and regular rhythm.     Pulses: Normal pulses.     Heart sounds: Normal heart sounds, S1 normal and S2 normal.  Pulmonary:     Effort: Pulmonary effort is normal.     Breath sounds: Normal breath sounds. No  decreased air movement or transmitted upper airway sounds. No decreased breath sounds, wheezing, rhonchi or rales.  Lymphadenopathy:     Cervical: No cervical adenopathy.  Skin:    General: Skin is warm and dry.  Neurological:     Mental Status: She  is alert.     GCS: GCS eye subscore is 4. GCS verbal subscore is 5. GCS motor subscore is 6.  Psychiatric:        Speech: Speech normal.        Behavior: Behavior normal. Behavior is cooperative.    Results for orders placed or performed in visit on 09/24/23  POC Influenza A&B(BINAX/QUICKVUE)  Result Value Ref Range   Influenza A, POC Negative Negative   Influenza B, POC Negative Negative  POCT rapid strep A  Result Value Ref Range   Rapid Strep A Screen Positive (A) Negative    Assessment and Plan:   Fever, unspecified fever cause; Cough, unspecified type No red flags on exam.  Will initiate azithromycin per orders for strep and concern for early pneumonia. Discussed taking medications as prescribed.  I have also given her a prescription for oral prednisone if symptom(s) do not improve. Reviewed return precautions including worsening fever, SOB, worsening cough or other concerns. Push fluids and rest. I recommend that patient follow-up if symptoms worsen or persist despite treatment x 7-10 days, sooner if needed.  Vasovagal syncope She is currently having ongoing work-up for this with cardiology Recommended that she start wearing zio patch as prescribed and contact cardiology with new/worsening symptom(s) Wells Score is 0 Encouraged hydration, avoidance of hot showers  Low threshold to go to the emergency room if new/worsening/recurrence of symptom(s).   I, Isabelle Course, acting as a Neurosurgeon for Jarold Motto, [place]., have documented all relevant documentation on the behalf of Jarold Motto, [place], as directed by  Jarold Motto, PA while in the presence of Jarold Motto, [place].  I, Isabelle Course, have reviewed all documentation for  this visit. The documentation on 09/24/23 for the exam, diagnosis, procedures, and orders are all accurate and complete.  Jarold Motto, PA-C

## 2023-09-25 ENCOUNTER — Telehealth: Payer: Self-pay | Admitting: *Deleted

## 2023-09-25 NOTE — Telephone Encounter (Signed)
Thuy, Routt, LPN Phone Number: [phone]   Dr Bufford Buttner increased the amount to two at a time. The increase caused me to be out a week early. Dr Bufford Buttner asked about it yesterday.  Guaifien Codene 5ml for cough if possible.

## 2023-09-25 NOTE — Telephone Encounter (Signed)
Pt requesting refill for Ambien 5 mg twice a day, she said you increased the medication and she is out. Pt also asking for Rx Guaifenesin Codeine 5 ml for cough.

## 2023-09-25 NOTE — Telephone Encounter (Signed)
Message sent to [facility].

## 2023-09-26 ENCOUNTER — Encounter: Payer: Self-pay | Admitting: *Deleted

## 2023-09-26 MED ORDER — GUAIFENESIN-CODEINE 100-10 MG/5ML PO SOLN: 5.0000 mL | Freq: Three times a day (TID) | ORAL | 0 refills | Status: DC | PRN

## 2023-09-26 MED ORDER — ZOLPIDEM TARTRATE 10 MG PO TABS: 10.0000 mg | ORAL_TABLET | Freq: Every evening | ORAL | 1 refills | Status: DC | PRN

## 2023-09-26 NOTE — Addendum Note (Signed)
Addended by: Haynes Bast on: 09/26/2023 07:47 AM   Modules accepted: Orders

## 2023-09-27 HISTORY — PX: COLONOSCOPY: SHX174

## 2023-10-07 NOTE — Progress Notes (Signed)
Carrie Richards is a 52 y.o. female here for a follow-up of a pre-existing problem. History of Present Illness:  No chief complaint on file.  HPI Insomnia; Anxiety/Depression: Insomnia managed with 10 mg Ambien, increased from 5 on 10/22, and recommended to instead try 25 mg Seroquel if Ambien didn't work anymore.    Depression managed with 25 mg Zoloft since 10/22; previously managed with Remeron (reduced efficacy after continuous use) *** *** *** *** *** ***  Diabetes: Managed/Compliant with 0.25 mg Ozempic since 10/22 to assist with weight-loss.   {Monitoring:31320}  Diet ***  Exercise ***  Denies hypoglycemic or hyperglycemic episodes or symptoms.   Lab Results  Component Value Date   HGBA1C 5.5 09/03/2023   *** *** *** *** 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   Shortness of breath on exertion     Social History   Tobacco Use   Smoking status: Never   Smokeless tobacco: Never  Vaping Use   Vaping status: Never Used  Substance Use Topics   Alcohol use: Yes    Alcohol/week: 2.0 standard drinks of alcohol    Types: 2 Standard drinks or equivalent per week    Comment: rarely   Drug use: No   Past Surgical History:  Procedure Laterality Date   ABDOMINAL HYSTERECTOMY     BREAST RECONSTRUCTION Bilateral    BROW LIFT     CESAREAN SECTION     CHOLECYSTECTOMY     MASTECTOMY Bilateral 02/07/2022   WRIST SURGERY Right    Family History  Problem Relation Age of Onset   Diabetes Mother    Cancer Mother    Breast cancer Mother    Ovarian cancer Mother    Hypertension Mother    Hyperlipidemia Mother    Depression Mother    Sleep apnea Mother    Melanoma Father    Hypertension Father    Hyperlipidemia Father    Diabetes Father    Cancer Father    Sleep apnea Father    Hypertension Sister    Depression Sister    Hypertension Sister    Heart disease Sister     Depression Sister    Heart attack Maternal Grandmother    Breast cancer Paternal Grandmother    No Known Allergies Current Medications:   Current Outpatient Medications:    albuterol (VENTOLIN HFA) 108 (90 Base) MCG/ACT inhaler, Inhale into the lungs., Disp: , Rfl:    atorvastatin (LIPITOR) 20 MG tablet, Take 1 tablet (20 mg total) by mouth daily., Disp: 90 tablet, Rfl: 3   clonazePAM (KLONOPIN) 1 MG disintegrating tablet, Take by mouth., Disp: , Rfl:    cyclobenzaprine (FLEXERIL) 10 MG tablet, Take 10 mg by mouth daily., Disp: , Rfl:    docusate sodium (COLACE) 100 MG capsule, Take 100 mg by mouth as needed for mild constipation or moderate constipation., Disp: , Rfl:    fluconazole (DIFLUCAN) 150 MG tablet, Take 1 tablet by mouth. If symptoms persist, may take an additional tablet in 3-5 days. May repeat for a total of 3 tablets., Disp: 3 tablet, Rfl: 0   gabapentin (NEURONTIN) 300 MG capsule, Take 300 mg by mouth 3 (three) times daily., Disp: , Rfl:    guaiFENesin-codeine 100-10 MG/5ML syrup, Take 5 mLs by mouth 3 (three) times daily as needed for cough., Disp: 120 mL, Rfl: 0   Ibuprofen-Acetaminophen (DUAL  ACTION PAIN RELIEF PO), Take by mouth., Disp: , Rfl:    LORazepam (ATIVAN) 1 MG tablet, Take 0.5-1 tablets (0.5-1 mg total) by mouth at bedtime as needed. for sleep, Disp: 30 tablet, Rfl: 4   metoprolol succinate (TOPROL XL) 25 MG 24 hr tablet, Take 1 tablet (25 mg total) by mouth in the morning and at bedtime., Disp: 180 tablet, Rfl: 3   nitrofurantoin, macrocrystal-monohydrate, (MACROBID) 100 MG capsule, TAKE 1 CAPSULE BY MOUTH EVERYDAY AT BEDTIME, Disp: 30 capsule, Rfl: 0   predniSONE (DELTASONE) 20 MG tablet, Take 2 tablets (40 mg total) by mouth daily with breakfast., Disp: 10 tablet, Rfl: 0   QUEtiapine (SEROQUEL) 25 MG tablet, Take 1 tablet (25 mg total) by mouth at bedtime., Disp: 30 tablet, Rfl: 0   Semaglutide,0.25 or 0.5MG /DOS, (OZEMPIC, 0.25 OR 0.5 MG/DOSE,) 2 MG/3ML SOPN,  Inject 0.25 mg into the skin once a week., Disp: 3 mL, Rfl: 1   sertraline (ZOLOFT) 25 MG tablet, Take 1 tablet (25 mg total) by mouth daily., Disp: 30 tablet, Rfl: 1   topiramate (TOPAMAX) 50 MG tablet, TAKE 1 TABLET BY MOUTH TWICE A DAY, Disp: 180 tablet, Rfl: 1   zolpidem (AMBIEN) 10 MG tablet, Take 1 tablet (10 mg total) by mouth at bedtime as needed for sleep., Disp: 30 tablet, Rfl: 1  Review of Systems:   ROS See pertinent positives and negatives as per the HPI.  Vitals:   There were no vitals filed for this visit.   There is no height or weight on file to calculate BMI.  Physical Exam:   Physical Exam  Assessment and Plan:   There are no diagnoses linked to this encounter.            I,Emily Lagle,acting as a Neurosurgeon for [employer], PA.,have documented all relevant documentation on the behalf of Jarold Motto, PA,as directed by  Jarold Motto, PA while in the presence of Jarold Motto, [place].  *** (refresh reminder)  I, Jarold Motto, PA, have reviewed all documentation for this visit. The documentation on 10/07/23 for the exam, diagnosis, procedures, and orders are all accurate and complete.  Jarold Motto, PA-C

## 2023-10-08 ENCOUNTER — Encounter: Payer: Self-pay | Admitting: Physician Assistant

## 2023-10-08 ENCOUNTER — Ambulatory Visit (INDEPENDENT_AMBULATORY_CARE_PROVIDER_SITE_OTHER): Payer: Self-pay | Admitting: Physician Assistant

## 2023-10-08 VITALS — BP 100/70 | HR 85 | Temp 97.7°F | Ht 62.0 in | Wt 157.5 lb

## 2023-10-08 DIAGNOSIS — F5102 Adjustment insomnia: Secondary | ICD-10-CM

## 2023-10-08 DIAGNOSIS — E119 Type 2 diabetes mellitus without complications: Secondary | ICD-10-CM | POA: Diagnosis not present

## 2023-10-08 DIAGNOSIS — Z7985 Long-term (current) use of injectable non-insulin antidiabetic drugs: Secondary | ICD-10-CM | POA: Diagnosis not present

## 2023-10-08 DIAGNOSIS — F419 Anxiety disorder, unspecified: Secondary | ICD-10-CM

## 2023-10-08 DIAGNOSIS — G629 Polyneuropathy, unspecified: Secondary | ICD-10-CM

## 2023-10-08 DIAGNOSIS — L989 Disorder of the skin and subcutaneous tissue, unspecified: Secondary | ICD-10-CM

## 2023-10-08 DIAGNOSIS — F329 Major depressive disorder, single episode, unspecified: Secondary | ICD-10-CM

## 2023-10-08 DIAGNOSIS — E785 Hyperlipidemia, unspecified: Secondary | ICD-10-CM

## 2023-10-08 MED ORDER — OZEMPIC (0.25 OR 0.5 MG/DOSE) 2 MG/3ML ~~LOC~~ SOPN: 0.5000 mg | PEN_INJECTOR | SUBCUTANEOUS | 1 refills | Status: DC

## 2023-10-08 NOTE — Patient Instructions (Signed)
It was great to see you!  Decrease Topamax to half a pill nightly x 2 weeks, then stop  Increase Ozempic to 0.5 mg weekly  Let's follow-up in 1 month, sooner if you have concerns.  Take care,  Jarold Motto PA-C

## 2023-10-09 ENCOUNTER — Ambulatory Visit (AMBULATORY_SURGERY_CENTER): Payer: PRIVATE HEALTH INSURANCE

## 2023-10-09 ENCOUNTER — Other Ambulatory Visit: Payer: Self-pay | Admitting: Physician Assistant

## 2023-10-09 VITALS — Ht 62.0 in | Wt 157.0 lb

## 2023-10-09 DIAGNOSIS — Z1211 Encounter for screening for malignant neoplasm of colon: Secondary | ICD-10-CM

## 2023-10-09 MED ORDER — NA SULFATE-K SULFATE-MG SULF 17.5-3.13-1.6 GM/177ML PO SOLN
1.0000 | Freq: Once | ORAL | 0 refills | Status: AC
Start: 2023-10-09 — End: 2023-10-09

## 2023-10-09 NOTE — Progress Notes (Signed)
No egg or soy allergy known to patient  No issues known to pt with past sedation with any surgeries or procedures Patient denies ever being told they had issues or difficulty with intubation  No FH of Malignant Hyperthermia Pt is not on diet pills Weekly ozmepic injections instructions given to hold 7 days before procedure last dose will need to be on our before Monday 11/18 Pt is not on  home 02  Pt is not on blood thinners  Pt denies issues with constipation  No A fib or A flutter Have any cardiac testing pending- no  LOA: independent  Prep: suprep    Patient's chart reviewed by Cathlyn Parsons CNRA prior to previsit and patient appropriate for the [facility].  Previsit completed and red dot placed by patient's name on their procedure day (on provider's schedule).     PV competed with patient. Prep instructions sent via mychart and home address. Goodrx coupon for [facility] provided to use for price reduction if needed.

## 2023-10-14 ENCOUNTER — Encounter: Payer: Self-pay | Admitting: Physician Assistant

## 2023-10-14 ENCOUNTER — Ambulatory Visit (HOSPITAL_COMMUNITY): Payer: PRIVATE HEALTH INSURANCE | Attending: Cardiovascular Disease

## 2023-10-14 DIAGNOSIS — R002 Palpitations: Secondary | ICD-10-CM | POA: Insufficient documentation

## 2023-10-14 DIAGNOSIS — R0602 Shortness of breath: Secondary | ICD-10-CM | POA: Diagnosis not present

## 2023-10-14 DIAGNOSIS — R0683 Snoring: Secondary | ICD-10-CM | POA: Insufficient documentation

## 2023-10-14 DIAGNOSIS — R000 Tachycardia, unspecified: Secondary | ICD-10-CM | POA: Diagnosis present

## 2023-10-14 LAB — ECHOCARDIOGRAM COMPLETE
Area-P 1/2: 3.56 cm2
S' Lateral: 2.3 cm

## 2023-10-16 ENCOUNTER — Telehealth: Payer: Self-pay | Admitting: Physician Assistant

## 2023-10-16 NOTE — Telephone Encounter (Signed)
Patient following up on Itamar Study - per her insurance there has been no PA sent in for study. Patient has the device but needs activation PIN. Please advise patient on next steps. Will send staff message as well

## 2023-10-17 ENCOUNTER — Encounter: Payer: Self-pay | Admitting: Internal Medicine

## 2023-10-17 NOTE — Telephone Encounter (Signed)
.  Prior Authorization for [facility] sent to [facility] via web portal. Tracking Number NO PA REQ FOR [facility]-REF# CHRISTIE T. 10/17/23   Ordering provider: Tereso Newcomer Associated diagnoses: R00.2,R06.83,R00.0 WatchPAT PA obtained on 10/17/2023 by Latrelle Dodrill, CMA. Authorization: No; tracking ID NO PA REQ FOR [facility] Patient notified of PIN ([phone]) on 10/17/2023 via Notification Method: phone.

## 2023-10-18 ENCOUNTER — Encounter: Payer: Self-pay | Admitting: Physician Assistant

## 2023-10-19 ENCOUNTER — Encounter (INDEPENDENT_AMBULATORY_CARE_PROVIDER_SITE_OTHER): Payer: Self-pay | Admitting: Cardiology

## 2023-10-19 DIAGNOSIS — R0683 Snoring: Secondary | ICD-10-CM | POA: Diagnosis not present

## 2023-10-19 DIAGNOSIS — G4719 Other hypersomnia: Secondary | ICD-10-CM

## 2023-10-21 NOTE — Progress Notes (Unsigned)
[facility] History and Physical   Primary Care Physician:  Jarold Motto, [place]   Reason for Procedure:   CRCA screening  Plan:    colonoscopy     HPI: Carrie Richards is a 52 y.o. female here for a screening colonoscopy   Past Medical History:  Diagnosis Date   Anemia    Anxiety    Breast cancer (HCC)    Depression    H/O echocardiogram    TTE 10/14/2023: EF 60-65, no RWMA, GR 1 DD, GLS -22.4, normal RVSF, normal PASP, RVSP 15.4, RAP 3   Reactive depression 06/16/2017   Shortness of breath on exertion     Past Surgical History:  Procedure Laterality Date   ABDOMINAL HYSTERECTOMY     BREAST RECONSTRUCTION Bilateral    BROW LIFT     CESAREAN SECTION     CHOLECYSTECTOMY     MASTECTOMY Bilateral 02/07/2022   WRIST SURGERY Right     Prior to Admission medications   Medication Sig Start Date End Date Taking? Authorizing Provider  albuterol (VENTOLIN HFA) 108 (90 Base) MCG/ACT inhaler Inhale into the lungs. 10/26/22   [provider]  atorvastatin (LIPITOR) 20 MG tablet Take 1 tablet (20 mg total) by mouth daily. 09/17/23   Jarold Motto, PA  clonazePAM (KLONOPIN) 1 MG disintegrating tablet Take by mouth.    [provider]  cyclobenzaprine (FLEXERIL) 10 MG tablet Take 10 mg by mouth daily.    [provider]  docusate sodium (COLACE) 100 MG capsule Take 100 mg by mouth as needed for mild constipation or moderate constipation. 06/25/23   [provider]  fluconazole (DIFLUCAN) 150 MG tablet Take 1 tablet by mouth. If symptoms persist, may take an additional tablet in 3-5 days. May repeat for a total of 3 tablets. Patient not taking: Reported on 10/08/2023 09/24/23   Jarold Motto, PA  gabapentin (NEURONTIN) 300 MG capsule Take 300 mg by mouth 3 (three) times daily. 03/06/23   [provider]  Ibuprofen-Acetaminophen (DUAL ACTION PAIN RELIEF PO) Take by mouth.    [provider]  LORazepam (ATIVAN) 1 MG  tablet Take 0.5-1 tablets (0.5-1 mg total) by mouth at bedtime as needed. for sleep 01/22/20   Orland Mustard, MD  metoprolol succinate (TOPROL XL) 25 MG 24 hr tablet Take 1 tablet (25 mg total) by mouth in the morning and at bedtime. 09/16/23   Alben Spittle, Scott T, PA-C  nitrofurantoin, macrocrystal-monohydrate, (MACROBID) 100 MG capsule TAKE 1 CAPSULE BY MOUTH EVERYDAY AT BEDTIME 09/08/21   Ardith Dark, MD  predniSONE (DELTASONE) 20 MG tablet Take 2 tablets (40 mg total) by mouth daily with breakfast. Patient not taking: Reported on 10/08/2023 09/24/23   Jarold Motto, PA  QUEtiapine (SEROQUEL) 25 MG tablet TAKE 1 TABLET BY MOUTH EVERYDAY AT BEDTIME 10/09/23   Jarold Motto, PA  Semaglutide,0.25 or 0.5MG /DOS, (OZEMPIC, 0.25 OR 0.5 MG/DOSE,) 2 MG/3ML SOPN Inject 0.5 mg into the skin once a week. 10/08/23   Jarold Motto, PA  sertraline (ZOLOFT) 25 MG tablet TAKE 1 TABLET (25 MG TOTAL) BY MOUTH DAILY. 10/09/23   Jarold Motto, PA  topiramate (TOPAMAX) 50 MG tablet TAKE 1 TABLET BY MOUTH TWICE A DAY Patient taking differently: Take 50 mg by mouth daily. 03/17/21   Orland Mustard, MD  zolpidem (AMBIEN) 10 MG tablet Take 1 tablet (10 mg total) by mouth at bedtime as needed for sleep. 09/26/23 11/25/23  Jarold Motto, PA    Current Outpatient Medications  Medication Sig  Dispense Refill   albuterol (VENTOLIN HFA) 108 (90 Base) MCG/ACT inhaler Inhale into the lungs.     atorvastatin (LIPITOR) 20 MG tablet Take 1 tablet (20 mg total) by mouth daily. 90 tablet 3   gabapentin (NEURONTIN) 300 MG capsule Take 300 mg by mouth 3 (three) times daily.     LORazepam (ATIVAN) 1 MG tablet Take 0.5-1 tablets (0.5-1 mg total) by mouth at bedtime as needed. for sleep 30 tablet 4   metoprolol succinate (TOPROL XL) 25 MG 24 hr tablet Take 1 tablet (25 mg total) by mouth in the morning and at bedtime. 180 tablet 3   nitrofurantoin, macrocrystal-monohydrate, (MACROBID) 100 MG capsule TAKE 1 CAPSULE BY MOUTH  EVERYDAY AT BEDTIME 30 capsule 0   QUEtiapine (SEROQUEL) 25 MG tablet TAKE 1 TABLET BY MOUTH EVERYDAY AT BEDTIME 90 tablet 1   sertraline (ZOLOFT) 25 MG tablet TAKE 1 TABLET (25 MG TOTAL) BY MOUTH DAILY. 90 tablet 1   topiramate (TOPAMAX) 50 MG tablet TAKE 1 TABLET BY MOUTH TWICE A DAY (Patient taking differently: Take 50 mg by mouth daily.) 180 tablet 1   zolpidem (AMBIEN) 10 MG tablet Take 1 tablet (10 mg total) by mouth at bedtime as needed for sleep. 30 tablet 1   clonazePAM (KLONOPIN) 1 MG disintegrating tablet Take by mouth.     cyclobenzaprine (FLEXERIL) 10 MG tablet Take 10 mg by mouth daily.     docusate sodium (COLACE) 100 MG capsule Take 100 mg by mouth as needed for mild constipation or moderate constipation.     fluconazole (DIFLUCAN) 150 MG tablet Take 1 tablet by mouth. If symptoms persist, may take an additional tablet in 3-5 days. May repeat for a total of 3 tablets. 3 tablet 0   Ibuprofen-Acetaminophen (DUAL ACTION PAIN RELIEF PO) Take by mouth.     predniSONE (DELTASONE) 20 MG tablet Take 2 tablets (40 mg total) by mouth daily with breakfast. (Patient not taking: Reported on 10/08/2023) 10 tablet 0   Semaglutide,0.25 or 0.5MG /DOS, (OZEMPIC, 0.25 OR 0.5 MG/DOSE,) 2 MG/3ML SOPN Inject 0.5 mg into the skin once a week. 3 mL 1   Current Facility-Administered Medications  Medication Dose Route Frequency Provider Last Rate Last Admin   0.9 %  sodium chloride infusion  500 mL Intravenous Once Iva Boop, MD        Allergies as of 10/22/2023   (No Known Allergies)    Family History  Problem Relation Age of Onset   Diabetes Mother    Cancer Mother    Breast cancer Mother    Ovarian cancer Mother    Hypertension Mother    Hyperlipidemia Mother    Depression Mother    Sleep apnea Mother    Melanoma Father    Hypertension Father    Hyperlipidemia Father    Diabetes Father    Cancer Father    Sleep apnea Father    Hypertension Sister    Depression Sister     Hypertension Sister    Heart disease Sister    Depression Sister    Heart attack Maternal Grandmother    Breast cancer Paternal Grandmother    Colon cancer Neg Hx    Colon polyps Neg Hx    Esophageal cancer Neg Hx    Rectal cancer Neg Hx    Stomach cancer Neg Hx     Social History   Socioeconomic History   Marital status: Married    Spouse name: Thayer Ohm   Number of children: 5  Years of education: Not on file   Highest education level: Master's degree (e.g., MA, MS, MEng, MEd, MSW, MBA)  Occupational History   Occupation: [employer]  Tobacco Use   Smoking status: Never   Smokeless tobacco: Never  Vaping Use   Vaping status: Never Used  Substance and Sexual Activity   Alcohol use: Yes    Alcohol/week: 2.0 standard drinks of alcohol    Types: 2 Standard drinks or equivalent per week    Comment: rarely   Drug use: No   Sexual activity: Yes    Partners: Male    Birth control/protection: Surgical  Other Topics Concern   Not on file  Social History Narrative   Not on file   Social Determinants of Health   Financial Resource Strain: Low Risk  (09/23/2023)   Received [facility]   Overall Financial Resource Strain (CARDIA)    Difficulty of Paying Living Expenses: Not hard at all  Food Insecurity: No Food Insecurity (09/23/2023)   Received from [facility]   Hunger Vital Sign    Worried About Running Out of Food in the Last Year: Never true    Ran Out of Food in the Last Year: Never true  Transportation Needs: No Transportation Needs (09/23/2023)   Received from [facility] - Transportation    Lack of Transportation (Medical): No    Lack of Transportation (Non-Medical): No  Physical Activity: Unknown (09/16/2023)   Exercise Vital Sign    Days of Exercise per Week: Patient declined    Minutes of Exercise per Session: Not on file  Stress: Stress Concern Present (09/16/2023)   [employer] [facility] [employer] [facility] - Occupational Stress  Questionnaire    Feeling of Stress : Very much  Social Connections: Socially Integrated (09/23/2023)   Received from [facility]   Social Network    How would you rate your social network (family, work, friends)?: Good participation with social networks  Intimate Partner Violence: Not At Risk (09/23/2023)   Received from [facility]   HITS    Over the last 12 months how often did your partner physically hurt you?: Never    Over the last 12 months how often did your partner insult you or talk down to you?: Never    Over the last 12 months how often did your partner threaten you with physical harm?: Never    Over the last 12 months how often did your partner scream or curse at you?: Never    Review of Systems:  All other review of systems negative except as mentioned in the HPI.  Physical Exam: Vital signs BP 121/70   Pulse 96   Temp (!) 97.3 F (36.3 C)   Ht 5\' 2"  (1.575 m)   Wt 157 lb (71.2 kg)   LMP  (LMP Unknown)   SpO2 96%   BMI 28.72 kg/m   General:   Alert,  Well-developed, well-nourished, pleasant and cooperative in NAD Lungs:  Clear throughout to auscultation.   Heart:  Regular rate and rhythm; no murmurs, clicks, rubs,  or gallops. Abdomen:  Soft, nontender and nondistended. Normal bowel sounds.   Neuro/Psych:  Alert and cooperative. Normal mood and affect. A and O x 3   @Sherma Vanmetre  Sena Slate, MD, [facility] [phone] (pager) 10/22/2023 10:47 AM@

## 2023-10-22 ENCOUNTER — Ambulatory Visit: Payer: PRIVATE HEALTH INSURANCE | Admitting: Internal Medicine

## 2023-10-22 ENCOUNTER — Encounter: Payer: Self-pay | Admitting: Internal Medicine

## 2023-10-22 ENCOUNTER — Ambulatory Visit: Payer: PRIVATE HEALTH INSURANCE | Attending: Physician Assistant

## 2023-10-22 VITALS — BP 141/73 | HR 71 | Temp 97.3°F | Resp 12 | Ht 62.0 in | Wt 157.0 lb

## 2023-10-22 DIAGNOSIS — D123 Benign neoplasm of transverse colon: Secondary | ICD-10-CM

## 2023-10-22 DIAGNOSIS — R0683 Snoring: Secondary | ICD-10-CM

## 2023-10-22 DIAGNOSIS — Z1211 Encounter for screening for malignant neoplasm of colon: Secondary | ICD-10-CM

## 2023-10-22 DIAGNOSIS — R002 Palpitations: Secondary | ICD-10-CM

## 2023-10-22 DIAGNOSIS — R000 Tachycardia, unspecified: Secondary | ICD-10-CM

## 2023-10-22 MED ORDER — SODIUM CHLORIDE 0.9 % IV SOLN: 500.0000 mL | Freq: Once | INTRAVENOUS | Status: DC

## 2023-10-22 NOTE — Progress Notes (Signed)
Pt's states no medical or surgical changes since previsit or office visit. 

## 2023-10-22 NOTE — Progress Notes (Signed)
PT taken to PACU. Monitors in place. VSS. Report given to RN. 

## 2023-10-22 NOTE — Progress Notes (Signed)
Called to room to assist during endoscopic procedure.  Patient ID and intended procedure confirmed with present staff. Received instructions for my participation in the procedure from the performing physician.  

## 2023-10-22 NOTE — Patient Instructions (Addendum)
I found and removed one tiny polyp - all else ok.  I will let you know pathology results and when to have another routine colonoscopy by mail and/or My Chart.  I appreciate the opportunity to care for you. Iva Boop, MD, FACG   YOU HAD AN ENDOSCOPIC PROCEDURE TODAY AT THE [facility]:   Refer to the procedure report that was given to you for any specific questions about what was found during the examination.  If the procedure report does not answer your questions, please call your gastroenterologist to clarify.  If you requested that your care partner not be given the details of your procedure findings, then the procedure report has been included in a sealed envelope for you to review at your convenience later.  YOU SHOULD EXPECT: Some feelings of bloating in the abdomen. Passage of more gas than usual.  Walking can help get rid of the air that was put into your GI tract during the procedure and reduce the bloating. If you had a lower endoscopy (such as a colonoscopy or flexible sigmoidoscopy) you may notice spotting of blood in your stool or on the toilet paper. If you underwent a bowel prep for your procedure, you may not have a normal bowel movement for a few days.  Please Note:  You might notice some irritation and congestion in your nose or some drainage.  This is from the oxygen used during your procedure.  There is no need for concern and it should clear up in a day or so.  SYMPTOMS TO REPORT IMMEDIATELY:  Following lower endoscopy (colonoscopy or flexible sigmoidoscopy):  Excessive amounts of blood in the stool  Significant tenderness or worsening of abdominal pains  Swelling of the abdomen that is new, acute  Fever of 100F or higher  For urgent or emergent issues, a gastroenterologist can be reached at any hour by calling [phone]. Do not use MyChart messaging for urgent concerns.    DIET:  We do recommend a small meal at first, but then you may proceed to  your regular diet.  Drink plenty of fluids but you should avoid alcoholic beverages for 24 hours.  ACTIVITY:  You should plan to take it easy for the rest of today and you should NOT DRIVE or use heavy machinery until tomorrow (because of the sedation medicines used during the test).    FOLLOW UP: Our staff will call the number listed on your records the next business day following your procedure.  We will call around 7:15- 8:00 am to check on you and address any questions or concerns that you may have regarding the information given to you following your procedure. If we do not reach you, we will leave a message.     If any biopsies were taken you will be contacted by phone or by letter within the next 1-3 weeks.  Please call us at [phone] if you have not heard about the biopsies in 3 weeks.    SIGNATURES/CONFIDENTIALITY: You and/or your care partner have signed paperwork which will be entered into your electronic medical record.  These signatures attest to the fact that that the information above on your After Visit Summary has been reviewed and is understood.  Full responsibility of the confidentiality of this discharge information lies with you and/or your care-partner.

## 2023-10-22 NOTE — Progress Notes (Signed)
The sleep study is normal. There is no sign of sleep apnea. This is good news.  Tereso Newcomer, PA-C    10/22/2023 9:30 PM

## 2023-10-22 NOTE — Procedures (Signed)
   SLEEP STUDY REPORT Patient Information Study Date: 10/19/2023 Patient Name: Carrie Richards Patient ID: [id number] Birth Date: 06/09/1971 Age: 52 Gender: Female BMI: 31.2 (W=170 lb, H=5' 2'') Stopbang: 3 Referring Physician: Tereso Newcomer, PA  TEST DESCRIPTION: Home sleep apnea testing was completed using the WatchPat, a Type 1 device, utilizing  peripheral arterial tonometry (PAT), chest movement, actigraphy, pulse oximetry, pulse rate, body position and snore.  AHI was calculated with apnea and hypopnea using valid sleep time as the denominator. RDI includes apneas,  hypopneas, and RERAs. The data acquired and the scoring of sleep and all associated events were performed in  accordance with the recommended standards and specifications as outlined in the AASM Manual for the Scoring of  Sleep and Associated Events 2.2.0 (2015).   FINDINGS: 1. No evidence of Obstructive Sleep Apnea with AHI 2.5/hr.  2. No Central Sleep Apnea. 3. Oxygen desaturations as low as 87%. 4. Mild to moderate snoring was present. O2 sats were < 88% for 0.6 minutes. 5. Total sleep time was 7 hrs and 29 min. 6. 20.1% of total sleep time was spent in REM sleep.  7. Shortened sleep onset latency at 6 min.  8. Prolonged REM sleep onset latency at 198 min.  9. Total awakenings were 15.   DIAGNOSIS:  Normal study with no significant sleep disordered breathing.  RECOMMENDATIONS: 1. Normal study with no significant sleep disordered breathing.  2. Healthy sleep recommendations include: adequate nightly sleep (normal 7-9 hrs/night), avoidance of caffeine after  noon and alcohol near bedtime, and maintaining a sleep environment that is cool, dark and quiet.  3. Weight loss for overweight patients is recommended.   4. Snoring recommendations include: weight loss where appropriate, side sleeping, and avoidance of alcohol before  bed.  5. Operation of motor vehicle or dangerous equipment must be avoided when feeling  drowsy, excessively sleepy, or  mentally fatigued.   6. An ENT consultation which may be useful for specific causes of and possible treatment of bothersome snoring .   7. Weight loss may be of benefit in reducing the severity of snoring.   Signature: Armanda Magic, MD; [facility]; Diplomat, American Board of Sleep  Medicine Electronically Signed: 10/22/2023 6:01:04 PM

## 2023-10-22 NOTE — Op Note (Signed)
[facility] Patient Name: Carrie Richards Procedure Date: 10/22/2023 10:45 AM MRN: [id number] Endoscopist: Iva Boop , MD, [id number] Age: 52 Referring MD:  Date of Birth: [birth year] Gender: Female Account #: [id number] Procedure:                Colonoscopy Indications:              Screening for colorectal malignant neoplasm, This                            is the patient's first colonoscopy Medicines:                Monitored Anesthesia Care Procedure:                Pre-Anesthesia Assessment:                           - Prior to the procedure, a History and Physical                            was performed, and patient medications and                            allergies were reviewed. The patient's tolerance of                            previous anesthesia was also reviewed. The risks                            and benefits of the procedure and the sedation                            options and risks were discussed with the patient.                            All questions were answered, and informed consent                            was obtained. Prior Anticoagulants: The patient has                            taken no anticoagulant or antiplatelet agents. ASA                            Grade Assessment: II - A patient with mild systemic                            disease. After reviewing the risks and benefits,                            the patient was deemed in satisfactory condition to                            undergo the procedure.  After obtaining informed consent, the colonoscope                            was passed under direct vision. Throughout the                            procedure, the patient's blood pressure, pulse, and                            oxygen saturations were monitored continuously. The                            Olympus Scope SN [id number] was introduced through the                            anus and advanced to  the the cecum, identified by                            appendiceal orifice and ileocecal valve. The                            colonoscopy was performed without difficulty. The                            patient tolerated the procedure well. The quality                            of the bowel preparation was excellent. The                            ileocecal valve, appendiceal orifice, and rectum                            were photographed. The bowel preparation used was                            SUPREP via split dose instruction. Scope In: 10:56:24 AM Scope Out: 11:10:24 AM Scope Withdrawal Time: 0 hours 11 minutes 37 seconds  Total Procedure Duration: 0 hours 14 minutes 0 seconds  Findings:                 The perianal and digital rectal examinations were                            normal.                           A diminutive polyp was found in the transverse                            colon. The polyp was sessile. The polyp was removed                            with a cold snare. Resection and retrieval were  complete. Verification of patient identification                            for the specimen was done. Estimated blood loss was                            minimal.                           The exam was otherwise without abnormality on                            direct and retroflexion views. Complications:            No immediate complications. Estimated Blood Loss:     Estimated blood loss was minimal. Impression:               - One diminutive polyp in the transverse colon,                            removed with a cold snare. Resected and retrieved.                           - The examination was otherwise normal on direct                            and retroflexion views. Recommendation:           - Patient has a contact number available for                            emergencies. The signs and symptoms of potential                             delayed complications were discussed with the                            patient. Return to normal activities tomorrow.                            Written discharge instructions were provided to the                            patient.                           - Resume previous diet.                           - Continue present medications.                           - Repeat colonoscopy is recommended. The                            colonoscopy date will be determined after pathology  results from today's exam become available for                            review. Iva Boop, MD 10/22/2023 11:18:28 AM This report has been signed electronically.

## 2023-10-23 ENCOUNTER — Telehealth: Payer: Self-pay

## 2023-10-23 ENCOUNTER — Encounter: Payer: Self-pay | Admitting: *Deleted

## 2023-10-23 NOTE — Telephone Encounter (Signed)
  The sleep study is normal. There is no sign of sleep apnea. This is good news.  Tereso Newcomer, PA-C    10/22/2023 9:30 PM      Sent pt a Clinical cytogeneticist message with results.

## 2023-10-23 NOTE — Telephone Encounter (Signed)
  Follow up Call-     10/22/2023   10:05 AM  Call back number  Post procedure Call Back phone  # [phone]  Permission to leave phone message Yes     Patient questions:  Do you have a fever, pain , or abdominal swelling? No. Pain Score  0 *  Have you tolerated food without any problems? Yes.    Have you been able to return to your normal activities? Yes.    Do you have any questions about your discharge instructions: Diet   No. Medications  No. Follow up visit  No.  Do you have questions or concerns about your Care? No.  Actions: * If pain score is 4 or above: No action needed, pain <4.

## 2023-10-25 ENCOUNTER — Encounter: Payer: Self-pay | Admitting: Internal Medicine

## 2023-10-25 DIAGNOSIS — Z860101 Personal history of adenomatous and serrated colon polyps: Secondary | ICD-10-CM

## 2023-10-25 HISTORY — DX: Personal history of adenomatous and serrated colon polyps: Z86.0101

## 2023-10-25 LAB — SURGICAL PATHOLOGY

## 2023-10-30 ENCOUNTER — Ambulatory Visit: Payer: PRIVATE HEALTH INSURANCE | Attending: Physician Assistant

## 2023-10-30 DIAGNOSIS — R002 Palpitations: Secondary | ICD-10-CM

## 2023-10-30 DIAGNOSIS — R000 Tachycardia, unspecified: Secondary | ICD-10-CM

## 2023-10-30 DIAGNOSIS — R0683 Snoring: Secondary | ICD-10-CM

## 2023-11-05 NOTE — Progress Notes (Signed)
Carrie Richards is a 52 y.o. female {ELNP/FU:31256} History of Present Illness:   Health Maintenance Due  Topic Date Due   Diabetic kidney evaluation - Urine ACR  Never done   Hepatitis C Screening  Never done   No chief complaint on file.  HPI Insomnia; Anxiety/Depression: Insomnia managed with 10 mg Ambien daily and 25 mg Seroquel nightly.   Depression managed with 25 mg Zoloft daily.   Anxiety managed with Ativan and 50 mg Topamax daily   Denies SI/HI   Hypertension: Managed/Compliant with Metoprolol succinate 25 mg BID, morning and night.   {Monitoring:31320}  Diet *** Denies excessive caffeine intake, stimulant usage, excessive alcohol intake, or increase in salt consumption.  Exercise ***  Denies chest pain, shortness of breath, blurred vision, dizziness, unusual headaches, or lower leg swelling.   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/16/2017   Shortness of breath on exertion     Social History   Tobacco Use   Smoking status: Never   Smokeless tobacco: Never  Vaping Use   Vaping status: Never Used  Substance Use Topics   Alcohol use: Yes    Alcohol/week: 2.0 standard drinks of alcohol    Types: 2 Standard drinks or equivalent per week    Comment: rarely   Drug use: No   Past Surgical History:  Procedure Laterality Date   ABDOMINAL HYSTERECTOMY     BREAST RECONSTRUCTION Bilateral    BROW LIFT     CESAREAN SECTION     CHOLECYSTECTOMY     COLONOSCOPY  09/2023   MASTECTOMY Bilateral 02/07/2022   WRIST SURGERY Right    Family History  Problem Relation Age of Onset    Diabetes Mother    Cancer Mother    Breast cancer Mother    Ovarian cancer Mother    Hypertension Mother    Hyperlipidemia Mother    Depression Mother    Sleep apnea Mother    Melanoma Father    Hypertension Father    Hyperlipidemia Father    Diabetes Father    Cancer Father    Sleep apnea Father    Hypertension Sister    Depression Sister    Hypertension Sister    Heart disease Sister    Depression Sister    Heart attack Maternal Grandmother    Breast cancer Paternal Grandmother    Colon cancer Neg Hx    Colon polyps Neg Hx    Esophageal cancer Neg Hx    Rectal cancer Neg Hx    Stomach cancer Neg Hx    No Known Allergies Current Medications:   Current Outpatient Medications:    albuterol (VENTOLIN HFA) 108 (90 Base) MCG/ACT inhaler, Inhale into the lungs., Disp: , Rfl:    atorvastatin (LIPITOR) 20 MG tablet, Take 1 tablet (20 mg total) by mouth daily., Disp: 90 tablet, Rfl: 3   clonazePAM (KLONOPIN) 1 MG disintegrating tablet, Take by mouth., Disp: , Rfl:    cyclobenzaprine (FLEXERIL) 10 MG tablet, Take 10  mg by mouth daily., Disp: , Rfl:    docusate sodium (COLACE) 100 MG capsule, Take 100 mg by mouth as needed for mild constipation or moderate constipation., Disp: , Rfl:    fluconazole (DIFLUCAN) 150 MG tablet, Take 1 tablet by mouth. If symptoms persist, may take an additional tablet in 3-5 days. May repeat for a total of 3 tablets., Disp: 3 tablet, Rfl: 0   gabapentin (NEURONTIN) 300 MG capsule, Take 300 mg by mouth 3 (three) times daily., Disp: , Rfl:    Ibuprofen-Acetaminophen (DUAL ACTION PAIN RELIEF PO), Take by mouth., Disp: , Rfl:    LORazepam (ATIVAN) 1 MG tablet, Take 0.5-1 tablets (0.5-1 mg total) by mouth at bedtime as needed. for sleep, Disp: 30 tablet, Rfl: 4   metoprolol succinate (TOPROL XL) 25 MG 24 hr tablet, Take 1 tablet (25 mg total) by mouth in the morning and at bedtime., Disp: 180 tablet, Rfl: 3   nitrofurantoin, macrocrystal-monohydrate, (MACROBID)  100 MG capsule, TAKE 1 CAPSULE BY MOUTH EVERYDAY AT BEDTIME, Disp: 30 capsule, Rfl: 0   QUEtiapine (SEROQUEL) 25 MG tablet, TAKE 1 TABLET BY MOUTH EVERYDAY AT BEDTIME, Disp: 90 tablet, Rfl: 1   Semaglutide,0.25 or 0.5MG /DOS, (OZEMPIC, 0.25 OR 0.5 MG/DOSE,) 2 MG/3ML SOPN, Inject 0.5 mg into the skin once a week., Disp: 3 mL, Rfl: 1   sertraline (ZOLOFT) 25 MG tablet, TAKE 1 TABLET (25 MG TOTAL) BY MOUTH DAILY., Disp: 90 tablet, Rfl: 1   topiramate (TOPAMAX) 50 MG tablet, TAKE 1 TABLET BY MOUTH TWICE A DAY (Patient taking differently: Take 50 mg by mouth daily.), Disp: 180 tablet, Rfl: 1   zolpidem (AMBIEN) 10 MG tablet, Take 1 tablet (10 mg total) by mouth at bedtime as needed for sleep., Disp: 30 tablet, Rfl: 1  Review of Systems:   ROS See pertinent positives and negatives as per the HPI.  Vitals:   There were no vitals filed for this visit.   There is no height or weight on file to calculate BMI.  Physical Exam:   Physical Exam  Assessment and Plan:   There are no diagnoses linked to this encounter.            I,Emily Lagle,acting as a Neurosurgeon for [employer], PA.,have documented all relevant documentation on the behalf of Jarold Motto, PA,as directed by  Jarold Motto, PA while in the presence of Jarold Motto, [place].  *** (refresh reminder)  I, Jarold Motto, PA, have reviewed all documentation for this visit. The documentation on 11/05/23 for the exam, diagnosis, procedures, and orders are all accurate and complete.  Jarold Motto, PA-C

## 2023-11-07 ENCOUNTER — Encounter: Payer: Self-pay | Admitting: Physician Assistant

## 2023-11-07 ENCOUNTER — Ambulatory Visit (INDEPENDENT_AMBULATORY_CARE_PROVIDER_SITE_OTHER): Payer: No Typology Code available for payment source | Admitting: Physician Assistant

## 2023-11-07 VITALS — BP 110/80 | HR 92 | Temp 97.8°F | Ht 62.0 in | Wt 153.4 lb

## 2023-11-07 DIAGNOSIS — F329 Major depressive disorder, single episode, unspecified: Secondary | ICD-10-CM | POA: Diagnosis not present

## 2023-11-07 DIAGNOSIS — I10 Essential (primary) hypertension: Secondary | ICD-10-CM

## 2023-11-07 DIAGNOSIS — E119 Type 2 diabetes mellitus without complications: Secondary | ICD-10-CM

## 2023-11-07 DIAGNOSIS — F419 Anxiety disorder, unspecified: Secondary | ICD-10-CM

## 2023-11-07 DIAGNOSIS — F5102 Adjustment insomnia: Secondary | ICD-10-CM

## 2023-11-07 LAB — MICROALBUMIN / CREATININE URINE RATIO
Creatinine,U: 248.2 mg/dL
Microalb Creat Ratio: 0.6 mg/g (ref 0.0–30.0)
Microalb, Ur: 1.6 mg/dL (ref 0.0–1.9)

## 2023-11-07 MED ORDER — LORAZEPAM 1 MG PO TABS: 0.5000 mg | ORAL_TABLET | Freq: Every evening | ORAL | 1 refills | Status: AC | PRN

## 2023-11-07 MED ORDER — SERTRALINE HCL 50 MG PO TABS: 50.0000 mg | ORAL_TABLET | Freq: Every day | ORAL | 1 refills | Status: DC

## 2023-11-07 MED ORDER — SEMAGLUTIDE (1 MG/DOSE) 4 MG/3ML ~~LOC~~ SOPN: 1.0000 mg | PEN_INJECTOR | SUBCUTANEOUS | 1 refills | Status: DC

## 2023-11-07 NOTE — Patient Instructions (Addendum)
It was great to see you!  We will check your urine today  Increase Zoloft to 50 mg Increase Ozempic to 1 mg  Consider halfing/stopping seroquel  Let's follow-up in 3 months, sooner if you have concerns.  Take care,  Jarold Motto PA-C

## 2023-11-08 ENCOUNTER — Ambulatory Visit (INDEPENDENT_AMBULATORY_CARE_PROVIDER_SITE_OTHER): Payer: No Typology Code available for payment source | Admitting: Obstetrics and Gynecology

## 2023-11-08 ENCOUNTER — Encounter: Payer: Self-pay | Admitting: Obstetrics and Gynecology

## 2023-11-08 VITALS — BP 116/80 | HR 66 | Ht 62.75 in | Wt 152.0 lb

## 2023-11-08 DIAGNOSIS — Z1501 Genetic susceptibility to malignant neoplasm of breast: Secondary | ICD-10-CM

## 2023-11-08 DIAGNOSIS — Z7689 Persons encountering health services in other specified circumstances: Secondary | ICD-10-CM

## 2023-11-08 DIAGNOSIS — C50911 Malignant neoplasm of unspecified site of right female breast: Secondary | ICD-10-CM

## 2023-11-08 NOTE — Progress Notes (Signed)
   Acute Office Visit  Subjective:    Patient ID: Carrie Richards, female    DOB: 1971-10-26, 52 y.o.   MRN: [id number]   HPI 52 y.o. presents today for Gynecologic Exam (NEW GYN//establish care--Pt just wants to discuss, no exam, just had double mastectomy and hyst last year/P-06/11/17/M- July 2022 per pt/C-10/22/23) .  No LMP recorded (lmp unknown). Patient has had a hysterectomy.    Review of Systems     Objective:    OBGyn Exam  BP 116/80 (BP Location: Left Arm, Patient Position: Sitting, Cuff Size: Small)   Pulse 66   Ht 5' 2.75" (1.594 m)   Wt 152 lb (68.9 kg)   LMP  (LMP Unknown)   SpO2 99%   BMI 27.14 kg/m  Wt Readings from Last 3 Encounters:  11/08/23 152 lb (68.9 kg)  11/07/23 153 lb 6.1 oz (69.6 kg)  10/22/23 157 lb (71.2 kg)   Past Medical History:  Diagnosis Date   Anemia    Anxiety    Breast cancer (HCC)    tripple neg, chemo, radiation, b/l mastectomy follows in [facility]   Breast disorder    Depression    H/O echocardiogram    TTE 10/14/2023: EF 60-65, no RWMA, GR 1 DD, GLS -22.4, normal RVSF, normal PASP, RVSP 15.4, RAP 3   Hx of adenomatous polyp of colon 10/25/2023   Lymphedema    post chemo   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  Discussed option for conservative care for the cervix and low chance of cancer with current diagnosis of breast cancer.  Offered annual pap smear screening for reassurance, if she desires, but will support her decision if she decides on a a trachelectomy in the  future. She will sign for records at the front today RTC for annual exams 30 minutes spent on reviewing records, imaging,  and one on one patient time and counseling patient and documentation Dr. Judith Blonder

## 2023-11-13 ENCOUNTER — Ambulatory Visit: Payer: Self-pay | Admitting: Behavioral Health

## 2023-11-21 NOTE — Progress Notes (Deleted)
{This patient may be at risk for Amyloid. She has one or more dx on the problem list or PMH from the following list - Abnormal EKG, CHF, Aortic Stenosis, Proteinuria, LVH, Carpal Tunnel Syndrome, Biceps Tendon Rupture, Syncope. See list below or review PMH.  Diagnoses From Problem List           Noted     Vasovagal syncope 12/02/2020    Click HERE to open Cardiac Amyloid Screening SmartSet to order screening OR Click HERE to defer testing for 1 year or permanently :1}    Cardiology Office Note:    Date:  11/21/2023  ID:  Andria Meuse, DOB 1971-02-15, MRN [id number] PCP: Jarold Motto, PA  [facility] Providers Cardiologist:  Christell Constant, MD { Click to update primary MD,subspecialty MD or APP then REFRESH:1}    {Click to Open Review  :1}   Patient Profile:     *** Syncope TTE 12/03/20: EF 60-65, no RWMA, NL RVSF, trivial MR, RAP 3  TTE 07/24/21 ([facility]): mild LVH, EF 66, Gr 1 DD, NL RVSF, trivial MR Monitor 12/2020: occasional asymptomatic SVT (13 beats) Carotid US 12/04/20: No ICA stenosis TTE 10/14/2023: EF 60-65, no RWMA, GR 1 DD, GLS -22.4, normal RVSF, normal PASP, RVSP 15.4, RAP 3  Zio 10/2023: NSR, ST (max HR 172, Avg 93); 1 run NSVT (5 beats); no AF/Flutter; <1% PVCs/PACs; +Sx's w NSR, sinus tach; no malignant arrhythmias  Chest pain  ETT 12/29/20: Ex 6', 7 METs, no ischemia Hypertension  Hyperlipidemia  Diabetes mellitus  Breast CA       {      :1}   History of Present Illness:  Discussed the use of AI scribe software for clinical note transcription with the patient, who gave verbal consent to proceed.  Carrie Richards is a 52 y.o. female who returns for follow up of syncope, chest pain, dyspnea. She was last seen in 08/2023. She had moved back to [place] from [place]. While in [place], she was dx and treated for breast cancer. She had issues with tachycardia and syncope during her treatment. She had previously been placed on Ivabradine by a  cardiologist in [place]. This had not helped her symptoms. Ivabradine was DC'd. Metoprolol succinate was increased. NT Pro-BNP, TSH were both normal. Zio Monitor showed NSR, sinus tachy and 1 5 beat run of NSVT. TTE showed normal EF. Sleep study was normal (neg for OSA). Given normal EF, stress testing vs CCTA can be considered if chest pain continues.        ROS   See HPI ***    Studies Reviewed:       *** Results          Risk Assessment/Calculations:   {Does this patient have ATRIAL FIBRILLATION?[phone]} No BP recorded.  {Refresh Note OR Click here to enter BP  :1}***   STOP-Bang Score:  3  { Consider Dx Sleep Disordered Breathing or Sleep Apnea  ICD G47.33          :1}    Physical Exam:   VS:  LMP  (LMP Unknown)    Wt Readings from Last 3 Encounters:  11/08/23 152 lb (68.9 kg)  11/07/23 153 lb 6.1 oz (69.6 kg)  10/22/23 157 lb (71.2 kg)    Physical Exam***     Assessment and Plan:   Assessment & Plan Syncope, unspecified syncope type  Precordial chest pain  Tachycardia   Assessment and Plan          {  Syncope, unspecified syncope type History of recurrent syncope associated with rapid heart rate and chest tightness. Previous event monitor showed brief episodes of SVT. She completed chemotherapy for breast cancer in 01/2022. She had an admission during her treatment with elevated HR. Records are limited in [facility]where.  She had severe CTs that rule out PE. She had an Echo in 06/2021 with normal EF. She did see a cardiologist a couple of times. As noted, she has not had improvement with Ivabradine. It is very expensive. She does not really have symptoms of POTS. She gets lightheaded with standing but her tachycardia can occur at rest or with standing. Her syncope always occurs with preceding tachycardia +/- chest pain and happens when she is upright. I was able to see that she was on Carboplatin/Paclitaxel for her chemotherapy. In my review, it looks like there is  a chance this combination can be cardiotoxic.  -30 day event monitor to r/o arrhythmia -Echocardiogram to r/o structural heart disease -If EF down, consider cardiac cath -If EF normal, arrange Myocardial PET to r/o ischemia (give chest pain) -TSH, BNP -No driving for 6 mos after last episode of syncope -Increase Toprol XL to 25 mg twice daily -DC Ivabradine  -Get records from prior PCP, Cardiologist  -If workup unrevealing and syncope continues, consider referral to EP for ILR -F/u 2 mos Shortness of breath She notes orthopnea and leg edema. However, physical exam does not suggest overt heart failure. -Order BNP  -Order echocardiogram to assess EF -With chest pain, if EF normal get Myocardial PET. If EF down, consider cardiac cath. Snoring Reports symptoms suggestive of sleep apnea, including snoring, daytime fatigue, and waking up gasping for air. -Order home sleep study to evaluate for sleep apnea.  Precordial chest pain As noted, if EF down, consider cardiac catheterization. If EF normal, will get Myocardial PET. Risks and benefits already discussed.      :1}    {Are you ordering a CV Procedure (e.g. stress test, cath, DCCV, TEE, etc)?   Press F2        :[id number]}  Dispo:  No follow-ups on file.  Signed, Tereso Newcomer, PA-C

## 2023-11-22 ENCOUNTER — Ambulatory Visit: Payer: No Typology Code available for payment source | Attending: Physician Assistant | Admitting: Physician Assistant

## 2023-11-22 DIAGNOSIS — R55 Syncope and collapse: Secondary | ICD-10-CM

## 2023-11-22 DIAGNOSIS — R072 Precordial pain: Secondary | ICD-10-CM

## 2023-11-22 DIAGNOSIS — R000 Tachycardia, unspecified: Secondary | ICD-10-CM

## 2023-11-24 ENCOUNTER — Other Ambulatory Visit: Payer: Self-pay | Admitting: Physician Assistant

## 2023-11-24 ENCOUNTER — Encounter: Payer: Self-pay | Admitting: Physician Assistant

## 2023-11-24 MED ORDER — ZOLPIDEM TARTRATE 10 MG PO TABS: 10.0000 mg | ORAL_TABLET | Freq: Every evening | ORAL | 1 refills | Status: DC | PRN

## 2023-11-25 ENCOUNTER — Encounter: Payer: Self-pay | Admitting: Physician Assistant

## 2023-12-07 ENCOUNTER — Encounter: Payer: Self-pay | Admitting: Physician Assistant

## 2023-12-09 MED ORDER — NITROFURANTOIN MONOHYD MACRO 100 MG PO CAPS: 100.0000 mg | ORAL_CAPSULE | Freq: Every day | ORAL | 2 refills | Status: DC

## 2023-12-11 ENCOUNTER — Encounter: Payer: Self-pay | Admitting: Physician Assistant

## 2023-12-12 ENCOUNTER — Other Ambulatory Visit: Payer: Self-pay | Admitting: Physician Assistant

## 2023-12-12 DIAGNOSIS — C50911 Malignant neoplasm of unspecified site of right female breast: Secondary | ICD-10-CM

## 2023-12-12 DIAGNOSIS — M79622 Pain in left upper arm: Secondary | ICD-10-CM

## 2023-12-16 ENCOUNTER — Ambulatory Visit: Payer: Self-pay | Admitting: Physician Assistant

## 2023-12-16 NOTE — Telephone Encounter (Signed)
Patient has an OV on 12/17/2003 with PCP

## 2023-12-16 NOTE — Telephone Encounter (Signed)
Copied from CRM [id number]. Topic: Clinical - Red Word Triage >> Dec 16, 2023  1:29 PM Orinda Kenner C wrote: Red Word that prompted transfer to Nurse Triage: Patient's spouse Carrie Richards [phone] possible yeast infection, burning and painful when urinating, pressure and cut in vaginal area. Patient denies fever. Patient has pain in lower back and abdomen but unsure if it's related because she just had surgery.   Chief Complaint: urine odor  Symptoms: painful urination, cut on vaginal opening, lower back pain, abdominal pain Frequency: 2-3 days Pertinent Negatives: Patient denies urinary frequency Disposition: [] ED /[] Urgent Care (no appt availability in office) / [x] Appointment(In office/virtual)/ []  [facility]/ [] Home Care/ [] Refused Recommended Disposition /[] [facility]/ []  Follow-up with PCP Additional Notes: The patient's spouse called and reported that for the past 2-3 days the patient has experienced burning with urination and today she noticed foul smelling urine. She does have cut at her vaginal area due to intercourse as she has had dryness that she is working with her gynecologist to treat.  The patient recently had reconstructive surgery.  She is experiencing abdominal pain and lower back pain.  It is unclear if these symptoms are related to recent surgery or urinary symptoms. The patient has a history of cancer.  She was scheduled for a next day appointment for further assessment.  She was advised to go to urgent care if symptoms worsen prior to appointment.   Reason for Disposition  Side (flank) or lower back pain present  Answer Assessment - Initial Assessment Questions 1. SYMPTOM: "What's the main symptom you're concerned about?" (e.g., frequency, incontinence)     Burning with urination  Odor  Cut at vaginal opening 2. ONSET: "When did the  symptoms  start?"     2-3 days 3. PAIN: "Is there any pain?" If Yes, ask: "How bad is it?" (Scale: 1-10; mild, moderate,  severe)     Burning - speaking to husband  4. CAUSE: "What do you think is causing the symptoms?"     Possible uti  5. OTHER SYMPTOMS: "Do you have any other symptoms?" (e.g., blood in urine, fever, flank pain, pain with urination)     Recent reconstructive surgery unsure if back pain is due to surgery or uti  Protocols used: Urinary Symptoms-A-AH

## 2023-12-17 ENCOUNTER — Other Ambulatory Visit (HOSPITAL_COMMUNITY)
Admission: RE | Admit: 2023-12-17 | Discharge: 2023-12-17 | Disposition: A | Discharge status: Home / Self Care | Payer: No Typology Code available for payment source | Source: Ambulatory Visit | Attending: Physician Assistant | Admitting: Physician Assistant

## 2023-12-17 ENCOUNTER — Encounter: Payer: Self-pay | Admitting: Physician Assistant

## 2023-12-17 ENCOUNTER — Ambulatory Visit: Payer: No Typology Code available for payment source | Admitting: Physician Assistant

## 2023-12-17 VITALS — BP 102/70 | HR 91 | Temp 97.4°F | Ht 62.75 in | Wt 138.0 lb

## 2023-12-17 DIAGNOSIS — N898 Other specified noninflammatory disorders of vagina: Secondary | ICD-10-CM | POA: Insufficient documentation

## 2023-12-17 DIAGNOSIS — F329 Major depressive disorder, single episode, unspecified: Secondary | ICD-10-CM

## 2023-12-17 DIAGNOSIS — R300 Dysuria: Secondary | ICD-10-CM

## 2023-12-17 DIAGNOSIS — M79622 Pain in left upper arm: Secondary | ICD-10-CM

## 2023-12-17 DIAGNOSIS — F419 Anxiety disorder, unspecified: Secondary | ICD-10-CM

## 2023-12-17 DIAGNOSIS — E119 Type 2 diabetes mellitus without complications: Secondary | ICD-10-CM

## 2023-12-17 DIAGNOSIS — Z113 Encounter for screening for infections with a predominantly sexual mode of transmission: Secondary | ICD-10-CM | POA: Diagnosis not present

## 2023-12-17 DIAGNOSIS — Z7985 Long-term (current) use of injectable non-insulin antidiabetic drugs: Secondary | ICD-10-CM

## 2023-12-17 LAB — POCT URINALYSIS DIPSTICK
Bilirubin, UA: NEGATIVE
Blood, UA: 1
Glucose, UA: NEGATIVE
Ketones, UA: NEGATIVE
Leukocytes, UA: NEGATIVE
Nitrite, UA: NEGATIVE
Protein, UA: POSITIVE — AB
Spec Grav, UA: >=1.03 — AB (ref 1.010–1.025)
Urobilinogen, UA: 0.2 U/dL
pH, UA: 5 (ref 5.0–8.0)

## 2023-12-17 MED ORDER — CEPHALEXIN 500 MG PO CAPS: 500.0000 mg | ORAL_CAPSULE | Freq: Two times a day (BID) | ORAL | 0 refills | Status: DC

## 2023-12-17 NOTE — Progress Notes (Signed)
Carrie Richards is a 53 y.o. female here for a new problem.  History of Present Illness:   Chief Complaint  Patient presents with   Dysuria    PT c/o pain with urination x 3 days. Denies back pain, no fever or chills.   Dysuria  She complains today of experiencing pain when urinating that had persisted over the past 3 days.  She states that her symptoms feel as if she has a lesion in her vagina.  She has been using coconut oil ice cubes as a method of lubrication. She believes the cubes caused the tear as it was not completely rounded. She reports feeling uncomfortable when sitting and has been experiencing accompanying itchiness and minimal bleeding.  She's also experiencing hot flashes but is not interested in medications at this time.  She denies any back pain, fever, unusual discharge or chills.   Breast Cancer History  She recently had a concerning area on her left axilla that prompted her radiology team to further evaluate; she contacted her surgeon (she is seeing him Thursday) and plans to have this evaluated by them. Denies any overt concerns at this time  DM2 She reports compliance and good tolerance of Ozempic 1 mg once weekly.  Not interested in a dose increase at this time.   Anxiety and Depression This is ongoing She continues to have significant anxiety and depression regarding her marriage and children She is not getting relief of symptom(s) with current medication Currently taking Zoloft 50 mg daily, seroquel 25 mg nightly and Ambien 10 mg nightly. She has a very limited support system Ongoing significant crying spells Denies suicidal ideation   Past Medical History:  Diagnosis Date   Anemia    Anxiety    Breast cancer (HCC)    tripple neg, chemo, radiation, b/l mastectomy follows in [facility]   Breast disorder    Depression    H/O echocardiogram    TTE 10/14/2023: EF 60-65, no RWMA, GR 1 DD, GLS -22.4, normal RVSF, normal PASP, RVSP 15.4, RAP 3    Hx of adenomatous polyp of colon 10/25/2023   Lymphedema    post chemo   Reactive depression 06/16/2017   Shortness of breath on exertion      Social History   Tobacco Use   Smoking status: Never   Smokeless tobacco: Never  Vaping Use   Vaping status: Never Used  Substance Use Topics   Alcohol use: Yes    Alcohol/week: 2.0 standard drinks of alcohol    Types: 2 Standard drinks or equivalent per week    Comment: rarely   Drug use: No    Past Surgical History:  Procedure Laterality Date   ABDOMINAL HYSTERECTOMY     [facility] desires annual pap smears   BREAST RECONSTRUCTION Bilateral    BROW LIFT     CESAREAN SECTION     CHOLECYSTECTOMY     COLONOSCOPY  09/2023   MASTECTOMY Bilateral 02/07/2022   WRIST SURGERY Right     Family History  Problem Relation Age of Onset   Diabetes Mother    Cancer Mother    Breast cancer Mother    Ovarian cancer Mother    Hypertension Mother    Hyperlipidemia Mother    Depression Mother    Sleep apnea Mother    Melanoma Father    Hypertension Father    Hyperlipidemia Father    Diabetes Father    Cancer Father    Sleep apnea Father  Hypertension Sister    Depression Sister    Hypertension Sister    Heart disease Sister    Depression Sister    Heart attack Maternal Grandmother    Breast cancer Paternal Grandmother    Colon cancer Neg Hx    Colon polyps Neg Hx    Esophageal cancer Neg Hx    Rectal cancer Neg Hx    Stomach cancer Neg Hx     No Known Allergies  Current Medications:   Current Outpatient Medications:    albuterol (VENTOLIN HFA) 108 (90 Base) MCG/ACT inhaler, Inhale into the lungs., Disp: , Rfl:    atorvastatin (LIPITOR) 20 MG tablet, Take 1 tablet (20 mg total) by mouth daily., Disp: 90 tablet, Rfl: 3   cephALEXin (KEFLEX) 500 MG capsule, Take 1 capsule (500 mg total) by mouth 2 (two) times daily., Disp: 14 capsule, Rfl: 0   cyclobenzaprine (FLEXERIL) 10 MG tablet, Take 10 mg by mouth daily., Disp: , Rfl:     docusate sodium (COLACE) 100 MG capsule, Take 100 mg by mouth as needed for mild constipation or moderate constipation., Disp: , Rfl:    gabapentin (NEURONTIN) 300 MG capsule, Take 300 mg by mouth 3 (three) times daily., Disp: , Rfl:    Ibuprofen-Acetaminophen (DUAL ACTION PAIN RELIEF PO), Take by mouth., Disp: , Rfl:    LORazepam (ATIVAN) 1 MG tablet, Take 0.5-1 tablets (0.5-1 mg total) by mouth at bedtime as needed. for sleep, Disp: 30 tablet, Rfl: 1   metoprolol succinate (TOPROL XL) 25 MG 24 hr tablet, Take 1 tablet (25 mg total) by mouth in the morning and at bedtime., Disp: 180 tablet, Rfl: 3   nitrofurantoin, macrocrystal-monohydrate, (MACROBID) 100 MG capsule, Take 1 capsule (100 mg total) by mouth at bedtime., Disp: 30 capsule, Rfl: 2   QUEtiapine (SEROQUEL) 25 MG tablet, TAKE 1 TABLET BY MOUTH EVERYDAY AT BEDTIME, Disp: 90 tablet, Rfl: 1   Semaglutide, 1 MG/DOSE, 4 MG/3ML SOPN, Inject 1 mg as directed once a week., Disp: 3 mL, Rfl: 1   sertraline (ZOLOFT) 50 MG tablet, Take 1 tablet (50 mg total) by mouth daily., Disp: 90 tablet, Rfl: 1   zolpidem (AMBIEN) 10 MG tablet, Take 1 tablet (10 mg total) by mouth at bedtime as needed for sleep., Disp: 30 tablet, Rfl: 1   Review of Systems:   Review of Systems  Constitutional:  Negative for chills and fever.  Genitourinary:  Positive for dysuria.  Musculoskeletal:  Negative for back pain.    Vitals:   Vitals:   12/17/23 1020  BP: 102/70  Pulse: 91  Temp: (!) 97.4 F (36.3 C)  TempSrc: Temporal  SpO2: 99%  Weight: 138 lb (62.6 kg)  Height: 5' 2.75" (1.594 m)     Body mass index is 24.64 kg/m.  Physical Exam:   Physical Exam Vitals and nursing note reviewed. Exam conducted with a chaperone present.  Constitutional:      General: She is not in acute distress.    Appearance: She is well-developed. She is not ill-appearing or toxic-appearing.  Cardiovascular:     Rate and Rhythm: Normal rate and regular rhythm.     Pulses:  Normal pulses.     Heart sounds: Normal heart sounds, S1 normal and S2 normal.  Pulmonary:     Effort: Pulmonary effort is normal.     Breath sounds: Normal breath sounds.  Genitourinary:    Vagina: Signs of injury (tiny laceration - non-bleeding) present.  Skin:    General:  Skin is warm and dry.  Neurological:     Mental Status: She is alert.     GCS: GCS eye subscore is 4. GCS verbal subscore is 5. GCS motor subscore is 6.  Psychiatric:        Attention and Perception: Attention normal.        Mood and Affect: Affect is tearful.        Speech: Speech normal.        Behavior: Behavior normal. Behavior is cooperative.    Results for orders placed or performed in visit on 12/17/23  POCT urinalysis dipstick  Result Value Ref Range   Color, UA amber    Clarity, UA clear    Glucose, UA Negative Negative   Bilirubin, UA Negative    Ketones, UA Negative    Spec Grav, UA >=1.030 (A) 1.010 - 1.025   Blood, UA 1    pH, UA 5.0 5.0 - 8.0   Protein, UA Positive (A) Negative   Urobilinogen, UA 0.2 0.2 or 1.0 E.U./dL   Nitrite, UA Negative    Leukocytes, UA Negative Negative   Appearance     Odor      Assessment and Plan:   Dysuria No red flags Will empirically start keflex for urinary tract infection (UTI) symptom(s) Culture pending  Vaginal itching Concern for possible microabrasion  Vaginal swab obtained with permission to perform sexual transmitted infection testing as well Recommend avoiding penetrative sex until no further symptom(s) She has topical lidocaine to use as needed Low threshold to see gynecology if persists  Left axillary pain Planning for close follow-up with surgeon in 2 days  Type 2 diabetes mellitus without complication, without long-term current use of insulin (HCC) Well controlled Tolerating Ozempic well Continue 1 mg dosage weekly Follow-up in 3 months, sooner   Anxiety; Reactive depression Uncontrolled Will refer to psychiatry She has close  follow-up with therapist tomorrow   Jarold Motto, PA-C  I,Safa M Kadhim,acting as a scribe for Jarold Motto, PA.,have documented all relevant documentation on the behalf of Jarold Motto, PA,as directed by  Jarold Motto, PA while in the presence of Jarold Motto, [place].   I, Jarold Motto, [place], have reviewed all documentation for this visit. The documentation on 12/17/23 for the exam, diagnosis, procedures, and orders are all accurate and complete.

## 2023-12-17 NOTE — Patient Instructions (Signed)
It was great to see you!  We will treat for possible urinary tract infection (UTI) and skin infection until we get results. Please refrain for sexual activity until symptom(s) resolve  If ongoing issues, lets get you in with gynecology   I'm going to place referral for psychiatry   Follow-up in May, sooner if concerns

## 2023-12-18 ENCOUNTER — Encounter: Payer: Self-pay | Admitting: Physician Assistant

## 2023-12-18 ENCOUNTER — Other Ambulatory Visit: Payer: Self-pay | Admitting: Physician Assistant

## 2023-12-18 ENCOUNTER — Ambulatory Visit (INDEPENDENT_AMBULATORY_CARE_PROVIDER_SITE_OTHER): Payer: No Typology Code available for payment source | Admitting: Behavioral Health

## 2023-12-18 DIAGNOSIS — F321 Major depressive disorder, single episode, moderate: Secondary | ICD-10-CM

## 2023-12-18 DIAGNOSIS — F419 Anxiety disorder, unspecified: Secondary | ICD-10-CM | POA: Diagnosis not present

## 2023-12-18 LAB — URINE CULTURE
MICRO NUMBER:: 15981800
Result:: NO GROWTH
SPECIMEN QUALITY:: ADEQUATE

## 2023-12-18 LAB — CERVICOVAGINAL ANCILLARY ONLY
Bacterial Vaginitis (gardnerella): POSITIVE — AB
Candida Glabrata: NEGATIVE
Candida Vaginitis: POSITIVE — AB
Chlamydia: NEGATIVE
Comment: NEGATIVE
Comment: NEGATIVE
Comment: NEGATIVE
Comment: NEGATIVE
Comment: NEGATIVE
Comment: NORMAL
Neisseria Gonorrhea: NEGATIVE
Trichomonas: NEGATIVE

## 2023-12-18 MED ORDER — FLUCONAZOLE 150 MG PO TABS: ORAL_TABLET | ORAL | 0 refills | Status: DC

## 2023-12-18 MED ORDER — METRONIDAZOLE 500 MG PO TABS: 500.0000 mg | ORAL_TABLET | Freq: Two times a day (BID) | ORAL | 0 refills | Status: AC
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
## 2023-12-18 NOTE — Progress Notes (Signed)
                Anastasya [name] L Farryn Linares, LMFT 

## 2023-12-31 ENCOUNTER — Encounter: Payer: Self-pay | Admitting: Physician Assistant

## 2023-12-31 ENCOUNTER — Other Ambulatory Visit: Payer: Self-pay | Admitting: Physician Assistant

## 2024-01-01 ENCOUNTER — Encounter: Payer: Self-pay | Admitting: Physician Assistant

## 2024-01-01 ENCOUNTER — Ambulatory Visit: Payer: No Typology Code available for payment source | Admitting: Physician Assistant

## 2024-01-01 VITALS — BP 110/76 | HR 115 | Temp 97.9°F | Ht 62.75 in | Wt 137.2 lb

## 2024-01-01 DIAGNOSIS — R6889 Other general symptoms and signs: Secondary | ICD-10-CM

## 2024-01-01 LAB — POC INFLUENZA A&B (BINAX/QUICKVUE)
Influenza A, POC: NEGATIVE
Influenza B, POC: NEGATIVE

## 2024-01-01 NOTE — Progress Notes (Signed)
 Carrie Richards is a 53 y.o. female here for a new problem.  History of Present Illness:   Chief Complaint  Patient presents with   Cough    Pt c/o cough and chest congestion, runny nose started yesterday, COVID tests Neg both days in a row.    Cough  she complains of a cough that has persisted for the past several days Associated symptoms include chest congestion, headaches, ear pain, body aches, and irritated throat.  she denies any loss of taste at this time.  Heart rate is higher today, not sure is she's dehydrated.  Does report exposure to Covid as family members currently have it.  At home Covid test done yesterday and earlier today is negative. She was unable to take paxlovid with prior infections per oncologist recommendation.  Has been taking ibuprofen for headaches.  Flu test in office today is negative.  Past Medical History:  Diagnosis Date   Anemia    Anxiety    Breast cancer (HCC)    tripple neg, chemo, radiation, b/l mastectomy follows in [facility]   Breast disorder    Depression    H/O echocardiogram    TTE 10/14/2023: EF 60-65, no RWMA, GR 1 DD, GLS -22.4, normal RVSF, normal PASP, RVSP 15.4, RAP 3   Hx of adenomatous polyp of colon 10/25/2023   Lymphedema    post chemo   Reactive depression 06/16/2017   Shortness of breath on exertion      Social History   Tobacco Use   Smoking status: Never   Smokeless tobacco: Never  Vaping Use   Vaping status: Never Used  Substance Use Topics   Alcohol use: Yes    Alcohol/week: 2.0 standard drinks of alcohol    Types: 2 Standard drinks or equivalent per week    Comment: rarely   Drug use: No    Past Surgical History:  Procedure Laterality Date   ABDOMINAL HYSTERECTOMY     [facility] desires annual pap smears   BREAST RECONSTRUCTION Bilateral    BROW LIFT     CESAREAN SECTION     CHOLECYSTECTOMY     COLONOSCOPY  09/2023   MASTECTOMY Bilateral 02/07/2022   WRIST SURGERY Right     Family History   Problem Relation Age of Onset   Diabetes Mother    Cancer Mother    Breast cancer Mother    Ovarian cancer Mother    Hypertension Mother    Hyperlipidemia Mother    Depression Mother    Sleep apnea Mother    Melanoma Father    Hypertension Father    Hyperlipidemia Father    Diabetes Father    Cancer Father    Sleep apnea Father    Hypertension Sister    Depression Sister    Hypertension Sister    Heart disease Sister    Depression Sister    Heart attack Maternal Grandmother    Breast cancer Paternal Grandmother    Colon cancer Neg Hx    Colon polyps Neg Hx    Esophageal cancer Neg Hx    Rectal cancer Neg Hx    Stomach cancer Neg Hx     No Known Allergies  Current Medications:   Current Outpatient Medications:    albuterol  (VENTOLIN  HFA) 108 (90 Base) MCG/ACT inhaler, Inhale into the lungs., Disp: , Rfl:    atorvastatin  (LIPITOR) 20 MG tablet, Take 1 tablet (20 mg total) by mouth daily., Disp: 90 tablet, Rfl: 3   cephALEXin  (KEFLEX )  500 MG capsule, Take 1 capsule (500 mg total) by mouth 2 (two) times daily., Disp: 14 capsule, Rfl: 0   cyclobenzaprine  (FLEXERIL ) 10 MG tablet, Take 10 mg by mouth daily., Disp: , Rfl:    docusate sodium (COLACE) 100 MG capsule, Take 100 mg by mouth as needed for mild constipation or moderate constipation., Disp: , Rfl:    gabapentin  (NEURONTIN ) 300 MG capsule, Take 300 mg by mouth 3 (three) times daily., Disp: , Rfl:    Ibuprofen-Acetaminophen  (DUAL ACTION PAIN RELIEF PO), Take by mouth., Disp: , Rfl:    LORazepam  (ATIVAN ) 1 MG tablet, Take 0.5-1 tablets (0.5-1 mg total) by mouth at bedtime as needed. for sleep, Disp: 30 tablet, Rfl: 1   metoprolol  succinate (TOPROL  XL) 25 MG 24 hr tablet, Take 1 tablet (25 mg total) by mouth in the morning and at bedtime., Disp: 180 tablet, Rfl: 3   nitrofurantoin , macrocrystal-monohydrate, (MACROBID ) 100 MG capsule, Take 1 capsule (100 mg total) by mouth at bedtime., Disp: 30 capsule, Rfl: 2   QUEtiapine   (SEROQUEL ) 25 MG tablet, TAKE 1 TABLET BY MOUTH EVERYDAY AT BEDTIME, Disp: 90 tablet, Rfl: 1   Semaglutide , 1 MG/DOSE, (OZEMPIC , 1 MG/DOSE,) 4 MG/3ML SOPN, INJECT 1 MG ONCE A WEEK AS DIRECTED, Disp: 3 mL, Rfl: 2   sertraline  (ZOLOFT ) 50 MG tablet, Take 1 tablet (50 mg total) by mouth daily., Disp: 90 tablet, Rfl: 1   zolpidem  (AMBIEN ) 10 MG tablet, Take 1 tablet (10 mg total) by mouth at bedtime as needed for sleep., Disp: 30 tablet, Rfl: 1   Review of Systems:   Review of Systems  HENT:  Positive for congestion, ear pain and sore throat.   Respiratory:  Positive for cough.   Musculoskeletal:        +body aches  Neurological:  Positive for headaches.    Vitals:   Vitals:   01/01/24 1037  BP: 110/76  Pulse: (!) 115  Temp: 97.9 F (36.6 C)  TempSrc: Temporal  SpO2: 97%  Weight: 137 lb 4 oz (62.3 kg)  Height: 5' 2.75 (1.594 m)     Body mass index is 24.51 kg/m.  Physical Exam:   Physical Exam Vitals and nursing note reviewed.  Constitutional:      General: She is not in acute distress.    Appearance: She is well-developed. She is not ill-appearing or toxic-appearing.  Cardiovascular:     Rate and Rhythm: Regular rhythm. Tachycardia present.     Pulses: Normal pulses.     Heart sounds: Normal heart sounds, S1 normal and S2 normal.  Pulmonary:     Effort: Pulmonary effort is normal.     Breath sounds: Normal breath sounds.  Skin:    General: Skin is warm and dry.  Neurological:     Mental Status: She is alert.     GCS: GCS eye subscore is 4. GCS verbal subscore is 5. GCS motor subscore is 6.  Psychiatric:        Speech: Speech normal.        Behavior: Behavior normal. Behavior is cooperative.    Results for orders placed or performed in visit on 01/01/24  POC Influenza A&B(BINAX/QUICKVUE)  Result Value Ref Range   Influenza A, POC Negative Negative   Influenza B, POC Negative Negative     Assessment and Plan:   Flu-like symptoms No red flags on exam.    Flu is negative Suspect she has COVID and is not testing positive yet Discussed risks vs benefits  of Paxlovid should she test positive or if she would like to go ahead and start - she declines at this time  Recommend recheck COVID test in a few days  Reviewed return precautions including new or worsening fever, SOB, new or worsening cough or other concerns.  Push fluids and rest.  I recommend that patient follow-up if symptoms worsen or persist despite treatment x 7-10 days, sooner if needed.  Lucie Buttner, PA-C  I,Safa M Kadhim,acting as a scribe for Lucie Buttner, PA.,have documented all relevant documentation on the behalf of Lucie Buttner, PA,as directed by  Lucie Buttner, PA while in the presence of Lucie Buttner, [place].   I, Lucie Buttner, [place], have reviewed all documentation for this visit. The documentation on 01/01/24 for the exam, diagnosis, procedures, and orders are all accurate and complete.

## 2024-01-03 ENCOUNTER — Ambulatory Visit: Payer: No Typology Code available for payment source | Admitting: Behavioral Health

## 2024-01-22 ENCOUNTER — Encounter: Payer: Self-pay | Admitting: Physician Assistant

## 2024-01-22 MED ORDER — OZEMPIC (2 MG/DOSE) 8 MG/3ML ~~LOC~~ SOPN: 2.0000 mg | PEN_INJECTOR | SUBCUTANEOUS | 2 refills | Status: DC

## 2024-01-26 ENCOUNTER — Other Ambulatory Visit: Payer: Self-pay | Admitting: Physician Assistant

## 2024-01-27 NOTE — Telephone Encounter (Signed)
 Pt requesting refill for Zolpidem 10 mg. Last OV 01/01/2024.

## 2024-02-05 ENCOUNTER — Encounter: Payer: Self-pay | Admitting: Physician Assistant

## 2024-02-05 ENCOUNTER — Ambulatory Visit: Payer: No Typology Code available for payment source | Admitting: Physician Assistant

## 2024-02-05 VITALS — BP 112/80 | HR 76 | Temp 97.7°F | Ht 62.75 in | Wt 134.4 lb

## 2024-02-05 DIAGNOSIS — E119 Type 2 diabetes mellitus without complications: Secondary | ICD-10-CM | POA: Diagnosis not present

## 2024-02-05 DIAGNOSIS — F329 Major depressive disorder, single episode, unspecified: Secondary | ICD-10-CM | POA: Diagnosis not present

## 2024-02-05 DIAGNOSIS — R296 Repeated falls: Secondary | ICD-10-CM

## 2024-02-05 DIAGNOSIS — F5102 Adjustment insomnia: Secondary | ICD-10-CM

## 2024-02-05 DIAGNOSIS — Z7985 Long-term (current) use of injectable non-insulin antidiabetic drugs: Secondary | ICD-10-CM

## 2024-02-05 LAB — COMPREHENSIVE METABOLIC PANEL
ALT: 14 U/L (ref 0–35)
AST: 15 U/L (ref 0–37)
Albumin: 5 g/dL (ref 3.5–5.2)
Alkaline Phosphatase: 80 U/L (ref 39–117)
BUN: 14 mg/dL (ref 6–23)
CO2: 32 meq/L (ref 19–32)
Calcium: 10.8 mg/dL — ABNORMAL HIGH (ref 8.4–10.5)
Chloride: 103 meq/L (ref 96–112)
Creatinine, Ser: 0.84 mg/dL (ref 0.40–1.20)
GFR: 79.84 mL/min (ref >60.00)
Glucose, Bld: 105 mg/dL — ABNORMAL HIGH (ref 70–99)
Potassium: 5.1 meq/L (ref 3.5–5.1)
Sodium: 142 meq/L (ref 135–145)
Total Bilirubin: 0.8 mg/dL (ref 0.2–1.2)
Total Protein: 7 g/dL (ref 6.0–8.3)

## 2024-02-05 LAB — MICROALBUMIN / CREATININE URINE RATIO
Creatinine,U: 197 mg/dL
Microalb Creat Ratio: 7.2 mg/g (ref 0.0–30.0)
Microalb, Ur: 1.4 mg/dL (ref 0.0–1.9)

## 2024-02-05 LAB — HEMOGLOBIN A1C: Hgb A1c MFr Bld: 5.4 % (ref 4.6–6.5)

## 2024-02-05 MED ORDER — SERTRALINE HCL 100 MG PO TABS: 100.0000 mg | ORAL_TABLET | Freq: Every day | ORAL | 3 refills | Status: DC

## 2024-02-05 MED ORDER — QUETIAPINE FUMARATE 25 MG PO TABS
25.0000 mg | ORAL_TABLET | Freq: Every day | ORAL | 1 refills | Status: DC
Start: 2024-02-05 — End: 2024-07-10

## 2024-02-05 MED ORDER — ZOLPIDEM TARTRATE 10 MG PO TABS
10.0000 mg | ORAL_TABLET | Freq: Every evening | ORAL | 2 refills | Status: DC | PRN
Start: 2024-02-05 — End: 2024-06-26

## 2024-02-05 NOTE — Progress Notes (Signed)
 Carrie Richards is a 53 y.o. female here for a follow up of a pre-existing problem.  History of Present Illness:   Chief Complaint  Patient presents with   Diabetes    Diabetes  Reports compliance and good tolerance of Ozempic 2 mg weekly.  Denies any new or further side effects with new dose increase.    Her goal weight is 130 pounds.  Will recheck levels today.   Reactive Depression; Insomnia Reports compliance and good tolerance of Ambien 10 mg and Seroquel 25 mg nightly. Patient state she's unable to sleep without medications.   She would occasionally take a half dose of Seroquel 25 mg to try out effects as she's interested in decreasing medications.    Recurrent falls She has had some unexplained falls recently Once was down some stairs, another was in the dining room, she is unable to recall certain things She feels like this is all related to her mental health When her stress is very high, she has increase in these episodes She is established with a neurologist in [place] and has appointment in a few weeks    Past Medical History:  Diagnosis Date   Anemia    Anxiety    Breast cancer (HCC)    tripple neg, chemo, radiation, b/l mastectomy follows in [facility]   Breast disorder    Depression    H/O echocardiogram    TTE 10/14/2023: EF 60-65, no RWMA, GR 1 DD, GLS -22.4, normal RVSF, normal PASP, RVSP 15.4, RAP 3   Hx of adenomatous polyp of colon 10/25/2023   Lymphedema    post chemo   Reactive depression 06/16/2017   Shortness of breath on exertion      Social History   Tobacco Use   Smoking status: Never   Smokeless tobacco: Never  Vaping Use   Vaping status: Never Used  Substance Use Topics   Alcohol use: Yes    Alcohol/week: 2.0 standard drinks of alcohol    Types: 2 Standard drinks or equivalent per week    Comment: rarely   Drug use: No    Past Surgical History:  Procedure Laterality Date   ABDOMINAL HYSTERECTOMY     [facility] desires  annual pap smears   BREAST RECONSTRUCTION Bilateral    BROW LIFT     CESAREAN SECTION     CHOLECYSTECTOMY     COLONOSCOPY  09/2023   MASTECTOMY Bilateral 02/07/2022   WRIST SURGERY Right     Family History  Problem Relation Age of Onset   Diabetes Mother    Breast cancer Mother    Ovarian cancer Mother    Hypertension Mother    Hyperlipidemia Mother    Depression Mother    Sleep apnea Mother    Melanoma Father    Hypertension Father    Hyperlipidemia Father    Diabetes Father    Cancer Father    Sleep apnea Father    Hypertension Sister    Depression Sister    Hypertension Sister    Heart disease Sister    Depression Sister    Heart attack Maternal Grandmother    Breast cancer Paternal Grandmother    Colon cancer Neg Hx    Colon polyps Neg Hx    Esophageal cancer Neg Hx    Rectal cancer Neg Hx    Stomach cancer Neg Hx     No Known Allergies  Current Medications:   Current Outpatient Medications:    albuterol (VENTOLIN HFA) 108 (90  Base) MCG/ACT inhaler, Inhale into the lungs., Disp: , Rfl:    atorvastatin (LIPITOR) 20 MG tablet, Take 1 tablet (20 mg total) by mouth daily., Disp: 90 tablet, Rfl: 3   cyclobenzaprine (FLEXERIL) 10 MG tablet, Take 10 mg by mouth daily., Disp: , Rfl:    docusate sodium (COLACE) 100 MG capsule, Take 100 mg by mouth as needed for mild constipation or moderate constipation., Disp: , Rfl:    gabapentin (NEURONTIN) 300 MG capsule, Take 300 mg by mouth 3 (three) times daily., Disp: , Rfl:    Ibuprofen-Acetaminophen (DUAL ACTION PAIN RELIEF PO), Take by mouth., Disp: , Rfl:    LORazepam (ATIVAN) 1 MG tablet, Take 0.5-1 tablets (0.5-1 mg total) by mouth at bedtime as needed. for sleep, Disp: 30 tablet, Rfl: 1   metoprolol succinate (TOPROL XL) 25 MG 24 hr tablet, Take 1 tablet (25 mg total) by mouth in the morning and at bedtime., Disp: 180 tablet, Rfl: 3   nitrofurantoin, macrocrystal-monohydrate, (MACROBID) 100 MG capsule, Take 1 capsule (100  mg total) by mouth at bedtime., Disp: 30 capsule, Rfl: 2   Semaglutide, 2 MG/DOSE, (OZEMPIC, 2 MG/DOSE,) 8 MG/3ML SOPN, Inject 2 mg into the skin once a week., Disp: 3 mL, Rfl: 2   QUEtiapine (SEROQUEL) 25 MG tablet, Take 1 tablet (25 mg total) by mouth at bedtime., Disp: 90 tablet, Rfl: 1   sertraline (ZOLOFT) 100 MG tablet, Take 1 tablet (100 mg total) by mouth daily., Disp: 90 tablet, Rfl: 3   zolpidem (AMBIEN) 10 MG tablet, Take 1 tablet (10 mg total) by mouth at bedtime as needed. for sleep, Disp: 30 tablet, Rfl: 2   Review of Systems:   Review of Systems  Psychiatric/Behavioral:  The patient has insomnia.   Negative unless otherwise specified per HPI.   Vitals:   Vitals:   02/05/24 0835  BP: 112/80  Pulse: 76  Temp: 97.7 F (36.5 C)  TempSrc: Temporal  SpO2: 98%  Weight: 134 lb 6.1 oz (61 kg)  Height: 5' 2.75" (1.594 m)     Body mass index is 23.99 kg/m.  Physical Exam:   Physical Exam Vitals and nursing note reviewed.  Constitutional:      General: She is not in acute distress.    Appearance: She is well-developed. She is not ill-appearing or toxic-appearing.  Cardiovascular:     Rate and Rhythm: Normal rate and regular rhythm.     Pulses: Normal pulses.     Heart sounds: Normal heart sounds, S1 normal and S2 normal.  Pulmonary:     Effort: Pulmonary effort is normal.     Breath sounds: Normal breath sounds.  Skin:    General: Skin is warm and dry.  Neurological:     Mental Status: She is alert.     GCS: GCS eye subscore is 4. GCS verbal subscore is 5. GCS motor subscore is 6.  Psychiatric:        Speech: Speech normal.        Behavior: Behavior normal. Behavior is cooperative.     Assessment and Plan:   Type 2 diabetes mellitus without complication, without long-term current use of insulin (HCC) Update blood work today Continue Ozempic 2 mg weekly Will also update urine microalbumin creatinine ratio Continue 20 mg Lipitor daily Follow-up in 3 to 6  months, sooner if concerns  Reactive depression Uncontrolled Increase Zoloft to 100 mg daily Consider TMS, I have sent her information about this Continue with talk therapy I discussed with  patient that if they develop any SI, to tell someone immediately and seek medical attention. Follow-up in 3 to 6 months, sooner if concerns  Adjustment insomnia Ongoing Continue Ambien 10 mg daily and Seroquel 25 mg nightly Follow-up in 3 months, sooner if concerns  Recurrent falls She is planning to discuss further with her neurologist that she is seeing in a few weeks Recommend discussion with her regarding all of her symptoms for appropriate workup Continue efforts at better stress management  Jarold Motto, PA-C  Ladona Mow M Kadhim,acting as a scribe for Jarold Motto, PA.,have documented all relevant documentation on the behalf of Jarold Motto, PA,as directed by  Jarold Motto, PA while in the presence of Jarold Motto, [place].   I, Jarold Motto, [place], have reviewed all documentation for this visit. The documentation on 02/05/24 for the exam, diagnosis, procedures, and orders are all accurate and complete.

## 2024-02-05 NOTE — Patient Instructions (Signed)
 It was great to see you!  Please follow up with neurology regarding your memory  We will increase your Zoloft to 100 mg today  Follow-up in 3 months, sooner if concerns  Take care,  Jarold Motto PA-C

## 2024-02-06 ENCOUNTER — Encounter: Payer: Self-pay | Admitting: Physician Assistant

## 2024-02-14 ENCOUNTER — Other Ambulatory Visit: Payer: Self-pay | Admitting: Physician Assistant

## 2024-02-14 MED ORDER — CYCLOBENZAPRINE HCL 10 MG PO TABS: 10.0000 mg | ORAL_TABLET | Freq: Every day | ORAL | 1 refills | Status: DC

## 2024-02-14 NOTE — Telephone Encounter (Signed)
Last refill done by historical provider  

## 2024-04-03 ENCOUNTER — Other Ambulatory Visit: Payer: Self-pay | Admitting: Physician Assistant

## 2024-04-07 ENCOUNTER — Telehealth: Payer: Self-pay | Admitting: Physician Assistant

## 2024-04-07 ENCOUNTER — Ambulatory Visit: Payer: Self-pay

## 2024-04-07 ENCOUNTER — Ambulatory Visit: Admitting: Physician Assistant

## 2024-04-07 VITALS — BP 90/60 | HR 92 | Temp 99.1°F | Wt 127.0 lb

## 2024-04-07 DIAGNOSIS — L299 Pruritus, unspecified: Secondary | ICD-10-CM

## 2024-04-07 DIAGNOSIS — L509 Urticaria, unspecified: Secondary | ICD-10-CM | POA: Diagnosis not present

## 2024-04-07 MED ORDER — METHYLPREDNISOLONE ACETATE 40 MG/ML IJ SUSP
40.0000 mg | Freq: Once | INTRAMUSCULAR | Status: AC
  Administered 2024-04-07: 40 mg via INTRAMUSCULAR

## 2024-04-07 NOTE — Telephone Encounter (Signed)
 Noted, appt today.

## 2024-04-07 NOTE — Patient Instructions (Signed)
  VISIT SUMMARY: Today, you were seen for itching and hives that started yesterday and have spread to various parts of your body.  YOUR PLAN: URTICARIA (HIVES): You have acute hives and itching affecting your ears, knees, and the back of your legs. There are no recent medication changes or known allergen exposures, but it might be related to your recent travel or new detergents/fabrics. -You received a steroid injection to help with inflammation and itching. -You can take oral antihistamines like cetirizine, loratadine, or fexofenadine. -Avoid using topical Benadryl; oral Benadryl is okay to use. -Stay hydrated and keep cool to prevent worsening of symptoms. -Monitor for any new symptoms and seek emergency care if you experience chest pain, difficulty breathing, lip swelling, or respiratory distress. -Cool baths can help relieve itching; avoid overheating.                       Contains text generated by [employer]bridge.                                 Contains text generated by Abridge.

## 2024-04-07 NOTE — Telephone Encounter (Signed)
  Chief Complaint: Rash Symptoms: hive like rash Frequency: constant X 1 day Pertinent Negatives: Patient denies difficulty swallowing, difficulty breathing, oral swelling Disposition: [] ED /[] Urgent Care (no appt availability in office) / [x] Appointment(In office/virtual)/ []  [facility]/ [] Home Care/ [] Refused Recommended Disposition /[] [facility]/ []  Follow-up with PCP Additional Notes:  Received transfer from [facility] to triage rash. 'Hives'. Itching rash that is red raised splotches, started on 04/06/24 and worsening. Started on ears and progressed to widespread. Rash currently present on face, ears, legs, arms, back, buttocks. No new products, foods, or medications. Used oral Benadryl and cream without any relief or improvement. Denies all other symptoms.  Recently recovered from upper respiratory cold.  Triage complete, appropriate for acute evaluation today in office.   Reason for Disposition  [1] MODERATE-SEVERE hives persist (i.e., hives interfere with normal activities or work) AND [2] taking antihistamine (e.g., Benadryl, Claritin) > 24 hours  Protocols used: Hives-A-AH

## 2024-04-07 NOTE — Progress Notes (Signed)
 Patient ID: TUJUANA EWINGS, female    DOB: 1971/08/19, 53 y.o.   MRN: [id number]   Assessment & Plan:  Hives  Itching -     methylPREDNISolone  Acetate      Assessment & Plan Urticaria Acute urticaria with pruritus and hives affecting ears, arms, knees, and posterior legs. No recent medication changes or known allergen exposure. Differential includes unknown allergen, possibly from recent travel or new detergents/fabrics. No evidence of bed bugs, scabies, or shingles. No respiratory or gastrointestinal symptoms. Allergy to jalapenos. - Administer steroid injection for inflammation and pruritus. - Recommend oral antihistamines: cetirizine, loratadine, or fexofenadine. - Advise against topical diphenhydramine; oral diphenhydramine is acceptable. - Encourage hydration and cool environment to prevent symptom exacerbation. - Instruct to monitor for new symptoms and seek emergency care for chest pain, dyspnea, lip swelling, or respiratory distress. - Suggest cool baths for relief, avoid overheating.      No follow-ups on file.    Subjective:    Chief Complaint  Patient presents with   Rash    Pt seen in office today for new rash started on the ears and now is all over the body. Started yesterday, itching up into scalp and all over benadryl cream and pills with no relief. No changes in diet, shampoo, soap, food, no pets, no gardening.     Rash   Discussed the use of AI scribe software for clinical note transcription with the patient, who gave verbal consent to proceed.  History of Present Illness SHAWNELL GLEW is a 53 year old female in remission from breast cancer who presents with itching and hives. She is accompanied by her husband.  She began experiencing itching yesterday, initially affecting both ears, and it has since spread to other areas, including her knees and behind her legs. The itching is intermittent, with periods of relief followed by recurrence. She describes  the sensation as sometimes resulting in 'patching up again.'  She denies any previous episodes of similar breakouts and has not been sick recently, although she did experience cold symptoms, including cough and sore throat, about a week ago. She has not started any new medications in the past month or two and has not stopped any existing medications. She has been using Benadryl and Benadryl cream to manage the itching.  She mentions a recent overnight stay in [place] but denies exposure to pools, hot tubs, or any new soaps or lotions. She confirms that no one else who stayed with her has similar symptoms. No shortness of breath, chest pain, nausea, vomiting, diarrhea, or any signs of bed bugs. She also denies exposure to poison ivy or oak.  She is in remission from breast cancer and has not experienced any new symptoms related to that condition. She is intentionally losing weight.     Past Medical History:  Diagnosis Date   Anemia    Anxiety    Breast cancer (HCC)    tripple neg, chemo, radiation, b/l mastectomy follows in [facility]   Breast disorder    Depression    H/O echocardiogram    TTE 10/14/2023: EF 60-65, no RWMA, GR 1 DD, GLS -22.4, normal RVSF, normal PASP, RVSP 15.4, RAP 3   Hx of adenomatous polyp of colon 10/25/2023   Lymphedema    post chemo   Reactive depression 06/16/2017   Shortness of breath on exertion     Past Surgical History:  Procedure Laterality Date   ABDOMINAL HYSTERECTOMY     [facility]  desires annual pap smears   BREAST RECONSTRUCTION Bilateral    BROW LIFT     CESAREAN SECTION     CHOLECYSTECTOMY     COLONOSCOPY  09/2023   MASTECTOMY Bilateral 02/07/2022   WRIST SURGERY Right     Family History  Problem Relation Age of Onset   Diabetes Mother    Breast cancer Mother    Ovarian cancer Mother    Hypertension Mother    Hyperlipidemia Mother    Depression Mother    Sleep apnea Mother    Melanoma Father    Hypertension Father     Hyperlipidemia Father    Diabetes Father    Cancer Father    Sleep apnea Father    Hypertension Sister    Depression Sister    Hypertension Sister    Heart disease Sister    Depression Sister    Heart attack Maternal Grandmother    Breast cancer Paternal Grandmother    Colon cancer Neg Hx    Colon polyps Neg Hx    Esophageal cancer Neg Hx    Rectal cancer Neg Hx    Stomach cancer Neg Hx     Social History   Tobacco Use   Smoking status: Never   Smokeless tobacco: Never  Vaping Use   Vaping status: Never Used  Substance Use Topics   Alcohol use: Yes    Alcohol/week: 2.0 standard drinks of alcohol    Types: 2 Standard drinks or equivalent per week    Comment: rarely   Drug use: No     No Known Allergies  Review of Systems  Skin:  Positive for rash.   NEGATIVE UNLESS OTHERWISE INDICATED IN HPI      Objective:     BP 90/60 (BP Location: Left Arm, Patient Position: Sitting, Cuff Size: Normal)   Pulse 92   Temp 99.1 F (37.3 C) (Temporal)   Wt 127 lb (57.6 kg)   LMP  (LMP Unknown)   SpO2 98%   BMI 22.68 kg/m   Wt Readings from Last 3 Encounters:  04/07/24 127 lb (57.6 kg)  02/05/24 134 lb 6.1 oz (61 kg)  01/01/24 137 lb 4 oz (62.3 kg)    BP Readings from Last 3 Encounters:  04/07/24 90/60  02/05/24 112/80  01/01/24 110/76     Physical Exam Vitals and nursing note reviewed.  Constitutional:      General: She is not in acute distress.    Appearance: Normal appearance. She is not ill-appearing.  HENT:     Head: Normocephalic.     Right Ear: Tympanic membrane, ear canal and external ear normal.     Left Ear: Tympanic membrane, ear canal and external ear normal.     Nose: No congestion.     Mouth/Throat:     Mouth: Mucous membranes are moist.     Pharynx: No oropharyngeal exudate or posterior oropharyngeal erythema.  Eyes:     Extraocular Movements: Extraocular movements intact.     Conjunctiva/sclera: Conjunctivae normal.     Pupils: Pupils are  equal, round, and reactive to light.  Cardiovascular:     Rate and Rhythm: Normal rate and regular rhythm.     Pulses: Normal pulses.     Heart sounds: Normal heart sounds. No murmur heard. Pulmonary:     Effort: Pulmonary effort is normal. No respiratory distress.     Breath sounds: Normal breath sounds. No wheezing.  Musculoskeletal:     Cervical back: Normal range of motion.  Skin:    General: Skin is warm.     Findings: Rash (hives present ears, face, neck, chest, forearms, upper thighs) present.  Neurological:     Mental Status: She is alert and oriented to person, place, and time.  Psychiatric:        Mood and Affect: Mood normal.        Behavior: Behavior normal.             Adean Milosevic M Vikas Wegmann, PA-C

## 2024-04-07 NOTE — Telephone Encounter (Signed)
 FYI: This call has been transferred to triage nurse: the Triage Nurse. Once the result note has been entered staff can address the message at that time.  Patient called in with the following symptoms:  Red Word:Rash/Hives all over body including face and ears (started on ears)-started 04/06/24 pm   Please advise at Mobile [phone] (mobile)  Message is routed to Provider Pool.

## 2024-04-17 ENCOUNTER — Other Ambulatory Visit: Payer: Self-pay | Admitting: Physician Assistant

## 2024-05-08 ENCOUNTER — Encounter: Payer: Self-pay | Admitting: Physician Assistant

## 2024-05-11 MED ORDER — ACCU-CHEK GUIDE TEST VI STRP: ORAL_STRIP | 4 refills | Status: AC

## 2024-05-11 MED ORDER — ACCU-CHEK FASTCLIX LANCETS MISC
4 refills | Status: AC
Start: 2024-05-11 — End: ?

## 2024-05-11 NOTE — Addendum Note (Signed)
 Addended by: Winona Haw on: 05/11/2024 08:33 AM   Modules accepted: Orders

## 2024-05-15 ENCOUNTER — Other Ambulatory Visit: Payer: Self-pay

## 2024-05-15 ENCOUNTER — Emergency Department (HOSPITAL_BASED_OUTPATIENT_CLINIC_OR_DEPARTMENT_OTHER)
Admission: EM | Admit: 2024-05-15 | Discharge: 2024-05-15 | Disposition: A | Discharge status: Home / Self Care | Attending: Emergency Medicine | Admitting: Emergency Medicine

## 2024-05-15 ENCOUNTER — Emergency Department (HOSPITAL_BASED_OUTPATIENT_CLINIC_OR_DEPARTMENT_OTHER)

## 2024-05-15 DIAGNOSIS — R1110 Vomiting, unspecified: Secondary | ICD-10-CM | POA: Insufficient documentation

## 2024-05-15 DIAGNOSIS — R519 Headache, unspecified: Secondary | ICD-10-CM

## 2024-05-15 DIAGNOSIS — Z79899 Other long term (current) drug therapy: Secondary | ICD-10-CM | POA: Diagnosis not present

## 2024-05-15 DIAGNOSIS — D649 Anemia, unspecified: Secondary | ICD-10-CM | POA: Diagnosis not present

## 2024-05-15 LAB — CBC
HCT: 36.1 % (ref 36.0–46.0)
Hemoglobin: 11.9 g/dL — ABNORMAL LOW (ref 12.0–15.0)
MCH: 32.7 pg (ref 26.0–34.0)
MCHC: 33 g/dL (ref 30.0–36.0)
MCV: 99.2 fL (ref 80.0–100.0)
Platelets: 274 10*3/uL (ref 150–400)
RBC: 3.64 MIL/uL — ABNORMAL LOW (ref 3.87–5.11)
RDW: 12.1 % (ref 11.5–15.5)
WBC: 5.9 10*3/uL (ref 4.0–10.5)
nRBC: 0 % (ref 0.0–0.2)

## 2024-05-15 LAB — URINALYSIS, ROUTINE W REFLEX MICROSCOPIC
Bilirubin Urine: NEGATIVE
Glucose, UA: NEGATIVE mg/dL
Hgb urine dipstick: NEGATIVE
Ketones, ur: NEGATIVE mg/dL
Nitrite: NEGATIVE
Protein, ur: NEGATIVE mg/dL
Specific Gravity, Urine: 1.019 (ref 1.005–1.030)
pH: 6 (ref 5.0–8.0)

## 2024-05-15 LAB — COMPREHENSIVE METABOLIC PANEL WITH GFR
ALT: 18 U/L (ref 0–44)
AST: 21 U/L (ref 15–41)
Albumin: 4.5 g/dL (ref 3.5–5.0)
Alkaline Phosphatase: 108 U/L (ref 38–126)
Anion gap: 10 (ref 5–15)
BUN: 14 mg/dL (ref 6–20)
CO2: 28 mmol/L (ref 22–32)
Calcium: 10.2 mg/dL (ref 8.9–10.3)
Chloride: 101 mmol/L (ref 98–111)
Creatinine, Ser: 0.82 mg/dL (ref 0.44–1.00)
GFR, Estimated: >60 mL/min (ref >60)
Glucose, Bld: 81 mg/dL (ref 70–99)
Potassium: 4.1 mmol/L (ref 3.5–5.1)
Sodium: 139 mmol/L (ref 135–145)
Total Bilirubin: 0.3 mg/dL (ref 0.0–1.2)
Total Protein: 6.5 g/dL (ref 6.5–8.1)

## 2024-05-15 LAB — LIPASE, BLOOD: Lipase: 46 U/L (ref 11–51)

## 2024-05-15 MED ORDER — ONDANSETRON HCL 4 MG/2ML IJ SOLN
4.0000 mg | Freq: Once | INTRAMUSCULAR | Status: AC
  Administered 2024-05-15: 4 mg via INTRAVENOUS
  Filled 2024-05-15: qty 2

## 2024-05-15 MED ORDER — PROCHLORPERAZINE EDISYLATE 10 MG/2ML IJ SOLN
10.0000 mg | Freq: Once | INTRAMUSCULAR | Status: AC
  Administered 2024-05-15: 10 mg via INTRAVENOUS
  Filled 2024-05-15: qty 2

## 2024-05-15 MED ORDER — KETOROLAC TROMETHAMINE 15 MG/ML IJ SOLN
15.0000 mg | Freq: Once | INTRAMUSCULAR | Status: AC
  Administered 2024-05-15: 15 mg via INTRAVENOUS
  Filled 2024-05-15: qty 1

## 2024-05-15 MED ORDER — LACTATED RINGERS IV BOLUS
1000.0000 mL | Freq: Once | INTRAVENOUS | Status: AC
  Administered 2024-05-15: 1000 mL via INTRAVENOUS

## 2024-05-15 MED ORDER — DIPHENHYDRAMINE HCL 50 MG/ML IJ SOLN
12.5000 mg | Freq: Once | INTRAMUSCULAR | Status: AC
  Administered 2024-05-15: 12.5 mg via INTRAVENOUS
  Filled 2024-05-15: qty 1

## 2024-05-15 MED ORDER — DEXAMETHASONE SODIUM PHOSPHATE 10 MG/ML IJ SOLN
10.0000 mg | Freq: Once | INTRAMUSCULAR | Status: AC
  Administered 2024-05-15: 10 mg via INTRAVENOUS
  Filled 2024-05-15: qty 1

## 2024-05-15 NOTE — ED Triage Notes (Signed)
 Pt POV reporting increased fatigue, n/v/d, and seeing green spots past few days. Recent breast reconstruction surgery 6/6. Son recently had similar sx n/v/d last week, possible stomach bug.

## 2024-05-15 NOTE — ED Provider Notes (Signed)
 [place] EMERGENCY DEPARTMENT AT [facility] Provider Note   CSN: [id number] Arrival date & time: 05/15/24  1732     Patient presents with: No chief complaint on file.   KINZLEIGH KANDLER is a 53 y.o. female.   HPI Patient is a 53 year old female presenting to the ED today with complaints of headache and vomiting starting today.  Noted that she had been previously feeling ill for the week prior with GI bug like symptoms with diarrhea, fever, headache.  Today says that she started to have recurrence of her headache and vomiting.  Previous medical history of breast cancer status post mastectomy and breast reconstruction with breast reconstruction being done 05/01/2024.  Notes that she is having photophobia as well as sound sensitivities accompanied with tinnitus bilaterally.  Last vomiting episode was approximately 2 PM today.  Notes that she has not been eating and drinking as much as she should.  Reports surgical scars are healing well and is not having abdominal pain.  Denies chest pain, blurry vision, shortness of breath, abdominal pain, diarrhea, dysuria, vaginal bleeding, vaginal discharge, lower leg swelling.    Prior to Admission medications   Medication Sig Start Date End Date Taking? Authorizing Provider  Accu-Chek FastClix Lancets MISC USE TO CHECK BLOOD SUGAR DAILY AND AS NEEDED 05/11/24   Alexander Iba, PA  albuterol  (VENTOLIN  HFA) 108 (90 Base) MCG/ACT inhaler Inhale into the lungs. 10/26/22   [provider]  atorvastatin  (LIPITOR) 20 MG tablet Take 1 tablet (20 mg total) by mouth daily. 09/17/23   Alexander Iba, PA  cyclobenzaprine  (FLEXERIL ) 10 MG tablet Take 1 tablet (10 mg total) by mouth at bedtime. 02/14/24   Alexander Iba, PA  docusate sodium (COLACE) 100 MG capsule Take 100 mg by mouth as needed for mild constipation or moderate constipation. Patient not taking: Reported on 04/07/2024 06/25/23   [provider]  gabapentin (NEURONTIN)  300 MG capsule Take 300 mg by mouth 3 (three) times daily. 03/06/23   [provider]  glucose blood (ACCU-CHEK GUIDE TEST) test strip USE TO CHECK BLOOD SUGAR DAILY AND AS NEEDED 05/11/24   Alexander Iba, PA  Ibuprofen-Acetaminophen  (DUAL ACTION PAIN RELIEF PO) Take by mouth.    [provider]  LORazepam  (ATIVAN ) 1 MG tablet Take 0.5-1 tablets (0.5-1 mg total) by mouth at bedtime as needed. for sleep 11/07/23   Alexander Iba, PA  metoprolol  succinate (TOPROL  XL) 25 MG 24 hr tablet Take 1 tablet (25 mg total) by mouth in the morning and at bedtime. 09/16/23   Weaver, Scott T, PA-C  nitrofurantoin , macrocrystal-monohydrate, (MACROBID ) 100 MG capsule TAKE 1 CAPSULE BY MOUTH AT BEDTIME. 04/06/24   Alexander Iba, PA  QUEtiapine  (SEROQUEL ) 25 MG tablet Take 1 tablet (25 mg total) by mouth at bedtime. 02/05/24   Alexander Iba, PA  Semaglutide , 2 MG/DOSE, (OZEMPIC , 2 MG/DOSE,) 8 MG/3ML SOPN INJECT 2 MG INTO THE SKIN ONCE A WEEK. 04/17/24   Alexander Iba, PA  sertraline  (ZOLOFT ) 100 MG tablet Take 1 tablet (100 mg total) by mouth daily. 02/05/24   Alexander Iba, PA  zolpidem  (AMBIEN ) 10 MG tablet Take 1 tablet (10 mg total) by mouth at bedtime as needed. for sleep 02/05/24   Alexander Iba, PA    Allergies: Patient has no known allergies.    Review of Systems  Neurological:  Positive for headaches.  All other systems reviewed and are negative.   Updated Vital Signs BP 108/64   Pulse 81   Temp 97.7 F (  36.5 C) (Oral)   Resp 18   Ht 5' 2 (1.575 m)   Wt 54.4 kg   LMP  (LMP Unknown)   SpO2 94%   BMI 21.95 kg/m   Physical Exam Vitals and nursing note reviewed.  Constitutional:      General: She is not in acute distress.    Appearance: Normal appearance. She is not ill-appearing or diaphoretic.  HENT:     Head: Normocephalic and atraumatic.     Mouth/Throat:     Mouth: Mucous membranes are moist.     Pharynx: Oropharynx is clear. No oropharyngeal exudate or  posterior oropharyngeal erythema.   Eyes:     General: No scleral icterus.       Right eye: No discharge.        Left eye: No discharge.     Extraocular Movements: Extraocular movements intact.     Conjunctiva/sclera: Conjunctivae normal.     Pupils: Pupils are equal, round, and reactive to light.    Cardiovascular:     Rate and Rhythm: Normal rate and regular rhythm.     Pulses: Normal pulses.     Heart sounds: Normal heart sounds. No murmur heard.    No friction rub. No gallop.  Pulmonary:     Effort: Pulmonary effort is normal. No respiratory distress.     Breath sounds: Normal breath sounds. No stridor. No wheezing, rhonchi or rales.     Comments: Surgical scars noted be well-healing across chest wall with no erythema, drainage, swelling. Chest:     Chest wall: No tenderness.  Abdominal:     General: Abdomen is flat. There is no distension.     Palpations: Abdomen is soft.     Tenderness: There is no abdominal tenderness. There is no right CVA tenderness, left CVA tenderness, guarding or rebound.     Comments: Surgical scars noted to be well-healing to abdomen   Musculoskeletal:        General: No deformity.     Cervical back: Normal range of motion and neck supple. No rigidity or tenderness.     Right lower leg: No edema.     Left lower leg: No edema.   Skin:    General: Skin is warm and dry.     Findings: No lesion.   Neurological:     General: No focal deficit present.     Mental Status: She is alert and oriented to person, place, and time. Mental status is at baseline.     Sensory: No sensory deficit.     Motor: No weakness.     Coordination: Coordination normal.     Gait: Gait normal.     Comments: No facial asymmetry, no ataxia, no apraxia, no aphasia, normal sensation to both upper and lower extremities bilaterally, normal grip strength bilaterally, normal strength to both flexion and extension to both upper lower extremities 5+ bilaterally, no visual field  deficits, no nystagmus.   Psychiatric:        Mood and Affect: Mood normal.     (all labs ordered are listed, but only abnormal results are displayed) Labs Reviewed  CBC - Abnormal; Notable for the following components:      Result Value   RBC 3.64 (*)    Hemoglobin 11.9 (*)    All other components within normal limits  URINALYSIS, ROUTINE W REFLEX MICROSCOPIC - Abnormal; Notable for the following components:   Leukocytes,Ua SMALL (*)    Bacteria, UA RARE (*)  All other components within normal limits  LIPASE, BLOOD  COMPREHENSIVE METABOLIC PANEL WITH GFR    EKG: None  Radiology: CT Head Wo Contrast Result Date: 05/15/2024 CLINICAL DATA:  Headache EXAM: CT HEAD WITHOUT CONTRAST TECHNIQUE: Contiguous axial images were obtained from the base of the skull through the vertex without intravenous contrast. RADIATION DOSE REDUCTION: This exam was performed according to the departmental dose-optimization program which includes automated exposure control, adjustment of the mA and/or kV according to patient size and/or use of iterative reconstruction technique. COMPARISON:  None Available. FINDINGS: Brain: There is no mass, hemorrhage or extra-axial collection. The size and configuration of the ventricles and extra-axial CSF spaces are normal. There is hypoattenuation of the white matter, most commonly indicating chronic small vessel disease. Vascular: No hyperdense vessel or unexpected vascular calcification. Skull: The visualized skull base, calvarium and extracranial soft tissues are normal. Sinuses/Orbits: No fluid levels or advanced mucosal thickening of the visualized paranasal sinuses. No mastoid or middle ear effusion. Normal orbits. Other: None. IMPRESSION: 1. No acute intracranial abnormality. 2. Chronic small vessel disease. Electronically Signed   By: Juanetta Nordmann M.D.   On: 05/15/2024 23:03    Procedures   Medications Ordered in the ED  lactated ringers bolus 1,000 mL (1,000  mLs Intravenous New Bag/Given 05/15/24 2302)  prochlorperazine (COMPAZINE) injection 10 mg (10 mg Intravenous Given 05/15/24 2232)  diphenhydrAMINE (BENADRYL) injection 12.5 mg (12.5 mg Intravenous Given 05/15/24 2232)  dexamethasone (DECADRON) injection 10 mg (10 mg Intravenous Given 05/15/24 2232)  ondansetron  (ZOFRAN ) injection 4 mg (4 mg Intravenous Given 05/15/24 2233)  ketorolac  (TORADOL ) 15 MG/ML injection 15 mg (15 mg Intravenous Given 05/15/24 2332)                                Medical Decision Making Amount and/or Complexity of Data Reviewed Labs: ordered. Radiology: ordered.  Risk Prescription drug management.   This patient is a 53 year old female who presents to the ED for concern of headache with vomiting, noted to have recent exposure to GI bug through son living at the house.  Has had persistent vomiting for the last week, with last vomiting episode being Earlier today.  Has had a headache since then.  Headache began suddenly and had Increased intensity.  Noted to have also had recent respiratory surgery.  On physical exam, patient is in no acute distress, afebrile, alert and orient x 4, speaking in full sentences, nontachypneic, nontachycardic. Surgical sites appear to be well-healing without any signs of infection.  Normal neuroexam.  Exam is otherwise unremarkable.  Patient was provided migraine cocktail, with labs pending.  Labs were unremarkable, CT head was also unremarkable.  On reevaluation, patient notes that her symptoms have greatly improved.  She is 1 to go home at this time.  I have low suspicion for any emergent cause of her symptoms today as lab work and imaging are reassuring.  Wounds also appear to be well-healing.  Will have her continue follow-up with surgery for surgical site recheck as well as with PCP for further evaluation if persistent headache.  Patient does not wish to be discharged and medications that she has plenty at home.  Patient vital signs  have remained stable throughout the course of patient's time in the ED. Low suspicion for any other emergent pathology at this time. I believe this patient is safe to be discharged. Provided strict return to ER precautions. Patient expressed agreement and  understanding of plan. All questions were answered.  Differential diagnoses prior to evaluation: The emergent differential diagnosis includes, but is not limited to, CVA, press syndrome, embolus, migraine headache, cluster headache, tension headache, dehydration, electrolyte disturbance. This is not an exhaustive differential.   Past Medical History / Co-morbidities / Social History: Eczema, depression, lymphedema, breast cancer status post mastectomy and breast reconstructive surgery  Additional history: Chart reviewed. Pertinent results include:   Recent underwent bilateral nipple reconstruction on 05/01/2024.    Noted to have been seen previously by neurology on 04/13/2024 for some paresthesias status post chemotherapy and radiation.  Noted that no new workup required at this time.  And follow-up as needed.  No to be taking gabapentin.  Lab Tests/Imaging studies: I personally interpreted labs/imaging and the pertinent results include:   CBC notes a mild anemia with hemoglobin 11.9 CMP unremarkable Lipase unremarkable UA unremarkable CT head shows Chronic small vessel disease with no acute intracranial abnormalities I agree with the radiologist interpretation.  Medications: I ordered medication including Toradol , LR, Zofran , Decadron, Benadryl, Compazine.  I have reviewed the patients home medicines and have made adjustments as needed.  Critical Interventions: none  Social Determinants of Health: Patient has good follow-up with both surgery as well as PCP.  Disposition: After consideration of the diagnostic results and the patients response to treatment, I feel that the patient would benefit from discharge and treatment as above.    emergency department workup does not suggest an emergent condition requiring admission or immediate intervention beyond what has been performed at this time. The plan is: Rest, follow-up with surgery for surgical site recheck, follow-up PCP for any persistent symptoms, return to the ED for any new or worsening symptoms. The patient is safe for discharge and has been instructed to return immediately for worsening symptoms, change in symptoms or any other concerns.     Final diagnoses:  Acute nonintractable headache, unspecified headache type    ED Discharge Orders     None          Neizan Debruhl S, PA-C 05/15/24 2341    Tegeler, Marine Sia, MD 05/15/24 2356

## 2024-05-15 NOTE — Discharge Instructions (Signed)
 You were seen today for headache.  Your labs and imaging today were reassuring that have low suspicion for any emergent cause of your symptoms today.  Recommend continue to follow-up with your surgeon for wound reevaluation as well as with your PCP for further headache management for any persistent headaches.  Please return to the ED though if you been having new or worsening symptoms which would include headache with vision changes, chest pain with shortness of breath, persistent vomiting that is uncontrolled with Zofran , blood in stool.

## 2024-05-25 NOTE — Telephone Encounter (Signed)
 Carrie Richards, pt would like you to review results of imaging done 6/20.

## 2024-05-28 ENCOUNTER — Other Ambulatory Visit: Payer: Self-pay | Admitting: Physician Assistant

## 2024-06-09 ENCOUNTER — Encounter: Payer: Self-pay | Admitting: Physician Assistant

## 2024-06-26 ENCOUNTER — Other Ambulatory Visit: Payer: Self-pay | Admitting: Physician Assistant

## 2024-06-26 NOTE — Telephone Encounter (Signed)
 Pt requesting refill for Zolpidem  10 mg tablet. Last OV 02/05/2024.

## 2024-07-06 ENCOUNTER — Ambulatory Visit: Payer: Self-pay

## 2024-07-06 NOTE — Telephone Encounter (Signed)
 FYI Only or Action Required?: FYI only for provider.  Patient was last seen in primary care on 02/05/2024 by Job Lukes, PA.  Called Nurse Triage reporting Leg Pain.  Symptoms began several weeks ago.  Interventions attempted: OTC medications: tylenol  and ibuprofen  and Prescription medications: flexeril .  Symptoms are: unchanged.  Triage Disposition: See PCP When Office is Open (Within 3 Days)  Patient/caregiver understands and will follow disposition?: Yes    Copied from CRM [id number]. Topic: Clinical - Red Word Triage >> Jul 06, 2024  4:13 PM Carlyon D wrote: Red Word that prompted transfer to Nurse Triage: Severe leg pain in both legs the right leg has more pain and it seems to be getting worse. Reason for Disposition  [1] MODERATE pain (e.g., interferes with normal activities, limping) AND [2] present > 3 days  Answer Assessment - Initial Assessment Questions 1. ONSET: When did the pain start?      2-3 weeks ago  2. LOCATION: Where is the pain located?      Right leg 3. PAIN: How bad is the pain?    (Scale 1-10; or mild, moderate, severe)     7/10 but goes to 2/10 with medication 4. WORK OR EXERCISE: Has there been any recent work or exercise that involved this part of the body?      no 5. CAUSE: What do you think is causing the leg pain?     Popliteal cysts 6. OTHER SYMPTOMS: Do you have any other symptoms? (e.g., chest pain, back pain, breathing difficulty, swelling, rash, fever, numbness, weakness)     no  Protocols used: Leg Pain-A-AH

## 2024-07-07 ENCOUNTER — Ambulatory Visit: Admitting: Physician Assistant

## 2024-07-08 ENCOUNTER — Other Ambulatory Visit: Payer: Self-pay | Admitting: Physician Assistant

## 2024-07-10 ENCOUNTER — Ambulatory Visit: Payer: Self-pay | Admitting: Physician Assistant

## 2024-07-10 ENCOUNTER — Encounter: Payer: Self-pay | Admitting: Physician Assistant

## 2024-07-10 ENCOUNTER — Ambulatory Visit: Admitting: Physician Assistant

## 2024-07-10 ENCOUNTER — Ambulatory Visit (INDEPENDENT_AMBULATORY_CARE_PROVIDER_SITE_OTHER)

## 2024-07-10 ENCOUNTER — Other Ambulatory Visit: Payer: Self-pay | Admitting: Physician Assistant

## 2024-07-10 VITALS — BP 97/64 | HR 87 | Temp 97.9°F | Ht 62.0 in | Wt 127.6 lb

## 2024-07-10 DIAGNOSIS — M79605 Pain in left leg: Secondary | ICD-10-CM | POA: Diagnosis not present

## 2024-07-10 DIAGNOSIS — M79604 Pain in right leg: Secondary | ICD-10-CM

## 2024-07-10 DIAGNOSIS — M791 Myalgia, unspecified site: Secondary | ICD-10-CM

## 2024-07-10 DIAGNOSIS — M25562 Pain in left knee: Secondary | ICD-10-CM

## 2024-07-10 DIAGNOSIS — R0602 Shortness of breath: Secondary | ICD-10-CM | POA: Diagnosis not present

## 2024-07-10 DIAGNOSIS — Z1283 Encounter for screening for malignant neoplasm of skin: Secondary | ICD-10-CM

## 2024-07-10 LAB — CBC WITH DIFFERENTIAL/PLATELET
Basophils Absolute: 0 K/uL (ref 0.0–0.1)
Basophils Relative: 0.6 % (ref 0.0–3.0)
Eosinophils Absolute: 0.1 K/uL (ref 0.0–0.7)
Eosinophils Relative: 1.8 % (ref 0.0–5.0)
HCT: 37.6 % (ref 36.0–46.0)
Hemoglobin: 12.4 g/dL (ref 12.0–15.0)
Lymphocytes Relative: 29.7 % (ref 12.0–46.0)
Lymphs Abs: 1.3 K/uL (ref 0.7–4.0)
MCHC: 33 g/dL (ref 30.0–36.0)
MCV: 96.8 fl (ref 78.0–100.0)
Monocytes Absolute: 0.3 K/uL (ref 0.1–1.0)
Monocytes Relative: 6.6 % (ref 3.0–12.0)
Neutro Abs: 2.7 K/uL (ref 1.4–7.7)
Neutrophils Relative %: 61.3 % (ref 43.0–77.0)
Platelets: 234 K/uL (ref 150.0–400.0)
RBC: 3.88 Mil/uL (ref 3.87–5.11)
RDW: 12.7 % (ref 11.5–15.5)
WBC: 4.4 K/uL (ref 4.0–10.5)

## 2024-07-10 LAB — COMPREHENSIVE METABOLIC PANEL WITH GFR
ALT: 14 U/L (ref 0–35)
AST: 17 U/L (ref 0–37)
Albumin: 4.4 g/dL (ref 3.5–5.2)
Alkaline Phosphatase: 74 U/L (ref 39–117)
BUN: 15 mg/dL (ref 6–23)
CO2: 32 meq/L (ref 19–32)
Calcium: 9.6 mg/dL (ref 8.4–10.5)
Chloride: 104 meq/L (ref 96–112)
Creatinine, Ser: 0.79 mg/dL (ref 0.40–1.20)
GFR: 85.69 mL/min (ref >60.00)
Glucose, Bld: 80 mg/dL (ref 70–99)
Potassium: 4.3 meq/L (ref 3.5–5.1)
Sodium: 142 meq/L (ref 135–145)
Total Bilirubin: 0.6 mg/dL (ref 0.2–1.2)
Total Protein: 6.3 g/dL (ref 6.0–8.3)

## 2024-07-10 LAB — LIPID PANEL
Cholesterol: 128 mg/dL (ref 0–200)
HDL: 43.1 mg/dL (ref >39.00)
LDL Cholesterol: 61 mg/dL (ref 0–99)
NonHDL: 84.66
Total CHOL/HDL Ratio: 3
Triglycerides: 118 mg/dL (ref 0.0–149.0)
VLDL: 23.6 mg/dL (ref 0.0–40.0)

## 2024-07-10 LAB — CK: Total CK: 37 U/L (ref 17–177)

## 2024-07-10 MED ORDER — TRAMADOL HCL 50 MG PO TABS: 50.0000 mg | ORAL_TABLET | Freq: Four times a day (QID) | ORAL | 1 refills | Status: DC | PRN

## 2024-07-10 MED ORDER — GABAPENTIN 300 MG PO CAPS
300.0000 mg | ORAL_CAPSULE | Freq: Three times a day (TID) | ORAL | 1 refills | Status: DC
Start: 2024-07-10 — End: 2024-09-29

## 2024-07-10 MED ORDER — CYCLOBENZAPRINE HCL 10 MG PO TABS
10.0000 mg | ORAL_TABLET | Freq: Every day | ORAL | 1 refills | Status: DC
Start: 2024-07-10 — End: 2024-09-29

## 2024-07-10 NOTE — Progress Notes (Signed)
 Carrie Richards is a 53 y.o. female here for a follow up of a pre-existing problem.  History of Present Illness:   Chief Complaint  Patient presents with  . Leg Pain    Bilateral  No Hx injury     Discussed the use of AI scribe software for clinical note transcription with the patient, who gave verbal consent to proceed.  History of Present Illness Carrie Richards is a 53 year old female with chronic leg pain who presents with worsening leg pain.  She experiences chronic leg pain in both legs, with recent worsening, especially in the left leg. The pain is muscular and bony, severe enough to cause crying, and unrelieved by positional changes. It intensifies in the evening. Current medications include gabapentin  300 mg three times daily, Flexeril  10 mg three times daily as needed, and a dual-action ibuprofen/acetaminophen .   Flexeril  provides temporary relief, but the pain returns later in the day. Tramadol  is used occasionally for unbearable pain.   A bakers cyst in the right leg was diagnosed 4-5 years ago, considered too dangerous for surgical removal due to its location, with no recent treatment.  She has lost 100 pounds over the past year, attributed to Ozempic , currently at a maximum dose of 2 mg. She struggles to consume more than 900 calories daily.  She experiences episodes of low oxygen saturation, with levels dropping to 84%, and has difficulty breathing and pain on deep breaths.  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 She also works part-time from home for [employer] She feels tired and finds it challenging to focus at work due to job demands.    Past Medical History:  Diagnosis Date  . Anemia   . Anxiety   . Breast cancer (HCC)    tripple neg, chemo, radiation, b/l mastectomy follows in [employer]  . Breast disorder   . Depression   . H/O echocardiogram    TTE  10/14/2023: EF 60-65, no RWMA, GR 1 DD, GLS -22.4, normal RVSF, normal PASP, RVSP 15.4, RAP 3  . Hx of adenomatous polyp of colon 10/25/2023  . Lymphedema    post chemo  . 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 Shortness of breath on exertion      Social History   Tobacco Use  . Smoking status: Never  . Smokeless tobacco: Never  Vaping Use  . Vaping status: Never Used  Substance Use Topics  . Alcohol use: Yes    Alcohol/week: 2.0 standard drinks of alcohol    Types: 2 Standard drinks or equivalent per week    Comment: rarely  . Drug use: No    Past Surgical History:  Procedure Laterality Date  . ABDOMINAL HYSTERECTOMY     [facility] desires annual pap smears  . BREAST RECONSTRUCTION Bilateral   . BROW LIFT    . CESAREAN SECTION    . CHOLECYSTECTOMY    . COLONOSCOPY  09/2023  . MASTECTOMY Bilateral 02/07/2022  . WRIST SURGERY Right     Family History  Problem Relation Age of Onset  . Diabetes Mother   . Breast cancer Mother   . Ovarian cancer Mother   . Hypertension Mother   . Hyperlipidemia Mother   . Depression Mother   . Sleep apnea Mother   . Melanoma Father   . Hypertension Father   . Hyperlipidemia Father   . Diabetes Father   . Cancer Father   .  Sleep apnea Father   . Hypertension Sister   . Depression Sister   . Hypertension Sister   . Heart disease Sister   . Depression Sister   . Heart attack Maternal Grandmother   . Breast cancer Paternal Grandmother   . Colon cancer Neg Hx   . Colon polyps Neg Hx   . Esophageal cancer Neg Hx   . Rectal cancer Neg Hx   . Stomach cancer Neg Hx     No Known Allergies  Current Medications:   Current Outpatient Medications:  .  Accu-Chek FastClix Lancets MISC, USE TO CHECK BLOOD SUGAR DAILY AND AS NEEDED, Disp: 100 each, Rfl: 4 .  albuterol  (VENTOLIN  HFA) 108 (90 Base) MCG/ACT inhaler, Inhale into the lungs., Disp: , Rfl:  .  atorvastatin  (LIPITOR) 20 MG tablet, Take 1 tablet (20 mg total) by mouth daily., Disp: 90  tablet, Rfl: 3 .  glucose blood (ACCU-CHEK GUIDE TEST) test strip, USE TO CHECK BLOOD SUGAR DAILY AND AS NEEDED, Disp: 100 each, Rfl: 4 .  Ibuprofen-Acetaminophen  (DUAL ACTION PAIN RELIEF PO), Take by mouth., Disp: , Rfl:  .  LORazepam  (ATIVAN ) 1 MG tablet, Take 0.5-1 tablets (0.5-1 mg total) by mouth at bedtime as needed. for sleep, Disp: 30 tablet, Rfl: 1 .  metoprolol  succinate (TOPROL  XL) 25 MG 24 hr tablet, Take 1 tablet (25 mg total) by mouth in the morning and at bedtime., Disp: 180 tablet, Rfl: 3 .  nitrofurantoin , macrocrystal-monohydrate, (MACROBID ) 100 MG capsule, TAKE 1 CAPSULE BY MOUTH AT BEDTIME., Disp: 30 capsule, Rfl: 2 .  Semaglutide , 2 MG/DOSE, (OZEMPIC , 2 MG/DOSE,) 8 MG/3ML SOPN, INJECT 2 MG INTO THE SKIN ONCE A WEEK., Disp: 3 mL, Rfl: 2 .  sertraline  (ZOLOFT ) 100 MG tablet, Take 1 tablet (100 mg total) by mouth daily., Disp: 90 tablet, Rfl: 3 .  zolpidem  (AMBIEN ) 10 MG tablet, TAKE 1 TABLET BY MOUTH EVERY DAY AT BEDTIME AS NEEDED FOR SLEEP, Disp: 30 tablet, Rfl: 1 .  cyclobenzaprine  (FLEXERIL ) 10 MG tablet, Take 1 tablet (10 mg total) by mouth at bedtime., Disp: 90 tablet, Rfl: 1 .  gabapentin  (NEURONTIN ) 300 MG capsule, Take 1 capsule (300 mg total) by mouth 3 (three) times daily., Disp: 90 capsule, Rfl: 1 .  traMADol  (ULTRAM ) 50 MG tablet, Take 1 tablet (50 mg total) by mouth every 6 (six) hours as needed., Disp: 30 tablet, Rfl: 1   Review of Systems:   Negative unless otherwise specified per HPI.  Vitals:   Vitals:   07/10/24 0801  BP: 97/64  Pulse: 87  Temp: 97.9 F (36.6 C)  TempSrc: Temporal  SpO2: 94%  Weight: 127 lb 9.6 oz (57.9 kg)  Height: 5' 2 (1.575 m)     Body mass index is 23.34 kg/m.  Physical Exam:   Physical Exam Vitals and nursing note reviewed.  Constitutional:      General: She is not in acute distress.    Appearance: She is well-developed. She is not ill-appearing or toxic-appearing.  Cardiovascular:     Rate and Rhythm: Normal rate  and regular rhythm.     Pulses: Normal pulses.     Heart sounds: Normal heart sounds, S1 normal and S2 normal.  Pulmonary:     Effort: Pulmonary effort is normal.     Breath sounds: Normal breath sounds.  Musculoskeletal:     Comments: Tenderness to palpation to anterior left knee No swelling appreciated to bilateral knees/thighs  Skin:    General: Skin is warm and  dry.  Neurological:     Mental Status: She is alert.     GCS: GCS eye subscore is 4. GCS verbal subscore is 5. GCS motor subscore is 6.  Psychiatric:        Speech: Speech normal.        Behavior: Behavior normal. Behavior is cooperative.     Assessment and Plan:   Assessment and Plan Assessment & Plan Chronic bilateral leg pain and myalgia, worse on left, with left knee pain Pain is severe, potentially exacerbated by a ganglion cyst in the right leg and atorvastatin  side effects. Differential includes deconditioning and possible statin-induced myopathy. - Hold atorvastatin  to assess for statin-induced myopathy. - Order x-ray of the thigh and knee. - Refer to physical therapy, including aquatic therapy, if symptoms persist after holding atorvastatin . - Check blood work for rhabdomyolysis. - Refill gabapentin  and tramadol  for pain management.  Shortness of breath with hypoxemia and history of radiation-induced lung injury Oxygen levels drop to low 80s, with pain on deep inspiration, especially on the right side. Radiation-induced lung injury may contribute to symptoms. - Refer to pulmonology for evaluation of hypoxemia and potential radiation-induced lung injury. - Monitor oxygen levels and avoid cardiovascular exertion if oxygen levels are low.  Type 2 diabetes mellitus without complications Managed with semaglutide  (Ozempic ) at maximum dose. Difficulty monitoring blood glucose due to loss of glucometer. - Continue semaglutide  (Ozempic ) at current dose.  Reactive depression and adjustment insomnia Managed with  quetiapine  (Seroquel ) and zolpidem  (Ambien ). She is weaning off quetiapine  due to weight gain concerns. - Continue zolpidem  for sleep as needed. - Wean off quetiapine , considering potential weight gain as a side effect.    Lucie Buttner, PA-C

## 2024-07-10 NOTE — Patient Instructions (Signed)
 Wt Readings from Last 3 Encounters:  07/10/24 127 lb 9.6 oz (57.9 kg)  05/15/24 120 lb (54.4 kg)  04/07/24 127 lb (57.6 kg)

## 2024-07-15 ENCOUNTER — Encounter: Payer: Self-pay | Admitting: Physician Assistant

## 2024-07-16 NOTE — Telephone Encounter (Signed)
 Please see message.

## 2024-08-05 ENCOUNTER — Other Ambulatory Visit (HOSPITAL_COMMUNITY): Payer: Self-pay

## 2024-08-05 ENCOUNTER — Telehealth: Payer: Self-pay

## 2024-08-05 NOTE — Telephone Encounter (Signed)
 Pharmacy Patient Advocate Encounter   Received notification from Onbase that prior authorization for Ozempic  (2 MG/DOSE) 8MG /3ML pen-injectors is required/requested.   Insurance verification completed.   The patient is insured through [facility] .   Per test claim: PA required; PA submitted to above mentioned insurance via Latent Key/confirmation #/EOC [id number] Status is pending

## 2024-08-06 ENCOUNTER — Other Ambulatory Visit (HOSPITAL_COMMUNITY): Payer: Self-pay

## 2024-08-06 NOTE — Telephone Encounter (Signed)
 Pharmacy Patient Advocate Encounter  Received notification from [facility] that Prior Authorization for Ozempic  (2 MG/DOSE) 8MG /3ML pen-injectors  has been APPROVED from 08/05/24 to 08/05/25   PA #/Case ID/Reference #: [id number]

## 2024-08-24 ENCOUNTER — Ambulatory Visit (INDEPENDENT_AMBULATORY_CARE_PROVIDER_SITE_OTHER): Admitting: Pulmonary Disease

## 2024-08-24 ENCOUNTER — Encounter (HOSPITAL_BASED_OUTPATIENT_CLINIC_OR_DEPARTMENT_OTHER): Payer: Self-pay | Admitting: Pulmonary Disease

## 2024-08-24 ENCOUNTER — Other Ambulatory Visit (HOSPITAL_BASED_OUTPATIENT_CLINIC_OR_DEPARTMENT_OTHER): Payer: Self-pay

## 2024-08-24 VITALS — BP 99/65 | HR 87 | Ht 62.0 in | Wt 125.6 lb

## 2024-08-24 DIAGNOSIS — J45909 Unspecified asthma, uncomplicated: Secondary | ICD-10-CM | POA: Diagnosis not present

## 2024-08-24 DIAGNOSIS — R0602 Shortness of breath: Secondary | ICD-10-CM | POA: Diagnosis not present

## 2024-08-24 DIAGNOSIS — R0609 Other forms of dyspnea: Secondary | ICD-10-CM

## 2024-08-24 DIAGNOSIS — R918 Other nonspecific abnormal finding of lung field: Secondary | ICD-10-CM | POA: Diagnosis not present

## 2024-08-24 LAB — PULMONARY FUNCTION TEST
DL/VA % pred: 113 %
DL/VA: 4.94 ml/min/mmHg/L
DLCO cor % pred: 103 %
DLCO cor: 20.09 ml/min/mmHg
DLCO unc % pred: 100 %
DLCO unc: 19.44 ml/min/mmHg
FEF 25-75 Post: 1.66 L/s
FEF 25-75 Pre: 1.03 L/s
FEF2575-%Change-Post: 61 %
FEF2575-%Pred-Post: 65 %
FEF2575-%Pred-Pre: 40 %
FEV1-%Change-Post: 18 %
FEV1-%Pred-Post: 77 %
FEV1-%Pred-Pre: 65 %
FEV1-Post: 1.97 L
FEV1-Pre: 1.66 L
FEV1FVC-%Change-Post: 11 %
FEV1FVC-%Pred-Pre: 75 %
FEV6-%Change-Post: 0 %
FEV6-%Pred-Post: 88 %
FEV6-%Pred-Pre: 87 %
FEV6-Post: 2.77 L
FEV6-Pre: 2.76 L
FEV6FVC-%Pred-Post: 103 %
FEV6FVC-%Pred-Pre: 103 %
FVC-%Change-Post: 6 %
FVC-%Pred-Post: 90 %
FVC-%Pred-Pre: 85 %
FVC-Post: 2.93 L
FVC-Pre: 2.76 L
Post FEV1/FVC ratio: 67 %
Post FEV6/FVC ratio: 100 %
Pre FEV1/FVC ratio: 60 %
Pre FEV6/FVC Ratio: 100 %
RV % pred: 117 %
RV: 2.05 L
TLC % pred: 102 %
TLC: 4.89 L

## 2024-08-24 NOTE — Patient Instructions (Addendum)
 Shortness of breath --Reviewed pulmonary function tests which are consistent with asthma --Will start ICS/LABA after pharmacy check  Self-reported desaturations --No desatuations in office  Chest pain Tightness is likely related to asthma but if persistent, recommend re-establishing care with Cardiology

## 2024-08-24 NOTE — Patient Instructions (Signed)
 Full PFT performed today.

## 2024-08-24 NOTE — Progress Notes (Signed)
 Subjective:   PATIENT ID: Carrie Richards GENDER: female DOB: [birth year], MRN: [id number]  Chief Complaint  Patient presents with   Establish Care    Consult SOB, low oxygen levels    Reason for Visit: New consult for shortness of breath    Carrie Richards is a 53 y.o. female with a history of breast cancer who presents with shortness of breath. She was referred by her primary care doctor for evaluation of her shortness of breath.  She has experienced shortness of breath since 2022, beginning during chemotherapy for breast cancer. The sensation is described as an inability to 'catch her breath'. She has no history of asthma or bronchitis and has never smoked, though she was exposed to secondhand smoke and a wood-burning stove during childhood.  In 2023, a CT scan was performed to rule out pulmonary embolism, which was negative. She has used albuterol  and Xopenex inhalers, as well as a steroid inhaler, fluticasone, though the strength is not recalled. Despite these treatments, she continues to experience chest tightness, especially when attempting deep breaths. Denied wheezing and cough.  She reports episodes where her oxygen levels drop to the low seventies or high sixties, leading to syncope. These episodes occur even when she is awake. Supplemental oxygen has been used during these episodes, providing relief.  Her past medical history includes an irregular heartbeat. She has experienced syncope since 2022, evaluated by cardiology and attributed to vasovagal syncope. A normal echocardiogram and exercise stress test were noted, and a ZIO patch showed rare extra beats.  Social History: Never smoker Wood burning stove    08/24/2024 Discussed the use of AI scribe software for clinical note transcription with the patient, who gave verbal consent to proceed.  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   Shortness of breath on exertion      Family History  Problem Relation Age of Onset   Diabetes Mother    Breast cancer Mother    Ovarian cancer Mother    Hypertension Mother    Hyperlipidemia Mother    Depression Mother    Sleep apnea Mother    Melanoma Father    Hypertension Father    Hyperlipidemia Father    Diabetes Father    Cancer Father    Sleep apnea Father    Hypertension Sister    Depression Sister    Hypertension Sister    Heart disease Sister    Depression Sister    Heart attack Maternal Grandmother    Breast cancer Paternal Grandmother    Colon cancer Neg Hx    Colon polyps Neg Hx    Esophageal cancer Neg Hx    Rectal cancer Neg Hx    Stomach cancer Neg Hx      Social History   Occupational History   Occupation: Human Resourcees  Tobacco Use   Smoking status: Never   Smokeless tobacco: Never  Vaping Use   Vaping status: Never Used  Substance and Sexual Activity   Alcohol use: Yes    Alcohol/week: 2.0 standard drinks of alcohol    Types: 2 Standard drinks or equivalent per week    Comment: rarely   Drug use: No   Sexual activity: Yes    Partners: Male    Birth control/protection: Surgical    No Known Allergies   Outpatient Medications Prior to Visit  Medication Sig Dispense Refill   Accu-Chek FastClix Lancets MISC USE TO CHECK BLOOD SUGAR DAILY AND AS NEEDED 100 each 4   albuterol  (VENTOLIN  HFA) 108 (90 Base) MCG/ACT inhaler Inhale into the lungs.     atorvastatin  (LIPITOR) 20 MG tablet Take 1 tablet (20 mg total) by mouth daily. 90 tablet 3   cyclobenzaprine  (FLEXERIL ) 10 MG tablet Take 1 tablet (10 mg total) by  mouth at bedtime. 90 tablet 1   gabapentin  (NEURONTIN ) 300 MG capsule Take 1 capsule (300 mg total) by mouth 3 (three) times daily. 90 capsule 1   glucose blood (ACCU-CHEK GUIDE TEST) test strip USE TO CHECK BLOOD SUGAR DAILY AND AS NEEDED 100 each 4   Ibuprofen-Acetaminophen  (DUAL ACTION PAIN RELIEF PO) Take by mouth.     LORazepam  (ATIVAN ) 1 MG tablet Take 0.5-1 tablets (0.5-1 mg total) by mouth at bedtime as needed. for sleep 30 tablet 1   metoprolol  succinate (TOPROL  XL) 25 MG 24 hr tablet Take 1 tablet (25 mg total) by mouth in the morning and at bedtime. 180 tablet 3   nitrofurantoin , macrocrystal-monohydrate, (MACROBID ) 100 MG capsule TAKE 1 CAPSULE BY MOUTH AT BEDTIME. 30 capsule 2   Semaglutide , 2 MG/DOSE, (OZEMPIC , 2 MG/DOSE,) 8 MG/3ML SOPN INJECT 2 MG INTO THE SKIN ONCE A WEEK. 3 mL 2   sertraline  (ZOLOFT ) 100 MG tablet Take 1 tablet (100 mg total) by mouth daily. 90 tablet 3   traMADol  (ULTRAM ) 50 MG tablet Take 1 tablet (50 mg total) by mouth every 6 (six) hours as needed. 30 tablet 1   zolpidem  (AMBIEN ) 10 MG tablet TAKE 1 TABLET BY MOUTH EVERY DAY AT BEDTIME AS NEEDED FOR SLEEP 30 tablet 1   No facility-administered medications prior to visit.    Review of Systems  Constitutional:  Negative for chills, diaphoresis, fever, malaise/fatigue and weight loss.  HENT:  Negative for congestion.   Respiratory:  Positive for shortness of breath. Negative for cough, hemoptysis, sputum production and wheezing.   Cardiovascular:  Negative for chest pain (chest tightness), palpitations and leg swelling.     Objective:   Vitals:   08/24/24 0931  BP: 99/65  Pulse: 87  SpO2: 97%  Weight: 125 lb 9.6 oz (57 kg)  Height: 5' 2 (1.575 m)   SpO2: 97 %  Physical Exam: General: Well-appearing, no acute distress HENT: [place], AT Eyes: EOMI, no scleral icterus Respiratory: Clear to auscultation bilaterally.  No crackles, wheezing or rales Cardiovascular: RRR, -M/R/G, no  JVD Extremities:-Edema,-tenderness Neuro: AAO x4, CNII-XII grossly intact Psych: Normal mood, normal affect  Data Reviewed:  Imaging: CT CAP 06/02/24 at [facility] Mild diffuse bilateral bronchial wall thickening.  Minimal subpleural radiation fibrosis in the anterior right lung. No  pleural effusion or pneumothorax.   PFT: None on file  Labs: CBC    Component Value Date/Time   WBC 4.4 07/10/2024 0839   RBC 3.88 07/10/2024 0839   HGB 12.4 07/10/2024 0839   HGB 13.8 01/24/2021 1625   HCT 37.6 07/10/2024 0839   HCT 40.6 01/24/2021 1625   PLT 234.0 07/10/2024 0839  PLT 359 01/24/2021 1625   MCV 96.8 07/10/2024 0839   MCV 94 01/24/2021 1625   MCH 32.7 05/15/2024 1801   MCHC 33.0 07/10/2024 0839   RDW 12.7 07/10/2024 0839   RDW 11.6 (L) 01/24/2021 1625   LYMPHSABS 1.3 07/10/2024 0839   LYMPHSABS 2.6 01/24/2021 1625   MONOABS 0.3 07/10/2024 0839   EOSABS 0.1 07/10/2024 0839   EOSABS 0.2 01/24/2021 1625   BASOSABS 0.0 07/10/2024 0839   BASOSABS 0.1 01/24/2021 1625        Assessment & Plan:   Discussion: 53 year old female with hx breast cancer s/p chemoradiation who presents with longstanding shortness of breath >3 years. Associated with chest tightness and worsened with exertion. Described syncope seems consistent with vasovagal and may be preceded by asthma attack. PFTs obtained while in office and consistent with asthma. Discussed clinical course and management of asthma including bronchodilator regimen and action plan for exacerbation.    Assessment & Plan Chronic dyspnea with suspected hypoxemia and chest tightness secondary to asthma Chronic dyspnea post-cancer treatment with syncope and hypoxemia. Cardiac workup negative for significant issues. Differential includes vasovagal episodes and airway problems. Concern for hypoxemia during exertion. - Performed ambulatory walk test to assess exertional oxygen levels with no desaturations - Reviewed pulmonary function  tests which are consistent with asthma - If chest pain/tightness persistent, recommend re-establishing care with Cardiology  Mild bronchial thickening and minimal fibrosis on CT - Obtain CT scan from [facility] for further evaluation. Patient will request disc at her next visit  Asthma - Reviewed pulmonary function tests which are consistent with asthma - Will start ICS/LABA after pharmacy check    Health Maintenance Immunization History  Administered Date(s) Administered   Influenza-Unspecified 09/11/2023   MMRV 09/17/2023   Moderna Sars-Covid-2 Vaccination 07/26/2021   PFIZER(Purple Top)SARS-COV-2 Vaccination 02/22/2020, 03/14/2020, 10/27/2020   Tdap 07/18/2018   CT Lung Screen - not qualified  Orders Placed This Encounter  Procedures   Pulmonary function test    Standing Status:   Future    Expiration Date:   08/24/2025    Where should this test be performed?:   Outpatient Pulmonary    What type of PFT is being ordered?:   Full PFT  No orders of the defined types were placed in this encounter.   Return for after PFT.  I have spent a total time of 38-minutes on the day of the appointment reviewing prior documentation, coordinating care and discussing medical diagnosis and plan with the patient/family. Imaging, labs and tests included in this note have been reviewed and interpreted independently by me. This note is generated using [employer] programming. Patient/family has given consent.  Ellery Tash Slater Staff, MD [facility] 08/24/2024 10:17 AM

## 2024-08-24 NOTE — Progress Notes (Signed)
 Full PFT performed today.

## 2024-08-25 MED ORDER — BUDESONIDE-FORMOTEROL FUMARATE 80-4.5 MCG/ACT IN AERO: 2.0000 | INHALATION_SPRAY | Freq: Two times a day (BID) | RESPIRATORY_TRACT | 2 refills | Status: DC

## 2024-08-25 NOTE — Telephone Encounter (Signed)
Please advise on inhaler.

## 2024-09-08 ENCOUNTER — Other Ambulatory Visit: Payer: Self-pay | Admitting: Physician Assistant

## 2024-09-09 NOTE — Telephone Encounter (Signed)
 Pt requesting refill for Zolpidem  10 mg tablet. Last OV 07/10/2024.

## 2024-09-11 ENCOUNTER — Ambulatory Visit: Payer: Self-pay

## 2024-09-11 NOTE — Telephone Encounter (Signed)
 FYI Only or Action Required?: FYI only for provider. Agrees to UC with any worsening  Patient was last seen in primary care on 07/10/2024 by Job Lukes, PA.  Called Nurse Triage reporting Animal Bite.  Symptoms began yesterday.  Interventions attempted: OTC medications: peroxide and neosporin.  Symptoms are: stable.  Triage Disposition: See HCP Within 4 Hours (Or PCP Triage)  Patient/caregiver understands and will follow disposition?: Yes  Copied from CRM #[id number]. Topic: Clinical - Red Word Triage >> Sep 11, 2024 11:10 AM Alfonso ORN wrote: Red Word that prompted transfer to Nurse Triage: small dog bite on arm that has lymphedema and there is some redness Reason for Disposition  [1] Puncture wound or small cut AND [2] weak immune system (e.g., HIV positive, cancer chemo, splenectomy, organ transplant, chronic steroids)  Answer Assessment - Initial Assessment Questions Pt was playing with Richelle puppy and was taking its toy and teeth snagged her right forearm. Patient with limb restrictions due to Breast CA. Bled for about an hour. Area cleaned with Peroxide and Neosporin. States it has less than 1/4 inch red border around the puncture. She is concerned it might turn into lymphedema. She wants to watch it this weekend to see if the redness gets bigger or any swelling occurs. Advised to mark the redness and take pictures so she can tell. She agrees to immediately go to ED if worsens at all. She denies fever, swelling, or paresthesias to that arm.  Advised she would likely need a Tdap Booster due to over 5 years since last one.  1. APPEARANCE What does it look like?  (e.g., abrasion, bruise, puncture)      Redness about less than 1/4 inch around the site.  2. SIZE: How big is the bite? (e.g., inches, cm; or compare to size of coin, pea, grape, ping pong ball)      Small like 1 tooth caught  3. LOCATION: Where is the bite located?      Right forearm  4. ONSET: When did the bite  happen? (e.g., minutes, hours ago)      yesterday 5. ANIMAL: What type of animal caused the bite? Is the injury from a bite or a claw? If the animal is a dog or a cat, ask: Was it a pet or a stray? Was it acting ill or behaving strangely?     Puppy golden  6. RABIES VACCINE: For dog or cat bites, ask: Do you know if the pet is vaccinated against rabies?  (e.g., yes, no, overdue for rabies shot, unknown)     Fully vaxed 7. CIRCUMSTANCES: Tell me how this happened.      Playing and trying to take the toy  8. TETANUS: When was your last tetanus booster?     2019  Protocols used: Animal Bite-A-AH

## 2024-09-11 NOTE — Telephone Encounter (Signed)
 Carrie Richards, pt wants to know if she needs a Tetanus shot due to her history. Last Tdap was 07/18/2018. Please advise.

## 2024-09-11 NOTE — Telephone Encounter (Signed)
FYI, see Triage note. 

## 2024-09-11 NOTE — Telephone Encounter (Signed)
 Spoke to pt told her Carrie Richards saw Triage note and said she could do Virtual Urgent Care visit or e-visit if she needs addressed over the weekend if she does not feel like she needs to be seen in person. Pt verbalized understanding.

## 2024-09-15 ENCOUNTER — Other Ambulatory Visit (HOSPITAL_BASED_OUTPATIENT_CLINIC_OR_DEPARTMENT_OTHER): Payer: Self-pay

## 2024-09-15 ENCOUNTER — Ambulatory Visit (HOSPITAL_BASED_OUTPATIENT_CLINIC_OR_DEPARTMENT_OTHER): Admitting: Physical Therapy

## 2024-09-15 ENCOUNTER — Encounter (HOSPITAL_BASED_OUTPATIENT_CLINIC_OR_DEPARTMENT_OTHER): Payer: Self-pay

## 2024-09-15 MED ORDER — QUETIAPINE FUMARATE 25 MG PO TABS: 25.0000 mg | ORAL_TABLET | Freq: Every day | ORAL | 1 refills | Status: DC

## 2024-09-15 MED ORDER — ZOLPIDEM TARTRATE 10 MG PO TABS
10.0000 mg | ORAL_TABLET | Freq: Every evening | ORAL | 1 refills | Status: DC | PRN
  Filled 2024-09-29 – 2024-10-08 (×2): qty 30, 30d supply, fill #0

## 2024-09-15 MED ORDER — TRAMADOL HCL 50 MG PO TABS
50.0000 mg | ORAL_TABLET | Freq: Four times a day (QID) | ORAL | 1 refills | Status: AC | PRN
  Filled 2024-09-29: qty 30, 8d supply, fill #0

## 2024-09-15 MED ORDER — CYCLOBENZAPRINE HCL 10 MG PO TABS
10.0000 mg | ORAL_TABLET | Freq: Every day | ORAL | 1 refills | Status: AC
  Filled 2024-11-03: qty 90, 90d supply, fill #0

## 2024-09-15 MED ORDER — GABAPENTIN 300 MG PO CAPS
ORAL_CAPSULE | ORAL | 1 refills | Status: AC
  Filled 2024-10-09: qty 270, 90d supply, fill #0

## 2024-09-15 MED ORDER — SERTRALINE HCL 100 MG PO TABS
100.0000 mg | ORAL_TABLET | Freq: Every day | ORAL | 2 refills | Status: AC
  Filled 2024-11-21 – 2024-11-23 (×4): qty 90, 90d supply, fill #0

## 2024-09-15 MED ORDER — ACCU-CHEK FASTCLIX LANCETS MISC
4 refills | Status: AC
  Filled 2024-11-03: qty 102, 90d supply, fill #0

## 2024-09-15 MED ORDER — GLUCOSE BLOOD VI STRP
ORAL_STRIP | 4 refills | Status: AC
  Filled 2024-11-03: qty 100, 90d supply, fill #0

## 2024-09-15 MED ORDER — NITROFURANTOIN MONOHYD MACRO 100 MG PO CAPS
100.0000 mg | ORAL_CAPSULE | Freq: Every day | ORAL | 2 refills | Status: DC
  Filled 2024-09-15: qty 30, 30d supply, fill #0

## 2024-09-17 ENCOUNTER — Ambulatory Visit: Payer: Self-pay | Admitting: Obstetrics and Gynecology

## 2024-09-17 ENCOUNTER — Other Ambulatory Visit: Payer: Self-pay

## 2024-09-17 ENCOUNTER — Encounter: Payer: Self-pay | Admitting: Obstetrics and Gynecology

## 2024-09-17 ENCOUNTER — Other Ambulatory Visit: Payer: Self-pay | Admitting: Obstetrics and Gynecology

## 2024-09-17 ENCOUNTER — Other Ambulatory Visit (HOSPITAL_BASED_OUTPATIENT_CLINIC_OR_DEPARTMENT_OTHER): Payer: Self-pay

## 2024-09-17 ENCOUNTER — Ambulatory Visit (INDEPENDENT_AMBULATORY_CARE_PROVIDER_SITE_OTHER): Admitting: Obstetrics and Gynecology

## 2024-09-17 VITALS — BP 118/62 | HR 87 | Wt 126.0 lb

## 2024-09-17 DIAGNOSIS — N390 Urinary tract infection, site not specified: Secondary | ICD-10-CM

## 2024-09-17 DIAGNOSIS — N898 Other specified noninflammatory disorders of vagina: Secondary | ICD-10-CM

## 2024-09-17 DIAGNOSIS — E2839 Other primary ovarian failure: Secondary | ICD-10-CM

## 2024-09-17 DIAGNOSIS — M810 Age-related osteoporosis without current pathological fracture: Secondary | ICD-10-CM

## 2024-09-17 DIAGNOSIS — N9419 Other specified dyspareunia: Secondary | ICD-10-CM | POA: Diagnosis not present

## 2024-09-17 DIAGNOSIS — M8580 Other specified disorders of bone density and structure, unspecified site: Secondary | ICD-10-CM

## 2024-09-17 DIAGNOSIS — C50011 Malignant neoplasm of nipple and areola, right female breast: Secondary | ICD-10-CM

## 2024-09-17 DIAGNOSIS — N951 Menopausal and female climacteric states: Secondary | ICD-10-CM

## 2024-09-17 DIAGNOSIS — M8000XK Age-related osteoporosis with current pathological fracture, unspecified site, subsequent encounter for fracture with nonunion: Secondary | ICD-10-CM

## 2024-09-17 MED ORDER — CEFTRIAXONE SODIUM 1 G IJ SOLR
1.0000 g | Freq: Once | INTRAMUSCULAR | Status: AC
  Administered 2024-09-17: 1 g via INTRAMUSCULAR

## 2024-09-17 MED ORDER — FLUCONAZOLE 150 MG PO TABS
150.0000 mg | ORAL_TABLET | Freq: Once | ORAL | 0 refills | Status: AC
  Filled 2024-09-17 (×2): qty 1, 1d supply, fill #0

## 2024-09-17 MED ORDER — VEOZAH 45 MG PO TABS: 1.0000 | ORAL_TABLET | Freq: Every day | ORAL | 0 refills | Status: AC

## 2024-09-17 MED ORDER — DENOSUMAB 60 MG/ML ~~LOC~~ SOSY
60.0000 mg | PREFILLED_SYRINGE | Freq: Once | SUBCUTANEOUS | 0 refills | Status: AC
Start: 2024-09-17 — End: 2024-09-17

## 2024-09-17 MED ORDER — EVENITY 105 MG/1.17ML ~~LOC~~ SOSY
210.0000 mg | PREFILLED_SYRINGE | Freq: Once | SUBCUTANEOUS | 0 refills | Status: DC
Start: 2024-09-17 — End: 2024-09-17
  Filled 2024-09-17: qty 2.4, 1d supply, fill #0

## 2024-09-17 MED ORDER — INTRAROSA 6.5 MG VA INST: 1.0000 | VAGINAL_INSERT | Freq: Every evening | VAGINAL | 12 refills | Status: AC | PRN

## 2024-09-17 MED ORDER — EVENITY 105 MG/1.17ML ~~LOC~~ SOSY: 210.0000 mg | PREFILLED_SYRINGE | Freq: Once | SUBCUTANEOUS | 0 refills | Status: DC

## 2024-09-17 NOTE — Progress Notes (Signed)
 Per [facility] pharmacy Prolia may be covered but through [facility]. Patient needs evenity first. To try [facility] for evenity and may need PA through [facility] for Evenity Dr. Glennon

## 2024-09-18 ENCOUNTER — Other Ambulatory Visit (HOSPITAL_BASED_OUTPATIENT_CLINIC_OR_DEPARTMENT_OTHER): Payer: Self-pay

## 2024-09-18 ENCOUNTER — Encounter: Payer: Self-pay | Admitting: Obstetrics and Gynecology

## 2024-09-18 ENCOUNTER — Telehealth: Payer: Self-pay

## 2024-09-18 ENCOUNTER — Other Ambulatory Visit: Payer: Self-pay

## 2024-09-18 NOTE — Progress Notes (Signed)
   Acute Office Visit  Subjective:    Patient ID: Carrie Richards, female    DOB: [birth year], 53 y.o.   MRN: [id number]   HPI 53 y.o. presents today for Gynecologic Exam (Tightness and pain in vaginal area. Her Vaginal feels likes its closing up. Very painful with sex. While having sex it feels like she is being stabbed from the inside. While sitting she has pain and pressure. Once she stand ups the pain stops but she still has pressure. She states once she had a tummy tuck the pain started and the vaginal dryness. Pt had cancer. Urine is sometimes is dark. This has been going on for about 3 or 4 months ) . Patient with hysterectomy and breast cancer Patient also presents to discuss her dxa results and would like to begin bone support medication with ongoing risks with surgical menopause and breast cancer. Dxa with -1.3 frax normal in care everywhere.  Patient is also bothered by the hot flashes and is not sleeping.  No LMP recorded (lmp unknown). Patient has had a hysterectomy.    Review of Systems     Objective:    OBGyn Exam  BP 118/62   Pulse 87   Wt 126 lb (57.2 kg)   LMP  (LMP Unknown)   SpO2 97%   BMI 23.05 kg/m  Wt Readings from Last 3 Encounters:  09/17/24 126 lb (57.2 kg)  08/24/24 125 lb 9.6 oz (57 kg)  07/10/24 127 lb 9.6 oz (57.9 kg)        UA: UTI  Assessment & Plan:  Surgical menopause Breast cancer Dyspareunia Vaginal dryness Osteopenia with desire to treat  To begin veozzah. To check LFT now and repeat in 1,3,6,12 months to ensure normal lfts while on the medication UTI: rocephin IM given. UC pending. Discussed treatment may need changed based on the result of this Dyspareunia/vaginal dryness.  To begin 4 day course of imvexxy 4 micrograms daily then begin daily intrarosa. Discussed safety with both since vaginal, even with breast cancer. Discussed continued risk of UTI with vaginal dryness in menopause.  Samples of both given. Referral placed to PT as  well. Osteopenia: counseled on options with prolia, bisphosphonates and r/b/a/I of each discussed. Discussed coverage may be an issue with prolia, but we can try first. Prescription sent to [facility] pharm.  40 minutes spent on reviewing records, imaging,  and one on one patient time and counseling patient and documentation Dr. Glennon Richards Carrie Richards

## 2024-09-18 NOTE — Telephone Encounter (Signed)
 Covermymeds PA for Veozah 45MG  tablets   Key: [id number]  DX: N95.1

## 2024-09-18 NOTE — Telephone Encounter (Signed)
 Created in error

## 2024-09-18 NOTE — Progress Notes (Signed)
 Patient must fill with [facility] pharmacy. Provider will transfer prescription. Dis-enrolling.

## 2024-09-20 LAB — URINE CULTURE
MICRO NUMBER:: 17138667
SPECIMEN QUALITY:: ADEQUATE

## 2024-09-20 LAB — URINALYSIS, COMPLETE W/RFL CULTURE
Casts: NONE SEEN /LPF
Crystals: NONE SEEN /HPF
Glucose, UA: NEGATIVE
Nitrites, Initial: POSITIVE — AB
Specific Gravity, Urine: 1.023 (ref 1.001–1.035)
Yeast: NONE SEEN /HPF
pH: 5.5 (ref 5.0–8.0)

## 2024-09-20 LAB — CULTURE INDICATED

## 2024-09-21 ENCOUNTER — Other Ambulatory Visit: Payer: Self-pay | Admitting: *Deleted

## 2024-09-21 ENCOUNTER — Encounter: Payer: Self-pay | Admitting: Physician Assistant

## 2024-09-21 ENCOUNTER — Telehealth: Payer: Self-pay | Admitting: *Deleted

## 2024-09-21 MED ORDER — OZEMPIC (2 MG/DOSE) 8 MG/3ML ~~LOC~~ SOPN: 2.0000 mg | PEN_INJECTOR | SUBCUTANEOUS | 0 refills | Status: DC

## 2024-09-21 NOTE — Progress Notes (Signed)
 Left message to call [facility] Triage at [phone], option 4.

## 2024-09-21 NOTE — Telephone Encounter (Signed)
 Copied from CRM [id number]. Topic: General - Other >> Sep 18, 2024  4:52 PM Rea ORN wrote: Reason for CRM: Pt returned call to [facility]. Please call pt back at [phone].

## 2024-09-21 NOTE — Telephone Encounter (Signed)
 Routing update to Dr. Glennon.

## 2024-09-22 ENCOUNTER — Ambulatory Visit (HOSPITAL_BASED_OUTPATIENT_CLINIC_OR_DEPARTMENT_OTHER): Admitting: Physical Therapy

## 2024-09-25 ENCOUNTER — Other Ambulatory Visit: Payer: Self-pay | Admitting: Obstetrics and Gynecology

## 2024-09-25 DIAGNOSIS — E049 Nontoxic goiter, unspecified: Secondary | ICD-10-CM

## 2024-09-25 LAB — COMPREHENSIVE METABOLIC PANEL WITH GFR
AG Ratio: 3.3 (calc) — ABNORMAL HIGH (ref 1.0–2.5)
ALT: 9 U/L (ref 6–29)
AST: 12 U/L (ref 10–35)
Albumin: 5.2 g/dL — ABNORMAL HIGH (ref 3.6–5.1)
Alkaline phosphatase (APISO): 87 U/L (ref 37–153)
BUN: 17 mg/dL (ref 7–25)
CO2: 31 mmol/L (ref 20–32)
Calcium: 10.5 mg/dL — ABNORMAL HIGH (ref 8.6–10.4)
Chloride: 104 mmol/L (ref 98–110)
Creat: 0.71 mg/dL (ref 0.50–1.03)
Globulin: 1.6 g/dL — ABNORMAL LOW (ref 1.9–3.7)
Glucose, Bld: 80 mg/dL (ref 65–99)
Potassium: 4.7 mmol/L (ref 3.5–5.3)
Sodium: 142 mmol/L (ref 135–146)
Total Bilirubin: 0.3 mg/dL (ref 0.2–1.2)
Total Protein: 6.8 g/dL (ref 6.1–8.1)
eGFR: 102 mL/min/1.73m2 (ref >=60)

## 2024-09-25 LAB — PTH, INTACT (ICMA) AND IONIZED CALCIUM
Calcium: 10.2 mg/dL (ref 8.6–10.4)
PTH: 50 pg/mL (ref 16–77)

## 2024-09-25 LAB — VITAMIN D 25 HYDROXY (VIT D DEFICIENCY, FRACTURES): Vit D, 25-Hydroxy: 37 ng/mL (ref 30–100)

## 2024-09-29 ENCOUNTER — Encounter: Payer: Self-pay | Admitting: Physician Assistant

## 2024-09-29 ENCOUNTER — Other Ambulatory Visit (HOSPITAL_BASED_OUTPATIENT_CLINIC_OR_DEPARTMENT_OTHER): Payer: Self-pay

## 2024-09-29 ENCOUNTER — Ambulatory Visit: Admitting: Physician Assistant

## 2024-09-29 ENCOUNTER — Ambulatory Visit (HOSPITAL_BASED_OUTPATIENT_CLINIC_OR_DEPARTMENT_OTHER): Admitting: Physical Therapy

## 2024-09-29 VITALS — BP 100/70 | HR 87 | Temp 98.1°F | Ht 62.0 in | Wt 127.5 lb

## 2024-09-29 DIAGNOSIS — J45998 Other asthma: Secondary | ICD-10-CM

## 2024-09-29 DIAGNOSIS — R4189 Other symptoms and signs involving cognitive functions and awareness: Secondary | ICD-10-CM

## 2024-09-29 DIAGNOSIS — E119 Type 2 diabetes mellitus without complications: Secondary | ICD-10-CM | POA: Diagnosis not present

## 2024-09-29 DIAGNOSIS — Z7985 Long-term (current) use of injectable non-insulin antidiabetic drugs: Secondary | ICD-10-CM

## 2024-09-29 DIAGNOSIS — F5102 Adjustment insomnia: Secondary | ICD-10-CM

## 2024-09-29 DIAGNOSIS — M791 Myalgia, unspecified site: Secondary | ICD-10-CM | POA: Diagnosis not present

## 2024-09-29 DIAGNOSIS — F329 Major depressive disorder, single episode, unspecified: Secondary | ICD-10-CM

## 2024-09-29 DIAGNOSIS — D239 Other benign neoplasm of skin, unspecified: Secondary | ICD-10-CM | POA: Insufficient documentation

## 2024-09-29 LAB — SEDIMENTATION RATE: Sed Rate: 1 mm/h (ref 0–30)

## 2024-09-29 LAB — C-REACTIVE PROTEIN: CRP: <0.5 mg/dL (ref 0.5–20.0)

## 2024-09-29 LAB — HEMOGLOBIN A1C: Hgb A1c MFr Bld: 5.4 % (ref 4.6–6.5)

## 2024-09-29 NOTE — Progress Notes (Signed)
 Carrie Richards is a 53 y.o. female here for a follow up of a pre-existing problem.  History of Present Illness:   Chief Complaint  Patient presents with   Medical Management of Chronic Issues    Pt here for f/u Diabetes.    Discussed the use of AI scribe software for clinical note transcription with the patient, who gave verbal consent to proceed.  History of Present Illness   Carrie Richards is a 53 year old female who presents with concerns about her mental and physical health, including pain and sleep disturbances.  She experiences ongoing mental health challenges, feeling overwhelmed by family stress, particularly concerning her youngest son. She denies suicidal ideation. Sleep disturbances persist, with effective sleep only achieved using Seroquel , Tylenol  PM, or Ambien . Regular use of Ambien  and occasional daytime lorazepam  are noted. Reducing Seroquel  worsens her sleep quality.  She suffers from significant physical pain in her shoulders, mid-back, and hips, severe enough to cause nocturnal crying. The pain's origin is unclear, and she has a history of bone density issues and a family history of autoimmune conditions. Tramadol  is used for severe pain, while gabapentin  is ineffective for pain relief.  Asthma is managed with Symbicort twice daily, improving her breathing, though she still experiences episodes of syncope, such as during a recent cancer walk. She maintains hydration and nutrition.  Diabetes is managed with Ozempic , with dosage adjustments due to satiety. Blood sugar levels slightly increase with reduced Ozempic  dosage. She monitors these levels regularly.        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   Shortness of breath on exertion      Social History   Tobacco Use   Smoking status: Never   Smokeless tobacco: Never  Vaping Use   Vaping status: Never Used  Substance Use Topics   Alcohol use: Yes    Alcohol/week: 2.0 standard drinks of alcohol    Types: 2 Standard drinks or equivalent per week    Comment: rarely   Drug use: No    Past Surgical History:  Procedure Laterality Date   ABDOMINAL HYSTERECTOMY     [facility] desires annual pap smears   BREAST RECONSTRUCTION Bilateral    BREAST RECONSTRUCTION  04/2024   clean up of breast reconstruction   BREAST RECONSTRUCTION  11/15/2023   placed aerolas   BROW LIFT     CESAREAN SECTION     CHOLECYSTECTOMY     COLONOSCOPY  09/2023   MASTECTOMY Bilateral 02/07/2022   WRIST SURGERY Right     Family History  Problem Relation Age of Onset   Diabetes Mother    Breast cancer Mother    Ovarian cancer Mother    Hypertension Mother    Hyperlipidemia Mother    Depression Mother    Sleep apnea Mother    Melanoma Father    Hypertension Father    Hyperlipidemia Father    Diabetes Father    Cancer Father    Sleep apnea Father  Hypertension Sister    Depression Sister    Hypertension Sister    Heart disease Sister    Depression Sister    Heart attack Maternal Grandmother    Breast cancer Paternal Grandmother    Colon cancer Neg Hx    Colon polyps Neg Hx    Esophageal cancer Neg Hx    Rectal cancer Neg Hx    Stomach cancer Neg Hx     No Known Allergies  Current Medications:   Current Outpatient Medications:    Accu-Chek FastClix Lancets MISC, USE TO CHECK BLOOD SUGAR DAILY AND AS NEEDED, Disp: 100 each, Rfl: 4   Accu-Chek FastClix Lancets MISC, Use to check blood sugar daily and as needed., Disp: 102 each, Rfl: 4   albuterol  (VENTOLIN  HFA) 108 (90 Base) MCG/ACT inhaler, Inhale into  the lungs., Disp: , Rfl:    budesonide-formoterol (SYMBICORT) 80-4.5 MCG/ACT inhaler, Inhale 2 puffs into the lungs 2 (two) times daily., Disp: 1 each, Rfl: 2   cyclobenzaprine  (FLEXERIL ) 10 MG tablet, Take 1 tablet (10 mg total) by mouth at bedtime., Disp: 90 tablet, Rfl: 1   Fezolinetant (VEOZAH) 45 MG TABS, Take 1 tablet (45 mg total) by mouth daily. Must do liver enzymes q month for refill. She has [employer] that helps with medication costs, Disp: 30 tablet, Rfl: 0   gabapentin  (NEURONTIN ) 300 MG capsule, Take 1 capsule (300 mg total) by mouth in the morning AND 2 capsules (600 mg total) at bedtime., Disp: 360 capsule, Rfl: 1   glucose blood (ACCU-CHEK GUIDE TEST) test strip, USE TO CHECK BLOOD SUGAR DAILY AND AS NEEDED, Disp: 100 each, Rfl: 4   glucose blood test strip, Use to check blood sugar daily and as needed., Disp: 100 each, Rfl: 4   Ibuprofen-Acetaminophen  (DUAL ACTION PAIN RELIEF PO), Take by mouth., Disp: , Rfl:    LORazepam  (ATIVAN ) 1 MG tablet, Take 0.5-1 tablets (0.5-1 mg total) by mouth at bedtime as needed. for sleep, Disp: 30 tablet, Rfl: 1   metoprolol  succinate (TOPROL  XL) 25 MG 24 hr tablet, Take 1 tablet (25 mg total) by mouth in the morning and at bedtime., Disp: 180 tablet, Rfl: 3   nitrofurantoin , macrocrystal-monohydrate, (MACROBID ) 100 MG capsule, Take 1 capsule (100 mg total) by mouth at bedtime., Disp: 30 capsule, Rfl: 2   Prasterone (INTRAROSA) 6.5 MG INST, Place 1 suppository vaginally at bedtime as needed., Disp: 30 each, Rfl: 12   QUEtiapine  (SEROQUEL ) 25 MG tablet, Take 1 tablet (25 mg total) by mouth at bedtime. (Patient taking differently: Take 25 mg by mouth as needed.), Disp: 90 tablet, Rfl: 1   Semaglutide , 2 MG/DOSE, (OZEMPIC , 2 MG/DOSE,) 8 MG/3ML SOPN, Inject 2 mg into the skin once a week., Disp: 3 mL, Rfl: 0   sertraline  (ZOLOFT ) 100 MG tablet, Take 1 tablet (100 mg total) by mouth daily., Disp: 90 tablet, Rfl: 2   traMADol  (ULTRAM ) 50 MG tablet, Take 1  tablet (50 mg total) by mouth every 6 (six) hours as needed., Disp: 30 tablet, Rfl: 1   zolpidem  (AMBIEN ) 10 MG tablet, Take 1 tablet (10 mg total) by mouth at bedtime as needed., Disp: 30 tablet, Rfl: 1   Review of Systems:   Negative unless otherwise specified per HPI.  Vitals:   Vitals:   09/29/24 0850  BP: 100/70  Pulse: 87  Temp: 98.1 F (36.7 C)  TempSrc: Temporal  SpO2: 99%  Weight: 127 lb 8 oz (57.8 kg)  Height: 5' 2 (1.575 m)  Body mass index is 23.32 kg/m.  Physical Exam:   Physical Exam Vitals and nursing note reviewed.  Constitutional:      General: She is not in acute distress.    Appearance: She is well-developed. She is not ill-appearing or toxic-appearing.  Cardiovascular:     Rate and Rhythm: Normal rate and regular rhythm.     Pulses: Normal pulses.     Heart sounds: Normal heart sounds, S1 normal and S2 normal.  Pulmonary:     Effort: Pulmonary effort is normal.     Breath sounds: Normal breath sounds.  Skin:    General: Skin is warm and dry.  Neurological:     Mental Status: She is alert.     GCS: GCS eye subscore is 4. GCS verbal subscore is 5. GCS motor subscore is 6.  Psychiatric:        Speech: Speech normal.        Behavior: Behavior normal. Behavior is cooperative.     Assessment and Plan:   Assessment and Plan    Type 2 diabetes mellitus Well-controlled with stable blood glucose levels. Occasional spikes when Ozempic  dose is reduced. Weight stable within healthy range. - Continue current diabetes management regimen. - Monitor blood glucose levels regularly. - Consider reducing Ozempic  dose if fullness persists.  Myalgia Severe pain in shoulders, mid-back, and hips affecting daily activities. Limited relief from tramadol  and gabapentin . No recent autoimmune panel conducted. - Ordered blood work to evaluate for autoimmune conditions. - Consider referral to rheumatologist if blood work is abnormal. - Explore [facility] program for  potential coverage of physical therapy or acupuncture.  Concern about memory Memory and cognitive function concerns with difficulty remembering daily activities and getting lost while driving. No formal cognitive testing conducted. - Ordered neuropsychological testing to assess cognitive function. - Continue memory exercises with daughter's support.  Reactive depression; Adjustment Insomnia Ongoing depressive symptoms exacerbated by family stressors. No current suicidal ideation or plan. - Continue current management and support from daughter. - reports there is no suicidal ideation - I discussed with patient that if they develop any SI, to tell someone immediately and seek medical attention.  Persistent asthma with undetermined severity Managed with Symbicort, improved breathing. Oxygen levels drop despite use, possibly related to dehydration. - Continue Symbicort as prescribed. - Monitor oxygen levels and hydration status.   Lucie Buttner, PA-C

## 2024-09-30 LAB — HLA-B27 ANTIGEN: HLA-B27 Antigen: NEGATIVE

## 2024-09-30 LAB — RHEUMATOID FACTOR: Rheumatoid fact SerPl-aCnc: <10 [IU]/mL (ref <14)

## 2024-09-30 LAB — ANA: Anti Nuclear Antibody (ANA): NEGATIVE

## 2024-10-01 LAB — CYCLIC CITRUL PEPTIDE ANTIBODY, IGG/IGA: Cyclic Citrullin Peptide Ab: 8 U (ref 0–19)

## 2024-10-02 ENCOUNTER — Ambulatory Visit: Payer: Self-pay | Admitting: Physician Assistant

## 2024-10-02 ENCOUNTER — Ambulatory Visit (HOSPITAL_BASED_OUTPATIENT_CLINIC_OR_DEPARTMENT_OTHER)
Admission: RE | Admit: 2024-10-02 | Discharge: 2024-10-02 | Disposition: A | Discharge status: Home / Self Care | Source: Ambulatory Visit | Attending: Obstetrics and Gynecology | Admitting: Obstetrics and Gynecology

## 2024-10-02 DIAGNOSIS — E049 Nontoxic goiter, unspecified: Secondary | ICD-10-CM | POA: Insufficient documentation

## 2024-10-05 ENCOUNTER — Ambulatory Visit: Payer: Self-pay | Admitting: Obstetrics and Gynecology

## 2024-10-06 ENCOUNTER — Ambulatory Visit (HOSPITAL_BASED_OUTPATIENT_CLINIC_OR_DEPARTMENT_OTHER): Admitting: Physical Therapy

## 2024-10-08 ENCOUNTER — Other Ambulatory Visit: Payer: Self-pay | Admitting: Physician Assistant

## 2024-10-08 ENCOUNTER — Other Ambulatory Visit (HOSPITAL_BASED_OUTPATIENT_CLINIC_OR_DEPARTMENT_OTHER): Payer: Self-pay

## 2024-10-09 ENCOUNTER — Other Ambulatory Visit (HOSPITAL_BASED_OUTPATIENT_CLINIC_OR_DEPARTMENT_OTHER): Payer: Self-pay

## 2024-10-09 ENCOUNTER — Other Ambulatory Visit: Payer: Self-pay | Admitting: Physician Assistant

## 2024-10-09 MED ORDER — QUETIAPINE FUMARATE 25 MG PO TABS
25.0000 mg | ORAL_TABLET | Freq: Every day | ORAL | 1 refills | Status: AC
  Filled 2024-10-09: qty 30, 30d supply, fill #0
  Filled 2024-11-03 (×2): qty 30, 30d supply, fill #1
  Filled 2024-12-26: qty 30, 30d supply, fill #2

## 2024-10-09 MED ORDER — ZOLPIDEM TARTRATE 10 MG PO TABS
10.0000 mg | ORAL_TABLET | Freq: Every evening | ORAL | 0 refills | Status: AC | PRN
  Filled 2024-10-09: qty 90, 90d supply, fill #0

## 2024-10-09 MED ORDER — METOPROLOL SUCCINATE ER 25 MG PO TB24
25.0000 mg | ORAL_TABLET | Freq: Two times a day (BID) | ORAL | 0 refills | Status: DC
  Filled 2024-10-09: qty 60, 30d supply, fill #0

## 2024-10-09 NOTE — Telephone Encounter (Signed)
 Last Visit on 09/29/2024 Last refill done on 09/09/2024 #30 with 1 refill

## 2024-10-14 ENCOUNTER — Ambulatory Visit (HOSPITAL_BASED_OUTPATIENT_CLINIC_OR_DEPARTMENT_OTHER): Admitting: Pulmonary Disease

## 2024-10-14 ENCOUNTER — Other Ambulatory Visit (HOSPITAL_BASED_OUTPATIENT_CLINIC_OR_DEPARTMENT_OTHER): Payer: Self-pay

## 2024-10-14 ENCOUNTER — Other Ambulatory Visit: Payer: Self-pay | Admitting: Physician Assistant

## 2024-10-14 MED ORDER — NITROFURANTOIN MONOHYD MACRO 100 MG PO CAPS
100.0000 mg | ORAL_CAPSULE | Freq: Every day | ORAL | 2 refills | Status: AC
  Filled 2024-10-14: qty 30, 30d supply, fill #0
  Filled 2024-11-14: qty 30, 30d supply, fill #1
  Filled 2024-11-21 – 2024-12-14 (×2): qty 30, 30d supply, fill #2

## 2024-10-14 NOTE — Telephone Encounter (Signed)
 Call received from [facility] at [facility] -[phone].   Rx received for Prolia and Evenity on 09/17/24.   Requesting dosing confirmation, directions and ICD 10 for Evenity.   Requesting ICD 10 for Prolia.    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

## 2024-10-14 NOTE — Telephone Encounter (Signed)
 Carrie Richards

## 2024-10-14 NOTE — Telephone Encounter (Signed)
 Left message to call [facility] Triage at [phone], option 4.

## 2024-10-15 ENCOUNTER — Other Ambulatory Visit: Payer: Self-pay

## 2024-10-15 ENCOUNTER — Telehealth: Payer: Self-pay

## 2024-10-15 MED ORDER — DENOSUMAB 60 MG/ML ~~LOC~~ SOSY: 60.0000 mg | PREFILLED_SYRINGE | SUBCUTANEOUS | Status: AC

## 2024-10-15 NOTE — Telephone Encounter (Signed)
 Spoke with patient.  Patient states she has spoken to oncologist and ok to proceed with Prolia.   Advised RX was originally sent to [facility]. Patient has not received a call from pharmacy and patient has not reviewed her benefits.   Reviewed with patient we have implemented a new benefits review process, if agreeable, will proceed with review of benefits by [facility] to determine benefits first, can keep Rx on file for Prolia at [facility] until benefits have been checked patient agreeable to plan. Advised [facility] will be the next to contact her. Patient agreeable.   Call returned to [facility], left detailed message on provider line. Requested to cancel Evenity Rx and hold Prolia Rx. Provided ICD 10: M85.80   Labs:  Calcium  10.5 on 09/17/24 Creat 0.71 on 09/17/24 PTH, Intact and calcium  10.2 on 09/17/24  I do not see a copy of BMD results in chart.   Damien or Dr. Glennon -do you have them to be scanned in?

## 2024-10-15 NOTE — Telephone Encounter (Signed)
 Prolia CAM order placed and Referral to Pharmacotherapy placed.   Routing to [employer].   Encounter closed.

## 2024-10-15 NOTE — Telephone Encounter (Signed)
 Prolia  VOB initiated via [email or web address]  Next Prolia  inj DUE: NEW START

## 2024-10-20 ENCOUNTER — Other Ambulatory Visit: Payer: Self-pay | Admitting: Physician Assistant

## 2024-10-20 ENCOUNTER — Other Ambulatory Visit (HOSPITAL_COMMUNITY): Payer: Self-pay

## 2024-10-20 ENCOUNTER — Other Ambulatory Visit (HOSPITAL_BASED_OUTPATIENT_CLINIC_OR_DEPARTMENT_OTHER): Payer: Self-pay

## 2024-10-20 MED ORDER — OZEMPIC (2 MG/DOSE) 8 MG/3ML ~~LOC~~ SOPN
2.0000 mg | PEN_INJECTOR | SUBCUTANEOUS | 2 refills | Status: DC
  Filled 2024-10-20: qty 3, 28d supply, fill #0
  Filled 2024-11-21 – 2024-11-23 (×3): qty 3, 28d supply, fill #1
  Filled 2024-12-11 – 2024-12-14 (×3): qty 3, 28d supply, fill #2

## 2024-10-20 NOTE — Telephone Encounter (Signed)
 SABRA

## 2024-10-20 NOTE — Telephone Encounter (Addendum)
 PHARMACY PA STARTED VIA LATENT. KEY: [id number]

## 2024-10-23 ENCOUNTER — Other Ambulatory Visit (HOSPITAL_BASED_OUTPATIENT_CLINIC_OR_DEPARTMENT_OTHER): Payer: Self-pay

## 2024-10-27 ENCOUNTER — Ambulatory Visit (HOSPITAL_BASED_OUTPATIENT_CLINIC_OR_DEPARTMENT_OTHER): Admitting: Pulmonary Disease

## 2024-10-29 ENCOUNTER — Other Ambulatory Visit (HOSPITAL_COMMUNITY): Payer: Self-pay

## 2024-10-29 NOTE — Telephone Encounter (Signed)
 Called insurance to check status of PA. Per representative, they did not receive the PA. Representavive started a new PA via CMM with a new key. Submitted PA, waiting for determination.    New Key: [id number]

## 2024-10-30 NOTE — Telephone Encounter (Signed)
 RESUBMITTED IN [employer] UNDER M85.80 DIAGNOSIS CODE

## 2024-10-30 NOTE — Telephone Encounter (Deleted)
 Buy/Bill (Office supplied medication)  Out-of-pocket cost due at time of clinic visit: $382 + DEDUCTIBLE  Number of injection/visits approved: ---  Primary: [employer] Co-insurance: 20% Admin fee co-insurance: $50  Secondary: --- Co-insurance:  Admin fee co-insurance:   Medical Benefit Details: Date Benefits were checked: 10/20/24 Deductible: $63.96 Met of $3000 Required/ Coinsurance: 20%/ Admin Fee: $50  Prior Auth: N/A PA# Expiration Date:   # of doses approved: -----------------------------------------------------------------------  Patient IS eligible for Copay Card. Copay Card can make patient's cost as little as $25. Link to apply: [email or web address]://[email or web address]  ** This summary of benefits is an estimation of the patient's out-of-pocket cost. Exact cost may very based on individual plan coverage.

## 2024-10-30 NOTE — Telephone Encounter (Signed)
 PHARMACY PA   DENIED -

## 2024-11-02 ENCOUNTER — Other Ambulatory Visit: Payer: Self-pay | Admitting: Physician Assistant

## 2024-11-03 ENCOUNTER — Encounter: Payer: Self-pay | Admitting: Physician Assistant

## 2024-11-03 ENCOUNTER — Encounter (HOSPITAL_BASED_OUTPATIENT_CLINIC_OR_DEPARTMENT_OTHER): Payer: Self-pay | Admitting: Pulmonary Disease

## 2024-11-03 ENCOUNTER — Ambulatory Visit (INDEPENDENT_AMBULATORY_CARE_PROVIDER_SITE_OTHER): Admitting: Pulmonary Disease

## 2024-11-03 ENCOUNTER — Other Ambulatory Visit (HOSPITAL_BASED_OUTPATIENT_CLINIC_OR_DEPARTMENT_OTHER): Payer: Self-pay

## 2024-11-03 VITALS — BP 124/78 | HR 87 | Ht 62.0 in | Wt 129.6 lb

## 2024-11-03 DIAGNOSIS — J4530 Mild persistent asthma, uncomplicated: Secondary | ICD-10-CM

## 2024-11-03 MED ORDER — ALBUTEROL SULFATE HFA 108 (90 BASE) MCG/ACT IN AERS
1.0000 | INHALATION_SPRAY | RESPIRATORY_TRACT | 1 refills | Status: AC | PRN
  Filled 2024-11-03: qty 6.7, 17d supply, fill #0
  Filled 2024-12-14 (×2): qty 6.7, 17d supply, fill #1

## 2024-11-03 MED ORDER — BUDESONIDE-FORMOTEROL FUMARATE 80-4.5 MCG/ACT IN AERO
2.0000 | INHALATION_SPRAY | Freq: Two times a day (BID) | RESPIRATORY_TRACT | 5 refills | Status: DC
  Filled 2024-11-03: qty 10.2, 30d supply, fill #0

## 2024-11-03 NOTE — Telephone Encounter (Signed)
 Routing to Dr. Karma Greaser to review and advise.

## 2024-11-03 NOTE — Telephone Encounter (Signed)
 Okay for referral to GI for Endoscopy?

## 2024-11-03 NOTE — Patient Instructions (Addendum)
 Asthma --CONTINUE generic Symbicort  TWO puffs in morning and evening --CONTINUE Albuterol  1-2 puffs AS NEEDED for shortness of breath, wheezing and chest tightness

## 2024-11-03 NOTE — Progress Notes (Signed)
 Subjective:   PATIENT ID: Carrie Richards GENDER: female DOB: [birth year], MRN: [id number]  Chief Complaint  Patient presents with   Shortness of Breath    Reason for Visit: Follow-up shortness of breath    Carrie Richards is a 53 y.o. female with a history of breast cancer who presents with shortness of breath. She was referred by her primary care doctor for evaluation of her shortness of breath.  Initial consult 2025 She has experienced shortness of breath since 2022, beginning during chemotherapy for breast cancer. The sensation is described as an inability to 'catch her breath'. She has no history of asthma or bronchitis and has never smoked, though she was exposed to secondhand smoke and a wood-burning stove during childhood.  In 2023, a CT scan was performed to rule out pulmonary embolism, which was negative. She has used albuterol  and Xopenex inhalers, as well as a steroid inhaler, fluticasone, though the strength is not recalled. Despite these treatments, she continues to experience chest tightness, especially when attempting deep breaths. Denied wheezing and cough.  She reports episodes where her oxygen levels drop to the low seventies or high sixties, leading to syncope. These episodes occur even when she is awake. Supplemental oxygen has been used during these episodes, providing relief.  Her past medical history includes an irregular heartbeat. She has experienced syncope since 2022, evaluated by cardiology and attributed to vasovagal syncope. A normal echocardiogram and exercise stress test were noted, and a ZIO patch showed rare extra beats.  2025 - PFT consistent with asthma. Improved after starting generic Symbicort . Still having syncope  Social History: Never smoker Wood burning stove    11/03/2024 Discussed the use of AI scribe software for clinical note transcription with the patient, who gave verbal consent to proceed.  History of Present Illness Carrie Richards is a  53 year old female who presents for follow-up on her inhaler use and breathing symptoms.  She has been using Symbicort  consistently every morning and night, which has improved her shortness of breath. She can walk longer distances without significant issues, although she is not actively exercising. No hoarseness or issues using the inhaler are reported.  She experiences episodes of feeling woozy, especially when standing up too quickly or climbing stairs, sometimes leading to syncope. She does not recall the last occurrence. Her ability to walk longer distances has improved, with less leg pain.  She is not currently using supplemental oxygen and has not checked her oxygen levels recently, although she has a device at home for this purpose. She has not obtained a CT scan disc from [facility] as previously discussed.  She does not currently use a rescue inhaler like albuterol  but sometimes needs additional relief during activities such as cleaning.     Past Medical History:  Diagnosis Date   Anemia    Anxiety    Breast cancer (HCC)    tripple neg, chemo, radiation, b/l mastectomy follows in [facility]   Breast disorder    Depression    H/O echocardiogram    TTE 10/14/2023: EF 60-65, no RWMA, GR 1 DD, GLS -22.4, normal RVSF, normal PASP, RVSP 15.4, RAP 3   Hx of adenomatous polyp of colon 10/25/2023   Lymphedema    post chemo   Osteopenia    Reactive depression 06/16/2017   Shortness of breath on exertion      Family History  Problem Relation Age of Onset   Diabetes Mother    Breast cancer  Mother    Ovarian cancer Mother    Hypertension Mother    Hyperlipidemia Mother    Depression Mother    Sleep apnea Mother    Melanoma Father    Hypertension Father    Hyperlipidemia Father    Diabetes Father    Cancer Father    Sleep apnea Father    Hypertension Sister    Depression Sister    Hypertension Sister    Heart disease Sister    Depression Sister    Heart attack  Maternal Grandmother    Breast cancer Paternal Grandmother    Colon cancer Neg Hx    Colon polyps Neg Hx    Esophageal cancer Neg Hx    Rectal cancer Neg Hx    Stomach cancer Neg Hx      Social History   Occupational History   Occupation: Human Resourcees  Tobacco Use   Smoking status: Never   Smokeless tobacco: Never  Vaping Use   Vaping status: Never Used  Substance and Sexual Activity   Alcohol use: Yes    Alcohol/week: 2.0 standard drinks of alcohol    Types: 2 Standard drinks or equivalent per week    Comment: rarely   Drug use: No   Sexual activity: Yes    Partners: Male    Birth control/protection: Surgical    No Known Allergies   Outpatient Medications Prior to Visit  Medication Sig Dispense Refill   Accu-Chek FastClix Lancets MISC USE TO CHECK BLOOD SUGAR DAILY AND AS NEEDED 100 each 4   Accu-Chek FastClix Lancets MISC Use to check blood sugar daily and as needed. 102 each 4   cyclobenzaprine  (FLEXERIL ) 10 MG tablet Take 1 tablet (10 mg total) by mouth at bedtime. 90 tablet 1   Fezolinetant  (VEOZAH ) 45 MG TABS Take 1 tablet (45 mg total) by mouth daily. Must do liver enzymes q month for refill. She has [employer] that helps with medication costs 30 tablet 0   gabapentin  (NEURONTIN ) 300 MG capsule Take 1 capsule (300 mg total) by mouth in the morning AND 2 capsules (600 mg total) at bedtime. 360 capsule 1   glucose blood (ACCU-CHEK GUIDE TEST) test strip USE TO CHECK BLOOD SUGAR DAILY AND AS NEEDED 100 each 4   glucose blood test strip Use to check blood sugar daily and as needed. 100 each 4   Ibuprofen-Acetaminophen  (DUAL ACTION PAIN RELIEF PO) Take by mouth.     LORazepam  (ATIVAN ) 1 MG tablet Take 0.5-1 tablets (0.5-1 mg total) by mouth at bedtime as needed. for sleep 30 tablet 1   metoprolol  succinate (TOPROL  XL) 25 MG 24 hr tablet Take 1 tablet (25 mg total) by mouth in the morning and at bedtime. 60 tablet 0   nitrofurantoin , macrocrystal-monohydrate, (MACROBID )  100 MG capsule Take 1 capsule (100 mg total) by mouth at bedtime. 30 capsule 2   Prasterone  (INTRAROSA ) 6.5 MG INST Place 1 suppository vaginally at bedtime as needed. 30 each 12   QUEtiapine  (SEROQUEL ) 25 MG tablet TAKE 1 TABLET BY MOUTH EVERYDAY AT BEDTIME 90 tablet 1   QUEtiapine  (SEROQUEL ) 25 MG tablet Take 1 tablet (25 mg total) by mouth at bedtime. 90 tablet 1   Semaglutide , 2 MG/DOSE, (OZEMPIC , 2 MG/DOSE,) 8 MG/3ML SOPN Inject 2 mg into the skin once a week. 3 mL 2   sertraline  (ZOLOFT ) 100 MG tablet Take 1 tablet (100 mg total) by mouth daily. 90 tablet 2   traMADol  (ULTRAM ) 50 MG tablet Take  1 tablet (50 mg total) by mouth every 6 (six) hours as needed. 30 tablet 1   zolpidem  (AMBIEN ) 10 MG tablet Take 1 tablet (10 mg total) by mouth at bedtime as needed. 90 tablet 0   budesonide -formoterol  (SYMBICORT ) 80-4.5 MCG/ACT inhaler Inhale 2 puffs into the lungs 2 (two) times daily. 1 each 2   albuterol  (VENTOLIN  HFA) 108 (90 Base) MCG/ACT inhaler Inhale into the lungs. (Patient not taking: Reported on 11/03/2024)     Facility-Administered Medications Prior to Visit  Medication Dose Route Frequency Provider Last Rate Last Admin   denosumab  (PROLIA ) injection 60 mg  60 mg Subcutaneous Q6 months Glennon [name] POUR, MD        Review of Systems  Constitutional:  Negative for chills, diaphoresis, fever, malaise/fatigue and weight loss.  HENT:  Negative for congestion.   Respiratory:  Positive for shortness of breath. Negative for cough, hemoptysis, sputum production and wheezing.   Cardiovascular:  Negative for chest pain (chest tightness), palpitations and leg swelling.     Objective:   Vitals:   11/03/24 1120  BP: 124/78  Pulse: 87  SpO2: 100%  Weight: 129 lb 9.6 oz (58.8 kg)  Height: 5' 2 (1.575 m)   SpO2: 100 %  Physical Exam: General: Well-appearing, no acute distress HENT: [place], AT Eyes: EOMI, no scleral icterus Respiratory: Clear to auscultation bilaterally.  No crackles,  wheezing or rales Cardiovascular: RRR, -M/R/G, no JVD Extremities:-Edema,-tenderness Neuro: AAO x4, CNII-XII grossly intact Psych: Normal mood, normal affect  Data Reviewed:  Imaging: CT CAP 06/02/24 at [facility] Mild diffuse bilateral bronchial wall thickening.  Minimal subpleural radiation fibrosis in the anterior right lung. No  pleural effusion or pneumothorax.   PFT: 08/24/24 FVC 2.93 (90%) FEV1 1.97 (77%) Ratio 67  TLC 102% DLCO 100% Interpretation: Mild obstructive defect with significant bronchodilator response   Labs: CBC    Component Value Date/Time   WBC 4.4 07/10/2024 0839   RBC 3.88 07/10/2024 0839   HGB 12.4 07/10/2024 0839   HGB 13.8 01/24/2021 1625   HCT 37.6 07/10/2024 0839   HCT 40.6 01/24/2021 1625   PLT 234.0 07/10/2024 0839   PLT 359 01/24/2021 1625   MCV 96.8 07/10/2024 0839   MCV 94 01/24/2021 1625   MCH 32.7 05/15/2024 1801   MCHC 33.0 07/10/2024 0839   RDW 12.7 07/10/2024 0839   RDW 11.6 (L) 01/24/2021 1625   LYMPHSABS 1.3 07/10/2024 0839   LYMPHSABS 2.6 01/24/2021 1625   MONOABS 0.3 07/10/2024 0839   EOSABS 0.1 07/10/2024 0839   EOSABS 0.2 01/24/2021 1625   BASOSABS 0.0 07/10/2024 0839   BASOSABS 0.1 01/24/2021 1625        Assessment & Plan:   Discussion: 53 year old female with hx breast cancer s/p chemoradiation who presents with longstanding shortness of breath >3 years. Associated with chest tightness and worsened with exertion. PFTs consistent with asthma. Improved on ICS/LABA.  Discussed clinical course and management of asthma including bronchodilator regimen and action plan for exacerbation.    Assessment & Plan  Mild persistent asthma Chronic dyspnea post-cancer treatment with syncope and hypoxemia. Improvement with Symbicort  - CONTINUE generic Symbicort  160-4.5 mcg TWO puffs every morning and night. - CONTINUE Albuterol  1-2 puffs AS NEEDED for shortness of breath, wheezing and chest tightness  Mild bronchial thickening  and minimal fibrosis on CT - Will request CT CAP 06/02/24 from [facility] to be pushed - Consider CT chest in July 2026 to monitor for changes in scarring.  Syncope  Not hypoxemia on evaluation at last visit. Uncontrolled asthma initially thought to be contributing to it however still having syncope despite improved respiratory symptoms. Low suspicion that asthma is driving or preceding syncopal episodes   Health Maintenance Immunization History  Administered Date(s) Administered   Influenza, Mdck, Trivalent,PF 6+ MOS(egg free) 08/26/2024   Influenza-Unspecified 09/11/2023   MMRV 09/17/2023   Moderna Sars-Covid-2 Vaccination 07/26/2021, 10/18/2024   PFIZER(Purple Top)SARS-COV-2 Vaccination 02/22/2020, 03/14/2020, 10/27/2020   Pneumococcal Conjugate Pcv21, Polysaccharide Crm197 Conjugaf 10/18/2024   Tdap 07/18/2018   CT Lung Screen - not qualified  No orders of the defined types were placed in this encounter.  Meds ordered this encounter  Medications   budesonide -formoterol  (SYMBICORT ) 80-4.5 MCG/ACT inhaler    Sig: Inhale 2 puffs into the lungs 2 (two) times daily.    Dispense:  10.2 g    Refill:  5    Generic only   albuterol  (VENTOLIN  HFA) 108 (90 Base) MCG/ACT inhaler    Sig: Inhale 1-2 puffs into the lungs every 4 (four) hours as needed for wheezing or shortness of breath.    Dispense:  6.7 g    Refill:  1    Return in about 6 months (around 05/04/2025).  I have spent a total time of 35-minutes on the day of the appointment including chart review, data review, collecting history, coordinating care and discussing medical diagnosis and plan with the patient/family. Past medical history, allergies, medications were reviewed. Pertinent imaging, labs and tests included in this note have been reviewed and interpreted independently by me. This note is generated using Abridge programming. Patient has given consent.  Ngoc Detjen Slater Staff, MD [facility] 11/03/2024 11:57 AM

## 2024-11-04 ENCOUNTER — Other Ambulatory Visit: Payer: Self-pay | Admitting: Physician Assistant

## 2024-11-04 ENCOUNTER — Other Ambulatory Visit (HOSPITAL_BASED_OUTPATIENT_CLINIC_OR_DEPARTMENT_OTHER): Payer: Self-pay

## 2024-11-04 ENCOUNTER — Telehealth: Payer: Self-pay | Admitting: Physician Assistant

## 2024-11-04 DIAGNOSIS — Z1507 Genetic susceptibility to malignant neoplasm of urinary tract: Secondary | ICD-10-CM

## 2024-11-04 MED ORDER — METOPROLOL SUCCINATE ER 25 MG PO TB24
25.0000 mg | ORAL_TABLET | Freq: Two times a day (BID) | ORAL | 0 refills | Status: AC
  Filled 2024-11-04: qty 180, 90d supply, fill #0

## 2024-11-04 NOTE — Telephone Encounter (Signed)
°*  STAT* If patient is at the pharmacy, call can be transferred to refill team.   1. Which medications need to be refilled? (please list name of each medication and dose if known) metoprolol  succinate (TOPROL  XL) 25 MG 24 hr tablet    2. Would you like to learn more about the convenience, safety, & potential cost savings by using the [facility]?    3. Are you open to using the [facility] (Type [facility].  ).   4. Which pharmacy/location (including street and city if local pharmacy) is medication to be sent to? [facility]    5. Do they need a 30 day or 90 day supply? 90 day

## 2024-11-04 NOTE — Telephone Encounter (Signed)
 Pt scheduled to see Glendia Ferrier, PA-C, 01/19/25.  Refill sent.

## 2024-11-11 ENCOUNTER — Ambulatory Visit: Admitting: Obstetrics and Gynecology

## 2024-11-14 ENCOUNTER — Other Ambulatory Visit (HOSPITAL_BASED_OUTPATIENT_CLINIC_OR_DEPARTMENT_OTHER): Payer: Self-pay

## 2024-11-21 ENCOUNTER — Other Ambulatory Visit: Payer: Self-pay

## 2024-11-21 ENCOUNTER — Other Ambulatory Visit (HOSPITAL_BASED_OUTPATIENT_CLINIC_OR_DEPARTMENT_OTHER): Payer: Self-pay

## 2024-11-23 ENCOUNTER — Other Ambulatory Visit (HOSPITAL_BASED_OUTPATIENT_CLINIC_OR_DEPARTMENT_OTHER): Payer: Self-pay

## 2024-11-24 ENCOUNTER — Other Ambulatory Visit (HOSPITAL_BASED_OUTPATIENT_CLINIC_OR_DEPARTMENT_OTHER): Payer: Self-pay

## 2024-11-25 ENCOUNTER — Other Ambulatory Visit (HOSPITAL_BASED_OUTPATIENT_CLINIC_OR_DEPARTMENT_OTHER): Payer: Self-pay

## 2024-11-25 MED ORDER — BUDESONIDE-FORMOTEROL FUMARATE 80-4.5 MCG/ACT IN AERO
2.0000 | INHALATION_SPRAY | Freq: Two times a day (BID) | RESPIRATORY_TRACT | 5 refills | Status: AC
  Filled 2024-12-14: qty 10.2, 30d supply, fill #0

## 2024-12-07 NOTE — Progress Notes (Unsigned)
 "  Chief Complaint: Discuss EGD  HPI:    Carrie Richards is a 54 year old Caucasian female, known to Dr. Avram, with a past medical history as listed below including diabetes on Ozempic  anxiety, breast cancer status post chemo and radiation, depression, osteopenia and multiple others, who was referred to me by Job Lukes, PA for discussion of an EGD.    10/22/2023 colonoscopy with 1 diminutive polyp in the transverse colon.  Pathology showed tubular adenoma and repeat recommended in 7 years.    09/17/2024 labs with a CMP showing a calcium  minimally elevated at 10-1/2, albumin elevated 5.2 and otherwise normal.  Normal vitamin D .    09/29/24 normal labs including ANA, CRP, rheumatoid factor, ESR, hemoglobin A1c.    11/03/2024 patient followed with pulmonology at that time discussed some episodes of syncope in addition to her mild persistent asthma.  Was not thought that hypoxemia was leading to these episodes.  Past Medical History:  Diagnosis Date   Anemia    Anxiety    Breast cancer (HCC)    tripple neg, chemo, radiation, b/l mastectomy follows in [facility]   Breast disorder    Depression    H/O echocardiogram    TTE 10/14/2023: EF 60-65, no RWMA, GR 1 DD, GLS -22.4, normal RVSF, normal PASP, RVSP 15.4, RAP 3   Hx of adenomatous polyp of colon 10/25/2023   Lymphedema    post chemo   Osteopenia    Reactive depression 06/16/2017   Shortness of breath on exertion     Past Surgical History:  Procedure Laterality Date   ABDOMINAL HYSTERECTOMY     [facility] desires annual pap smears   BREAST RECONSTRUCTION Bilateral    BREAST RECONSTRUCTION  04/2024   clean up of breast reconstruction   BREAST RECONSTRUCTION  11/15/2023   placed aerolas   BROW LIFT     CESAREAN SECTION     CHOLECYSTECTOMY     COLONOSCOPY  09/2023   MASTECTOMY Bilateral 02/07/2022   WRIST SURGERY Right     Current Outpatient Medications  Medication Sig Dispense Refill   Accu-Chek FastClix Lancets MISC  USE TO CHECK BLOOD SUGAR DAILY AND AS NEEDED 100 each 4   Accu-Chek FastClix Lancets MISC Use to check blood sugar daily and as needed. 102 each 4   albuterol  (VENTOLIN  HFA) 108 (90 Base) MCG/ACT inhaler Inhale 1-2 puffs into the lungs every 4 (four) hours as needed for wheezing or shortness of breath. 6.7 g 1   budesonide -formoterol  (SYMBICORT ) 80-4.5 MCG/ACT inhaler Inhale 2 puffs into the lungs 2 (two) times daily. 10.2 g 5   cyclobenzaprine  (FLEXERIL ) 10 MG tablet Take 1 tablet (10 mg total) by mouth at bedtime. 90 tablet 1   Fezolinetant  (VEOZAH ) 45 MG TABS Take 1 tablet (45 mg total) by mouth daily. Must do liver enzymes q month for refill. She has [employer] that helps with medication costs 30 tablet 0   gabapentin  (NEURONTIN ) 300 MG capsule Take 1 capsule (300 mg total) by mouth in the morning AND 2 capsules (600 mg total) at bedtime. 360 capsule 1   glucose blood (ACCU-CHEK GUIDE TEST) test strip USE TO CHECK BLOOD SUGAR DAILY AND AS NEEDED 100 each 4   glucose blood test strip Use to check blood sugar daily and as needed. 100 each 4   Ibuprofen-Acetaminophen  (DUAL ACTION PAIN RELIEF PO) Take by mouth.     LORazepam  (ATIVAN ) 1 MG tablet Take 0.5-1 tablets (0.5-1 mg total) by mouth at bedtime as needed.  for sleep 30 tablet 1   metoprolol  succinate (TOPROL  XL) 25 MG 24 hr tablet Take 1 tablet (25 mg total) by mouth in the morning and at bedtime. 180 tablet 0   nitrofurantoin , macrocrystal-monohydrate, (MACROBID ) 100 MG capsule Take 1 capsule (100 mg total) by mouth at bedtime. 30 capsule 2   Prasterone  (INTRAROSA ) 6.5 MG INST Place 1 suppository vaginally at bedtime as needed. 30 each 12   QUEtiapine  (SEROQUEL ) 25 MG tablet TAKE 1 TABLET BY MOUTH EVERYDAY AT BEDTIME 90 tablet 1   QUEtiapine  (SEROQUEL ) 25 MG tablet Take 1 tablet (25 mg total) by mouth at bedtime. 90 tablet 1   Semaglutide , 2 MG/DOSE, (OZEMPIC , 2 MG/DOSE,) 8 MG/3ML SOPN Inject 2 mg into the skin once a week. 3 mL 2   sertraline   (ZOLOFT ) 100 MG tablet Take 1 tablet (100 mg total) by mouth daily. 90 tablet 2   traMADol  (ULTRAM ) 50 MG tablet Take 1 tablet (50 mg total) by mouth every 6 (six) hours as needed. 30 tablet 1   zolpidem  (AMBIEN ) 10 MG tablet Take 1 tablet (10 mg total) by mouth at bedtime as needed. 90 tablet 0   Current Facility-Administered Medications  Medication Dose Route Frequency Provider Last Rate Last Admin   denosumab  (PROLIA ) injection 60 mg  60 mg Subcutaneous Q6 months Glennon [name] POUR, MD        Allergies as of 12/08/2024   (No Known Allergies)    Family History  Problem Relation Age of Onset   Diabetes Mother    Breast cancer Mother    Ovarian cancer Mother    Hypertension Mother    Hyperlipidemia Mother    Depression Mother    Sleep apnea Mother    Melanoma Father    Hypertension Father    Hyperlipidemia Father    Diabetes Father    Cancer Father    Sleep apnea Father    Hypertension Sister    Depression Sister    Hypertension Sister    Heart disease Sister    Depression Sister    Heart attack Maternal Grandmother    Breast cancer Paternal Grandmother    Colon cancer Neg Hx    Colon polyps Neg Hx    Esophageal cancer Neg Hx    Rectal cancer Neg Hx    Stomach cancer Neg Hx     Social History   Socioeconomic History   Marital status: Married    Spouse name: Medford   Number of children: 5   Years of education: Not on file   Highest education level: Master's degree (e.g., MA, MS, MEng, MEd, MSW, MBA)  Occupational History   Occupation: Human Resourcees  Tobacco Use   Smoking status: Never   Smokeless tobacco: Never  Vaping Use   Vaping status: Never Used  Substance and Sexual Activity   Alcohol use: Yes    Alcohol/week: 2.0 standard drinks of alcohol    Types: 2 Standard drinks or equivalent per week    Comment: rarely   Drug use: No   Sexual activity: Yes    Partners: Male    Birth control/protection: Surgical  Other Topics Concern   Not on file   Social History Narrative   Not on file   Social Drivers of Health   Tobacco Use: Low Risk (11/03/2024)   Patient History    Smoking Tobacco Use: Never    Smokeless Tobacco Use: Never    Passive Exposure: Not on file  Financial Resource Strain: Low Risk (  09/23/2023)   Received from [facility]   Overall Financial Resource Strain (CARDIA)    Difficulty of Paying Living Expenses: Not hard at all  Food Insecurity: No Food Insecurity (09/23/2023)   Received from [facility]   Epic    Within the past 12 months, you worried that your food would run out before you got the money to buy more.: Never true    Within the past 12 months, the food you bought just didn't last and you didn't have money to get more.: Never true  Transportation Needs: No Transportation Needs (09/23/2023)   Received from [facility] - Transportation    Lack of Transportation (Medical): No    Lack of Transportation (Non-Medical): No  Physical Activity: Unknown (09/16/2023)   Exercise Vital Sign    Days of Exercise per Week: Patient declined    Minutes of Exercise per Session: Not on file  Stress: Stress Concern Present (09/16/2023)   [employer] - Occupational Stress Questionnaire    Feeling of Stress : Very much  Social Connections: Socially Integrated (09/23/2023)   Received from [facility]   Social Network    How would you rate your social network (family, work, friends)?: Good participation with social networks  Intimate Partner Violence: Not At Risk (09/23/2023)   Received from [facility]   HITS    Over the last 12 months how often did your partner physically hurt you?: Never    Over the last 12 months how often did your partner insult you or talk down to you?: Never    Over the last 12 months how often did your partner threaten you with physical harm?: Never    Over the last 12 months how often did your partner scream or curse at you?: Never  Depression  (PHQ2-9): Low Risk (09/17/2024)   Depression (PHQ2-9)    PHQ-2 Score: 1  Recent Concern: Depression (PHQ2-9) - High Risk (07/10/2024)   Depression (PHQ2-9)    PHQ-2 Score: 13  Alcohol Screen: Low Risk (09/16/2023)   Alcohol Screen    Last Alcohol Screening Score (AUDIT): 2  Housing: Low Risk (09/16/2023)   Housing    Last Housing Risk Score: 0  Utilities: Not At Risk (09/23/2023)   Received from [facility] Utilities    Threatened with loss of utilities: No  Health Literacy: Not on file    Review of Systems:    Constitutional: No weight loss, fever, chills, weakness or fatigue HEENT: Eyes: No change in vision               Ears, Nose, Throat:  No change in hearing or congestion Skin: No rash or itching Cardiovascular: No chest pain, chest pressure or palpitations   Respiratory: No SOB or cough Gastrointestinal: See HPI and otherwise negative Genitourinary: No dysuria or change in urinary frequency Neurological: No headache, dizziness or syncope Musculoskeletal: No new muscle or joint pain Hematologic: No bleeding or bruising Psychiatric: No history of depression or anxiety    Physical Exam:  Vital signs: LMP  (LMP Unknown)   Constitutional:   Pleasant Caucasian female appears to be in NAD, Well developed, Well nourished, alert and cooperative Head:  Normocephalic and atraumatic. Eyes:   PEERL, EOMI. No icterus. Conjunctiva pink. Ears:  Normal auditory acuity. Neck:  Supple Throat: Oral cavity and pharynx without inflammation, swelling or lesion.  Respiratory: Respirations even and unlabored. Lungs clear to auscultation bilaterally.   No wheezes, crackles,  or rhonchi.  Cardiovascular: Normal S1, S2. No MRG. Regular rate and rhythm. No peripheral edema, cyanosis or pallor.  Gastrointestinal:  Soft, nondistended, nontender. No rebound or guarding. Normal bowel sounds. No appreciable masses or hepatomegaly. Rectal:  Not performed.  Msk:  Symmetrical without gross  deformities. Without edema, no deformity or joint abnormality.  Neurologic:  Alert and  oriented x4;  grossly normal neurologically.  Skin:   Dry and intact without significant lesions or rashes. Psychiatric: Oriented to person, place and time. Demonstrates good judgement and reason without abnormal affect or behaviors.  RELEVANT LABS AND IMAGING: CBC    Component Value Date/Time   WBC 4.4 07/10/2024 0839   RBC 3.88 07/10/2024 0839   HGB 12.4 07/10/2024 0839   HGB 13.8 01/24/2021 1625   HCT 37.6 07/10/2024 0839   HCT 40.6 01/24/2021 1625   PLT 234.0 07/10/2024 0839   PLT 359 01/24/2021 1625   MCV 96.8 07/10/2024 0839   MCV 94 01/24/2021 1625   MCH 32.7 05/15/2024 1801   MCHC 33.0 07/10/2024 0839   RDW 12.7 07/10/2024 0839   RDW 11.6 (L) 01/24/2021 1625   LYMPHSABS 1.3 07/10/2024 0839   LYMPHSABS 2.6 01/24/2021 1625   MONOABS 0.3 07/10/2024 0839   EOSABS 0.1 07/10/2024 0839   EOSABS 0.2 01/24/2021 1625   BASOSABS 0.0 07/10/2024 0839   BASOSABS 0.1 01/24/2021 1625    CMP     Component Value Date/Time   NA 142 09/17/2024 1133   NA 140 01/24/2021 1625   K 4.7 09/17/2024 1133   CL 104 09/17/2024 1133   CO2 31 09/17/2024 1133   GLUCOSE 80 09/17/2024 1133   BUN 17 09/17/2024 1133   BUN 12 01/24/2021 1625   CREATININE 0.71 09/17/2024 1133   CALCIUM  10.5 (H) 09/17/2024 1133   CALCIUM  10.2 09/17/2024 1133   PROT 6.8 09/17/2024 1133   PROT 6.5 01/24/2021 1625   ALBUMIN 4.4 07/10/2024 0839   ALBUMIN 4.6 01/24/2021 1625   AST 12 09/17/2024 1133   ALT 9 09/17/2024 1133   ALKPHOS 74 07/10/2024 0839   BILITOT 0.3 09/17/2024 1133   BILITOT 0.4 01/24/2021 1625   GFRNONAA >60 05/15/2024 1801   GFRAA >90 12/03/2012 1941    Assessment: 1. ***  Plan: 1. ***     Delon Failing, PA-C [facility] 12/07/2024, 8:50 AM  Cc: Job Lukes, [place]  "

## 2024-12-08 ENCOUNTER — Ambulatory Visit: Admitting: Physician Assistant

## 2024-12-10 ENCOUNTER — Encounter: Payer: Self-pay | Admitting: Physician Assistant

## 2024-12-11 ENCOUNTER — Other Ambulatory Visit (HOSPITAL_BASED_OUTPATIENT_CLINIC_OR_DEPARTMENT_OTHER): Payer: Self-pay

## 2024-12-14 ENCOUNTER — Other Ambulatory Visit (HOSPITAL_BASED_OUTPATIENT_CLINIC_OR_DEPARTMENT_OTHER): Payer: Self-pay | Admitting: Pulmonary Disease

## 2024-12-14 ENCOUNTER — Other Ambulatory Visit (HOSPITAL_BASED_OUTPATIENT_CLINIC_OR_DEPARTMENT_OTHER): Payer: Self-pay

## 2024-12-24 ENCOUNTER — Ambulatory Visit: Admitting: Obstetrics and Gynecology

## 2024-12-25 ENCOUNTER — Emergency Department (HOSPITAL_BASED_OUTPATIENT_CLINIC_OR_DEPARTMENT_OTHER)
Admission: EM | Admit: 2024-12-25 | Discharge: 2024-12-25 | Disposition: A | Attending: Emergency Medicine | Admitting: Emergency Medicine

## 2024-12-25 ENCOUNTER — Other Ambulatory Visit: Payer: Self-pay

## 2024-12-25 ENCOUNTER — Other Ambulatory Visit: Payer: Self-pay | Admitting: *Deleted

## 2024-12-25 ENCOUNTER — Ambulatory Visit: Admitting: Obstetrics and Gynecology

## 2024-12-25 ENCOUNTER — Encounter (HOSPITAL_BASED_OUTPATIENT_CLINIC_OR_DEPARTMENT_OTHER): Payer: Self-pay

## 2024-12-25 ENCOUNTER — Encounter: Payer: Self-pay | Admitting: Physician Assistant

## 2024-12-25 DIAGNOSIS — Z79899 Other long term (current) drug therapy: Secondary | ICD-10-CM | POA: Insufficient documentation

## 2024-12-25 DIAGNOSIS — R195 Other fecal abnormalities: Secondary | ICD-10-CM | POA: Insufficient documentation

## 2024-12-25 DIAGNOSIS — I10 Essential (primary) hypertension: Secondary | ICD-10-CM | POA: Diagnosis not present

## 2024-12-25 DIAGNOSIS — Z794 Long term (current) use of insulin: Secondary | ICD-10-CM | POA: Diagnosis not present

## 2024-12-25 DIAGNOSIS — E119 Type 2 diabetes mellitus without complications: Secondary | ICD-10-CM | POA: Diagnosis not present

## 2024-12-25 DIAGNOSIS — Z853 Personal history of malignant neoplasm of breast: Secondary | ICD-10-CM | POA: Insufficient documentation

## 2024-12-25 HISTORY — DX: Type 2 diabetes mellitus without complications: E11.9

## 2024-12-25 HISTORY — DX: Unspecified asthma, uncomplicated: J45.909

## 2024-12-25 LAB — CBC
HCT: 38.4 % (ref 36.0–46.0)
Hemoglobin: 12.5 g/dL (ref 12.0–15.0)
MCH: 31.1 pg (ref 26.0–34.0)
MCHC: 32.6 g/dL (ref 30.0–36.0)
MCV: 95.5 fL (ref 80.0–100.0)
Platelets: 248 10*3/uL (ref 150–400)
RBC: 4.02 MIL/uL (ref 3.87–5.11)
RDW: 11.9 % (ref 11.5–15.5)
WBC: 5.2 10*3/uL (ref 4.0–10.5)
nRBC: 0 % (ref 0.0–0.2)

## 2024-12-25 LAB — COMPREHENSIVE METABOLIC PANEL WITH GFR
ALT: 11 U/L (ref 0–44)
AST: 16 U/L (ref 15–41)
Albumin: 4.7 g/dL (ref 3.5–5.0)
Alkaline Phosphatase: 88 U/L (ref 38–126)
Anion gap: 6 (ref 5–15)
BUN: 17 mg/dL (ref 6–20)
CO2: 31 mmol/L (ref 22–32)
Calcium: 9.7 mg/dL (ref 8.9–10.3)
Chloride: 102 mmol/L (ref 98–111)
Creatinine, Ser: 0.75 mg/dL (ref 0.44–1.00)
GFR, Estimated: >60 mL/min
Glucose, Bld: 77 mg/dL (ref 70–99)
Potassium: 4.5 mmol/L (ref 3.5–5.1)
Sodium: 139 mmol/L (ref 135–145)
Total Bilirubin: 0.3 mg/dL (ref 0.0–1.2)
Total Protein: 6.4 g/dL — ABNORMAL LOW (ref 6.5–8.1)

## 2024-12-25 LAB — URINALYSIS, ROUTINE W REFLEX MICROSCOPIC
Bilirubin Urine: NEGATIVE
Glucose, UA: NEGATIVE mg/dL
Hgb urine dipstick: NEGATIVE
Ketones, ur: NEGATIVE mg/dL
Leukocytes,Ua: NEGATIVE
Nitrite: NEGATIVE
Protein, ur: NEGATIVE mg/dL
Specific Gravity, Urine: >=1.03 (ref 1.005–1.030)
pH: 6 (ref 5.0–8.0)

## 2024-12-25 LAB — LIPASE, BLOOD: Lipase: 45 U/L (ref 11–51)

## 2024-12-25 LAB — OCCULT BLOOD X 1 CARD TO LAB, STOOL: Fecal Occult Bld: NEGATIVE

## 2024-12-25 MED ORDER — OZEMPIC (2 MG/DOSE) 8 MG/3ML ~~LOC~~ SOPN: 2.0000 mg | PEN_INJECTOR | SUBCUTANEOUS | 1 refills | Status: AC

## 2024-12-25 NOTE — ED Triage Notes (Signed)
 Pt states that she has been having dark tarry stools since yesterday. States that her PCP sent her to the ED for further evaluation. States that she is concerned that she may have bowel issues inside due. Pt also is complaining headache and nausea.

## 2024-12-25 NOTE — Discharge Instructions (Addendum)
 It was a pleasure taking care of you today. You were seen in the Emergency Department for eval ration of dark stools for 2 days. Your work-up was reassuring. Your Labs showed no acute abnormality or evidence of blood in your stool.  As we discussed, it is possible that being ibuprofen that may have aggravated your stomach lining, causing some blood in your bowel movements.  However, there was no indication of that during your workup today.  As we discussed, please keep your follow-up appointment with GI for next week.  In the meantime, I recommend staying away from all anti-inflammatories.  These are medications like ibuprofen, Advil, Aleve , Goody powder.  You may continue to take Tylenol  and the other medications you have been prescribed.   Please return to the ER if you experience chest pain, trouble breathing, intractable nausea/vomiting or any other life threatening illnesses.

## 2024-12-25 NOTE — ED Provider Notes (Signed)
 " [facility] Provider Note   CSN: [id number] Arrival date & time: 12/25/24  1734     Patient presents with: multiple complaints   Carrie Richards is a 54 y.o. female with past medical history of GERD, chronic UTI on antibiotics, hyperlipidemia, breast cancer, diabetes, hypertension, chronic pain, who presents emergency department for evaluation of both dark as well as bloody stools.  Patient reports she first noticed this yesterday.  She is endorsing blood both mixed in her stool and when she wipes.  She does not have a history of hemorrhoids.  Patient was advised to come to the emergency department for further workup by her PCP.  She does report she has an appointment to follow-up with GI next week, but was advised not to wait.   HPI     Prior to Admission medications  Medication Sig Start Date End Date Taking? Authorizing Provider  Accu-Chek FastClix Lancets MISC USE TO CHECK BLOOD SUGAR DAILY AND AS NEEDED 05/11/24   Job Lukes, PA  Accu-Chek FastClix Lancets MISC Use to check blood sugar daily and as needed. 05/11/24   Job Lukes, PA  albuterol  (VENTOLIN  HFA) 108 (90 Base) MCG/ACT inhaler Inhale 1-2 puffs into the lungs every 4 (four) hours as needed for wheezing or shortness of breath. 11/03/24   Kassie Acquanetta Bradley, MD  budesonide -formoterol  (SYMBICORT ) 80-4.5 MCG/ACT inhaler Inhale 2 puffs into the lungs 2 (two) times daily. 11/25/24   Kassie Acquanetta Bradley, MD  cyclobenzaprine  (FLEXERIL ) 10 MG tablet Take 1 tablet (10 mg total) by mouth at bedtime. 07/10/24   Job Lukes, PA  Fezolinetant  (VEOZAH ) 45 MG TABS Take 1 tablet (45 mg total) by mouth daily. Must do liver enzymes q month for refill. She has Claudene FATE that helps with medication costs 09/17/24   Glennon [name] POUR, MD  gabapentin  (NEURONTIN ) 300 MG capsule Take 1 capsule (300 mg total) by mouth in the morning AND 2 capsules (600 mg total) at bedtime. 04/13/24     glucose  blood (ACCU-CHEK GUIDE TEST) test strip USE TO CHECK BLOOD SUGAR DAILY AND AS NEEDED 05/11/24   Job Lukes, PA  glucose blood test strip Use to check blood sugar daily and as needed. 05/11/24   Job Lukes, PA  Ibuprofen-Acetaminophen  (DUAL ACTION PAIN RELIEF PO) Take by mouth.    [provider]  LORazepam  (ATIVAN ) 1 MG tablet Take 0.5-1 tablets (0.5-1 mg total) by mouth at bedtime as needed. for sleep 11/07/23   Job Lukes, PA  metoprolol  succinate (TOPROL  XL) 25 MG 24 hr tablet Take 1 tablet (25 mg total) by mouth in the morning and at bedtime. 11/04/24   Lelon Hamilton T, PA-C  nitrofurantoin , macrocrystal-monohydrate, (MACROBID ) 100 MG capsule Take 1 capsule (100 mg total) by mouth at bedtime. 10/14/24   Job Lukes, PA  Prasterone  (INTRAROSA ) 6.5 MG INST Place 1 suppository vaginally at bedtime as needed. 09/17/24   Glennon [name] POUR, MD  QUEtiapine  (SEROQUEL ) 25 MG tablet TAKE 1 TABLET BY MOUTH EVERYDAY AT BEDTIME 10/09/24   Job Lukes, PA  QUEtiapine  (SEROQUEL ) 25 MG tablet Take 1 tablet (25 mg total) by mouth at bedtime. 10/09/24   Job Lukes, PA  Semaglutide , 2 MG/DOSE, (OZEMPIC , 2 MG/DOSE,) 8 MG/3ML SOPN Inject 2 mg into the skin once a week. 12/25/24   Job Lukes, PA  sertraline  (ZOLOFT ) 100 MG tablet Take 1 tablet (100 mg total) by mouth daily. 02/05/24   Job Lukes, PA  traMADol  (ULTRAM ) 50  MG tablet Take 1 tablet (50 mg total) by mouth every 6 (six) hours as needed. 07/10/24   Job Lukes, PA  zolpidem  (AMBIEN ) 10 MG tablet Take 1 tablet (10 mg total) by mouth at bedtime as needed. 10/09/24   Job Lukes, PA    Allergies: Patient has no known allergies.    Review of Systems  Gastrointestinal:  Positive for blood in stool.    Updated Vital Signs BP 122/68 (BP Location: Left Arm)   Pulse 80   Temp 97.8 F (36.6 C) (Oral)   Resp 16   Ht 5' 2 (1.575 m)   Wt 54.4 kg   LMP  (LMP Unknown)   SpO2 97%   BMI 21.95  kg/m   Physical Exam Vitals and nursing note reviewed.  Constitutional:      Appearance: Normal appearance.  HENT:     Head: Normocephalic and atraumatic.     Mouth/Throat:     Mouth: Mucous membranes are moist.  Eyes:     General: No scleral icterus.       Right eye: No discharge.        Left eye: No discharge.     Conjunctiva/sclera: Conjunctivae normal.  Cardiovascular:     Rate and Rhythm: Normal rate and regular rhythm.     Pulses: Normal pulses.  Pulmonary:     Effort: Pulmonary effort is normal.     Breath sounds: Normal breath sounds.  Abdominal:     General: There is no distension.     Tenderness: There is no abdominal tenderness.  Genitourinary:    Rectum: Guaiac result negative.  Musculoskeletal:        General: No deformity.     Cervical back: Normal range of motion.  Skin:    General: Skin is warm and dry.     Capillary Refill: Capillary refill takes less than 2 seconds.  Neurological:     Mental Status: She is alert.     Motor: No weakness.  Psychiatric:        Mood and Affect: Mood normal.     (all labs ordered are listed, but only abnormal results are displayed) Labs Reviewed  COMPREHENSIVE METABOLIC PANEL WITH GFR - Abnormal; Notable for the following components:      Result Value   Total Protein 6.4 (*)    All other components within normal limits  LIPASE, BLOOD  CBC  URINALYSIS, ROUTINE W REFLEX MICROSCOPIC  OCCULT BLOOD X 1 CARD TO LAB, STOOL    EKG: None  Radiology: No results found.  Procedures   Medications Ordered in the ED - No data to display    Medical Decision Making Amount and/or Complexity of Data Reviewed Labs: ordered.   This patient presents to the ED for concern of blood in her stool, this involves an extensive number of treatment options, and is a complaint that carries with it a high risk of complications and morbidity.   Differential diagnosis includes: Upper GI bleed, lower GIB, diverticulitis, diverticulosis,  foreign body, rectal mass, hemorrhoids, anal fissure  Co morbidities:  see above  Additional history:  Patient reached out to her PCP today, and was advised to go to urgent care for evaluation of rectal bleeding and associated nausea.  She reported to the emergency department after receiving that message.  Lab Tests:  I Ordered, and personally interpreted labs.  The pertinent results include: No acute abnormalities to explain patient's symptoms  Imaging Studies:  Imaging is not indicated at this time  Cardiac Monitoring/ECG:  The patient was maintained on a cardiac monitor.  I personally viewed and interpreted the cardiac monitored which showed an underlying rhythm of: Sinus rhythm  Medicines ordered and prescription drug management:  I did offer the patient pain medicine and nausea medicine, however she declined  Test Considered:   none  Critical Interventions:   none  Consultations Obtained: None  Problem List / ED Course:     ICD-10-CM   1. Dark stools  R19.5       MDM: 54 year old female who presents the emergency department for evaluation of reported dark black as well as bright red bloody stools x 1 day.  Patient has been experiencing a daily headache for the last 2 weeks, and has reportedly been taking ibuprofen.  She is also taking Flexeril  and tramadol  on for her history of chronic pain.  She has never had an issue with GI bleeding or peptic ulcer disease in the past.  Her workup in the ER was reassuring.  Her hemoglobin was normal.  There was no sign of infection to suggest diverticulitis as the source of her symptoms.  Her Hemoccult was ultimately negative.  She does have a follow-up appointment with GI already scheduled for next week.  I did advise that she keep this appointment and obtain a further workup.  Patient verbalized her understanding to this.  I did give the patient strict return precautions should she notice blood in her stool again.  Patient's  vitals are stable.  Patient is appropriate for discharge at this time.   Dispostion:  After consideration of the diagnostic results and the patients response to treatment, I feel that the patient would benefit from supportive care and GI follow-up.   Final diagnoses:  Dark stools    ED Discharge Orders     None          Torrence Marry RAMAN, [place] 12/25/24 2314  "

## 2024-12-26 NOTE — ED Notes (Signed)
 Pt states she has bee having bloody BM x2 days with some episodes of nausea. Denied any other symptoms at this time.

## 2024-12-29 ENCOUNTER — Ambulatory Visit: Admitting: Physician Assistant

## 2025-01-19 ENCOUNTER — Ambulatory Visit: Admitting: Physician Assistant

## 2025-01-26 ENCOUNTER — Ambulatory Visit: Admitting: Gastroenterology

## 2025-01-28 ENCOUNTER — Ambulatory Visit: Admitting: Obstetrics and Gynecology
